# Patient Record
Sex: Female | Born: 1940
Health system: Southern US, Community
[De-identification: ages and names within clinical notes are randomized; demographics above are authoritative.]

## PROBLEM LIST (undated history)

## (undated) DIAGNOSIS — N3090 Cystitis, unspecified without hematuria: Secondary | ICD-10-CM

## (undated) DIAGNOSIS — C50911 Malignant neoplasm of unspecified site of right female breast: Secondary | ICD-10-CM

## (undated) DIAGNOSIS — F419 Anxiety disorder, unspecified: Secondary | ICD-10-CM

## (undated) DIAGNOSIS — R55 Syncope and collapse: Secondary | ICD-10-CM

## (undated) DIAGNOSIS — M1990 Unspecified osteoarthritis, unspecified site: Secondary | ICD-10-CM

## (undated) DIAGNOSIS — I341 Nonrheumatic mitral (valve) prolapse: Secondary | ICD-10-CM

## (undated) DIAGNOSIS — E079 Disorder of thyroid, unspecified: Secondary | ICD-10-CM

## (undated) DIAGNOSIS — R112 Nausea with vomiting, unspecified: Secondary | ICD-10-CM

## (undated) DIAGNOSIS — R42 Dizziness and giddiness: Secondary | ICD-10-CM

## (undated) DIAGNOSIS — M818 Other osteoporosis without current pathological fracture: Secondary | ICD-10-CM

## (undated) DIAGNOSIS — T50905A Adverse effect of unspecified drugs, medicaments and biological substances, initial encounter: Principal | ICD-10-CM

## (undated) DIAGNOSIS — Z803 Family history of malignant neoplasm of breast: Secondary | ICD-10-CM

## (undated) DIAGNOSIS — T7840XA Allergy, unspecified, initial encounter: Secondary | ICD-10-CM

## (undated) DIAGNOSIS — I10 Essential (primary) hypertension: Secondary | ICD-10-CM

## (undated) DIAGNOSIS — Z9889 Other specified postprocedural states: Secondary | ICD-10-CM

## (undated) DIAGNOSIS — C50919 Malignant neoplasm of unspecified site of unspecified female breast: Secondary | ICD-10-CM

## (undated) DIAGNOSIS — C801 Malignant (primary) neoplasm, unspecified: Secondary | ICD-10-CM

## (undated) DIAGNOSIS — E785 Hyperlipidemia, unspecified: Secondary | ICD-10-CM

## (undated) DIAGNOSIS — H269 Unspecified cataract: Secondary | ICD-10-CM

## (undated) DIAGNOSIS — K219 Gastro-esophageal reflux disease without esophagitis: Secondary | ICD-10-CM

## (undated) DIAGNOSIS — Z460 Encounter for fitting and adjustment of spectacles and contact lenses: Secondary | ICD-10-CM

## (undated) DIAGNOSIS — I890 Lymphedema, not elsewhere classified: Secondary | ICD-10-CM

## (undated) HISTORY — DX: Gastro-esophageal reflux disease without esophagitis: K21.9

## (undated) HISTORY — PX: MASTECTOMY: SHX3

## (undated) HISTORY — DX: Malignant neoplasm of unspecified site of right female breast: C50.911

## (undated) HISTORY — PX: UPPER GASTROINTESTINAL ENDOSCOPY: SHX188

## (undated) HISTORY — PX: KNEE ARTHROSCOPY: SHX127

## (undated) HISTORY — PX: BREAST LUMPECTOMY: SHX2

## (undated) HISTORY — PX: FOOT NEUROMA SURGERY: SHX646

## (undated) HISTORY — DX: Nonrheumatic mitral (valve) prolapse: I34.1

## (undated) HISTORY — PX: FOOT AMPUTATION: SHX951

## (undated) HISTORY — DX: Unspecified osteoarthritis, unspecified site: M19.90

## (undated) HISTORY — DX: Hyperlipidemia, unspecified: E78.5

## (undated) HISTORY — DX: Essential (primary) hypertension: I10

## (undated) HISTORY — DX: Cystitis, unspecified without hematuria: N30.90

## (undated) HISTORY — DX: Other osteoporosis without current pathological fracture: M81.8

## (undated) HISTORY — DX: Allergy, unspecified, initial encounter: T78.40XA

## (undated) HISTORY — DX: Disorder of thyroid, unspecified: E07.9

## (undated) HISTORY — DX: Malignant neoplasm of unspecified site of unspecified female breast: C50.919

## (undated) HISTORY — PX: MASTECTOMY, RADICAL: SHX710

## (undated) HISTORY — DX: Adverse effect of unspecified drugs, medicaments and biological substances, initial encounter: T50.905A

## (undated) HISTORY — DX: Malignant (primary) neoplasm, unspecified: C80.1

## (undated) HISTORY — DX: Lymphedema, not elsewhere classified: I89.0

## (undated) HISTORY — DX: Family history of malignant neoplasm of breast: Z80.3

## (undated) HISTORY — PX: CATARACT EXTRACTION, BILATERAL: SHX1313

## (undated) HISTORY — PX: THYROIDECTOMY, PARTIAL: SHX18

## (undated) HISTORY — DX: Unspecified cataract: H26.9

---
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
## 1990-07-16 DIAGNOSIS — C50919 Malignant neoplasm of unspecified site of unspecified female breast: Secondary | ICD-10-CM

## 1990-07-16 HISTORY — DX: Malignant neoplasm of unspecified site of unspecified female breast: C50.919

## 1999-04-01 ENCOUNTER — Other Ambulatory Visit: Admission: RE | Admit: 1999-04-01 | Discharge: 1999-04-01 | Payer: Self-pay | Admitting: Obstetrics & Gynecology

## 1999-12-25 ENCOUNTER — Encounter: Admission: RE | Admit: 1999-12-25 | Discharge: 1999-12-25 | Payer: Self-pay | Admitting: Oncology

## 1999-12-25 ENCOUNTER — Encounter: Payer: Self-pay | Admitting: Oncology

## 2000-03-25 ENCOUNTER — Other Ambulatory Visit: Admission: RE | Admit: 2000-03-25 | Discharge: 2000-03-25 | Payer: Self-pay | Admitting: Obstetrics and Gynecology

## 2000-04-08 ENCOUNTER — Ambulatory Visit (HOSPITAL_COMMUNITY): Admission: RE | Admit: 2000-04-08 | Discharge: 2000-04-08 | Payer: Self-pay | Admitting: Orthopedic Surgery

## 2000-09-10 ENCOUNTER — Ambulatory Visit (HOSPITAL_COMMUNITY): Admission: RE | Admit: 2000-09-10 | Discharge: 2000-09-10 | Payer: Self-pay | Admitting: *Deleted

## 2000-12-28 ENCOUNTER — Encounter: Payer: Self-pay | Admitting: Oncology

## 2000-12-28 ENCOUNTER — Encounter: Admission: RE | Admit: 2000-12-28 | Discharge: 2000-12-28 | Payer: Self-pay | Admitting: Oncology

## 2001-03-30 ENCOUNTER — Other Ambulatory Visit: Admission: RE | Admit: 2001-03-30 | Discharge: 2001-03-30 | Payer: Self-pay | Admitting: Obstetrics and Gynecology

## 2001-06-11 ENCOUNTER — Encounter (INDEPENDENT_AMBULATORY_CARE_PROVIDER_SITE_OTHER): Payer: Self-pay | Admitting: Specialist

## 2001-06-11 ENCOUNTER — Other Ambulatory Visit: Admission: RE | Admit: 2001-06-11 | Discharge: 2001-06-11 | Payer: Self-pay | Admitting: Obstetrics and Gynecology

## 2001-12-27 ENCOUNTER — Encounter: Admission: RE | Admit: 2001-12-27 | Discharge: 2001-12-27 | Payer: Self-pay | Admitting: Oncology

## 2001-12-27 ENCOUNTER — Encounter: Payer: Self-pay | Admitting: Oncology

## 2002-04-04 ENCOUNTER — Other Ambulatory Visit: Admission: RE | Admit: 2002-04-04 | Discharge: 2002-04-04 | Payer: Self-pay | Admitting: Obstetrics and Gynecology

## 2003-04-10 ENCOUNTER — Other Ambulatory Visit: Admission: RE | Admit: 2003-04-10 | Discharge: 2003-04-10 | Payer: Self-pay | Admitting: Obstetrics and Gynecology

## 2004-04-09 ENCOUNTER — Other Ambulatory Visit: Admission: RE | Admit: 2004-04-09 | Discharge: 2004-04-09 | Payer: Self-pay | Admitting: Obstetrics and Gynecology

## 2004-11-17 HISTORY — PX: COLONOSCOPY: SHX174

## 2004-12-23 ENCOUNTER — Ambulatory Visit: Payer: Self-pay | Admitting: Oncology

## 2005-04-15 ENCOUNTER — Other Ambulatory Visit: Admission: RE | Admit: 2005-04-15 | Discharge: 2005-04-15 | Payer: Self-pay | Admitting: Obstetrics and Gynecology

## 2005-07-25 ENCOUNTER — Inpatient Hospital Stay (HOSPITAL_COMMUNITY): Admission: EM | Admit: 2005-07-25 | Discharge: 2005-07-28 | Payer: Self-pay | Admitting: Emergency Medicine

## 2005-10-17 ENCOUNTER — Ambulatory Visit (HOSPITAL_COMMUNITY): Admission: RE | Admit: 2005-10-17 | Discharge: 2005-10-17 | Payer: Self-pay | Admitting: *Deleted

## 2005-10-17 ENCOUNTER — Encounter (INDEPENDENT_AMBULATORY_CARE_PROVIDER_SITE_OTHER): Payer: Self-pay | Admitting: Specialist

## 2005-12-22 ENCOUNTER — Ambulatory Visit: Payer: Self-pay | Admitting: Oncology

## 2006-04-16 ENCOUNTER — Emergency Department (HOSPITAL_COMMUNITY): Admission: EM | Admit: 2006-04-16 | Discharge: 2006-04-16 | Payer: Self-pay | Admitting: Emergency Medicine

## 2006-12-17 ENCOUNTER — Ambulatory Visit: Payer: Self-pay | Admitting: Oncology

## 2006-12-22 LAB — CBC WITH DIFFERENTIAL/PLATELET
Eosinophils Absolute: 0.1 10*3/uL (ref 0.0–0.5)
HCT: 33.9 % — ABNORMAL LOW (ref 34.8–46.6)
LYMPH%: 30.4 % (ref 14.0–48.0)
MONO#: 0.3 10*3/uL (ref 0.1–0.9)
NEUT#: 4.1 10*3/uL (ref 1.5–6.5)
NEUT%: 63.3 % (ref 39.6–76.8)
Platelets: 171 10*3/uL (ref 145–400)
WBC: 6.4 10*3/uL (ref 3.9–10.0)

## 2006-12-22 LAB — COMPREHENSIVE METABOLIC PANEL
CO2: 26 mEq/L (ref 19–32)
Calcium: 9.3 mg/dL (ref 8.4–10.5)
Chloride: 106 mEq/L (ref 96–112)
Creatinine, Ser: 1.2 mg/dL (ref 0.40–1.20)
Glucose, Bld: 130 mg/dL — ABNORMAL HIGH (ref 70–99)
Sodium: 142 mEq/L (ref 135–145)
Total Bilirubin: 0.4 mg/dL (ref 0.3–1.2)
Total Protein: 6.7 g/dL (ref 6.0–8.3)

## 2006-12-22 LAB — LACTATE DEHYDROGENASE: LDH: 143 U/L (ref 94–250)

## 2007-05-27 ENCOUNTER — Emergency Department (HOSPITAL_COMMUNITY): Admission: EM | Admit: 2007-05-27 | Discharge: 2007-05-27 | Payer: Self-pay | Admitting: Emergency Medicine

## 2007-12-31 ENCOUNTER — Ambulatory Visit: Payer: Self-pay | Admitting: Oncology

## 2008-01-04 LAB — CBC WITH DIFFERENTIAL/PLATELET
Basophils Absolute: 0.1 10*3/uL (ref 0.0–0.1)
Eosinophils Absolute: 0.1 10*3/uL (ref 0.0–0.5)
HGB: 12.2 g/dL (ref 11.6–15.9)
LYMPH%: 24.6 % (ref 14.0–48.0)
MCV: 89.3 fL (ref 81.0–101.0)
MONO%: 5.3 % (ref 0.0–13.0)
NEUT#: 4.5 10*3/uL (ref 1.5–6.5)
Platelets: 188 10*3/uL (ref 145–400)
RBC: 4.01 10*6/uL (ref 3.70–5.32)

## 2008-01-04 LAB — COMPREHENSIVE METABOLIC PANEL
Alkaline Phosphatase: 46 U/L (ref 39–117)
BUN: 30 mg/dL — ABNORMAL HIGH (ref 6–23)
Creatinine, Ser: 1.36 mg/dL — ABNORMAL HIGH (ref 0.40–1.20)
Glucose, Bld: 117 mg/dL — ABNORMAL HIGH (ref 70–99)
Total Bilirubin: 0.6 mg/dL (ref 0.3–1.2)

## 2008-01-27 ENCOUNTER — Encounter: Admission: RE | Admit: 2008-01-27 | Discharge: 2008-01-27 | Payer: Self-pay | Admitting: Radiology

## 2008-02-16 DIAGNOSIS — C50911 Malignant neoplasm of unspecified site of right female breast: Secondary | ICD-10-CM

## 2008-02-16 HISTORY — DX: Malignant neoplasm of unspecified site of right female breast: C50.911

## 2008-03-02 ENCOUNTER — Encounter: Admission: RE | Admit: 2008-03-02 | Discharge: 2008-03-02 | Payer: Self-pay | Admitting: General Surgery

## 2008-03-06 ENCOUNTER — Encounter (INDEPENDENT_AMBULATORY_CARE_PROVIDER_SITE_OTHER): Payer: Self-pay | Admitting: General Surgery

## 2008-03-06 ENCOUNTER — Ambulatory Visit (HOSPITAL_BASED_OUTPATIENT_CLINIC_OR_DEPARTMENT_OTHER): Admission: RE | Admit: 2008-03-06 | Discharge: 2008-03-06 | Payer: Self-pay | Admitting: General Surgery

## 2008-03-23 ENCOUNTER — Ambulatory Visit (HOSPITAL_COMMUNITY): Admission: RE | Admit: 2008-03-23 | Discharge: 2008-03-24 | Payer: Self-pay | Admitting: General Surgery

## 2008-03-23 ENCOUNTER — Encounter (INDEPENDENT_AMBULATORY_CARE_PROVIDER_SITE_OTHER): Payer: Self-pay | Admitting: General Surgery

## 2008-04-05 ENCOUNTER — Ambulatory Visit: Payer: Self-pay | Admitting: Oncology

## 2008-05-22 ENCOUNTER — Ambulatory Visit: Payer: Self-pay | Admitting: Oncology

## 2008-06-20 LAB — COMPREHENSIVE METABOLIC PANEL
AST: 19 U/L (ref 0–37)
Albumin: 4.1 g/dL (ref 3.5–5.2)
Alkaline Phosphatase: 51 U/L (ref 39–117)
BUN: 26 mg/dL — ABNORMAL HIGH (ref 6–23)
Potassium: 4.3 mEq/L (ref 3.5–5.3)
Sodium: 140 mEq/L (ref 135–145)

## 2008-06-20 LAB — CBC WITH DIFFERENTIAL/PLATELET
BASO%: 0.5 % (ref 0.0–2.0)
Basophils Absolute: 0 10*3/uL (ref 0.0–0.1)
EOS%: 1.9 % (ref 0.0–7.0)
MCH: 30.4 pg (ref 26.0–34.0)
MCHC: 33.9 g/dL (ref 32.0–36.0)
MCV: 89.7 fL (ref 81.0–101.0)
MONO%: 4.4 % (ref 0.0–13.0)
RBC: 3.91 10*6/uL (ref 3.70–5.32)
RDW: 13.1 % (ref 11.3–14.5)

## 2008-07-02 ENCOUNTER — Emergency Department (HOSPITAL_COMMUNITY): Admission: EM | Admit: 2008-07-02 | Discharge: 2008-07-02 | Payer: Self-pay | Admitting: Emergency Medicine

## 2008-08-17 ENCOUNTER — Ambulatory Visit: Payer: Self-pay | Admitting: Oncology

## 2008-09-06 LAB — BASIC METABOLIC PANEL
BUN: 24 mg/dL — ABNORMAL HIGH (ref 6–23)
CO2: 29 mEq/L (ref 19–32)
Chloride: 106 mEq/L (ref 96–112)
Creatinine, Ser: 1.15 mg/dL (ref 0.40–1.20)
Glucose, Bld: 117 mg/dL — ABNORMAL HIGH (ref 70–99)

## 2009-01-08 ENCOUNTER — Ambulatory Visit: Payer: Self-pay | Admitting: Oncology

## 2009-01-10 LAB — CBC WITH DIFFERENTIAL/PLATELET
BASO%: 0.6 % (ref 0.0–2.0)
EOS%: 1.7 % (ref 0.0–7.0)
HCT: 36.1 % (ref 34.8–46.6)
LYMPH%: 27 % (ref 14.0–49.7)
MCH: 30.6 pg (ref 25.1–34.0)
MCHC: 34.1 g/dL (ref 31.5–36.0)
NEUT%: 63.5 % (ref 38.4–76.8)
Platelets: 179 10*3/uL (ref 145–400)
RBC: 4.03 10*6/uL (ref 3.70–5.45)
lymph#: 1.8 10*3/uL (ref 0.9–3.3)

## 2009-01-10 LAB — COMPREHENSIVE METABOLIC PANEL
ALT: 16 U/L (ref 0–35)
AST: 19 U/L (ref 0–37)
Creatinine, Ser: 1.26 mg/dL — ABNORMAL HIGH (ref 0.40–1.20)
Sodium: 140 mEq/L (ref 135–145)
Total Bilirubin: 0.5 mg/dL (ref 0.3–1.2)

## 2009-03-05 ENCOUNTER — Ambulatory Visit: Payer: Self-pay | Admitting: Oncology

## 2009-04-13 ENCOUNTER — Ambulatory Visit: Payer: Self-pay | Admitting: Oncology

## 2009-04-18 LAB — CBC WITH DIFFERENTIAL/PLATELET
BASO%: 0.6 % (ref 0.0–2.0)
HCT: 33.3 % — ABNORMAL LOW (ref 34.8–46.6)
LYMPH%: 26.3 % (ref 14.0–49.7)
MCHC: 34.4 g/dL (ref 31.5–36.0)
MCV: 90 fL (ref 79.5–101.0)
MONO#: 0.5 10*3/uL (ref 0.1–0.9)
MONO%: 7.1 % (ref 0.0–14.0)
NEUT%: 64.8 % (ref 38.4–76.8)
Platelets: 176 10*3/uL (ref 145–400)
RBC: 3.7 10*6/uL (ref 3.70–5.45)
WBC: 7.2 10*3/uL (ref 3.9–10.3)

## 2009-04-18 LAB — COMPREHENSIVE METABOLIC PANEL
ALT: 14 U/L (ref 0–35)
AST: 19 U/L (ref 0–37)
Albumin: 4.1 g/dL (ref 3.5–5.2)
Alkaline Phosphatase: 41 U/L (ref 39–117)
Potassium: 4.6 mEq/L (ref 3.5–5.3)
Sodium: 140 mEq/L (ref 135–145)
Total Bilirubin: 0.6 mg/dL (ref 0.3–1.2)
Total Protein: 6.7 g/dL (ref 6.0–8.3)

## 2009-08-06 ENCOUNTER — Encounter: Admission: RE | Admit: 2009-08-06 | Discharge: 2009-08-21 | Payer: Self-pay | Admitting: General Surgery

## 2009-10-19 ENCOUNTER — Ambulatory Visit: Payer: Self-pay | Admitting: Oncology

## 2009-10-23 LAB — CBC WITH DIFFERENTIAL/PLATELET
Basophils Absolute: 0 10*3/uL (ref 0.0–0.1)
Eosinophils Absolute: 0.1 10*3/uL (ref 0.0–0.5)
HGB: 12.3 g/dL (ref 11.6–15.9)
MCV: 93.6 fL (ref 79.5–101.0)
MONO#: 0.4 10*3/uL (ref 0.1–0.9)
MONO%: 6.2 % (ref 0.0–14.0)
NEUT#: 3.6 10*3/uL (ref 1.5–6.5)
Platelets: 149 10*3/uL (ref 145–400)
RBC: 3.85 10*6/uL (ref 3.70–5.45)
RDW: 13.6 % (ref 11.2–14.5)
WBC: 5.8 10*3/uL (ref 3.9–10.3)

## 2009-10-23 LAB — COMPREHENSIVE METABOLIC PANEL
Albumin: 4.3 g/dL (ref 3.5–5.2)
Alkaline Phosphatase: 35 U/L — ABNORMAL LOW (ref 39–117)
BUN: 24 mg/dL — ABNORMAL HIGH (ref 6–23)
CO2: 27 mEq/L (ref 19–32)
Calcium: 10 mg/dL (ref 8.4–10.5)
Glucose, Bld: 108 mg/dL — ABNORMAL HIGH (ref 70–99)
Potassium: 4.3 mEq/L (ref 3.5–5.3)
Sodium: 141 mEq/L (ref 135–145)
Total Protein: 6.9 g/dL (ref 6.0–8.3)

## 2009-10-23 LAB — LACTATE DEHYDROGENASE: LDH: 176 U/L (ref 94–250)

## 2010-01-28 ENCOUNTER — Encounter: Admission: RE | Admit: 2010-01-28 | Discharge: 2010-01-28 | Payer: Self-pay | Admitting: Oncology

## 2010-05-01 ENCOUNTER — Ambulatory Visit: Payer: Self-pay | Admitting: Oncology

## 2010-05-03 LAB — BASIC METABOLIC PANEL
BUN: 32 mg/dL — ABNORMAL HIGH (ref 6–23)
CO2: 24 mEq/L (ref 19–32)
Calcium: 9.7 mg/dL (ref 8.4–10.5)
Chloride: 106 mEq/L (ref 96–112)
Creatinine, Ser: 1.3 mg/dL — ABNORMAL HIGH (ref 0.40–1.20)
Glucose, Bld: 98 mg/dL (ref 70–99)
Potassium: 4.5 mEq/L (ref 3.5–5.3)
Sodium: 142 mEq/L (ref 135–145)

## 2010-10-17 ENCOUNTER — Ambulatory Visit: Payer: Self-pay | Admitting: Oncology

## 2010-10-21 LAB — CBC WITH DIFFERENTIAL/PLATELET
BASO%: 0.6 % (ref 0.0–2.0)
Basophils Absolute: 0 10*3/uL (ref 0.0–0.1)
EOS%: 1.9 % (ref 0.0–7.0)
Eosinophils Absolute: 0.1 10*3/uL (ref 0.0–0.5)
HCT: 35 % (ref 34.8–46.6)
HGB: 12 g/dL (ref 11.6–15.9)
LYMPH%: 28.9 % (ref 14.0–49.7)
MCH: 31.8 pg (ref 25.1–34.0)
MCHC: 34.3 g/dL (ref 31.5–36.0)
MCV: 92.7 fL (ref 79.5–101.0)
MONO#: 0.5 10*3/uL (ref 0.1–0.9)
MONO%: 7.9 % (ref 0.0–14.0)
NEUT#: 3.8 10*3/uL (ref 1.5–6.5)
NEUT%: 60.7 % (ref 38.4–76.8)
Platelets: 165 10*3/uL (ref 145–400)
RBC: 3.78 10*6/uL (ref 3.70–5.45)
RDW: 13.3 % (ref 11.2–14.5)
WBC: 6.3 10*3/uL (ref 3.9–10.3)
lymph#: 1.8 10*3/uL (ref 0.9–3.3)

## 2010-11-15 LAB — BASIC METABOLIC PANEL
BUN: 25 mg/dL — ABNORMAL HIGH (ref 6–23)
CO2: 26 mEq/L (ref 19–32)
Glucose, Bld: 94 mg/dL (ref 70–99)
Potassium: 4.3 mEq/L (ref 3.5–5.3)

## 2011-01-16 ENCOUNTER — Encounter (INDEPENDENT_AMBULATORY_CARE_PROVIDER_SITE_OTHER): Payer: Self-pay | Admitting: *Deleted

## 2011-01-23 NOTE — Letter (Signed)
Summary: Pre Visit Letter Revised  [facility]  [address] [place], [place] [address]   Phone: [phone]  Fax: [phone]        01/16/2011 MRN: [id number] Jennifer Yang [address] [place], [place]  [address]             Procedure Date:  02-27-11           Direct Colon--Dr. Russella Dar   Welcome to the Gastroenterology Division at [facility].    You are scheduled to see a nurse for your pre-procedure visit on 02-13-11 at 10:00a.m. on the 3rd floor at [facility], [address]  We ask that you try to arrive at our office 15 minutes prior to your appointment time to allow for check-in.  Please take a minute to review the attached form.  If you answer "Yes" to one or more of the questions on the first page, we ask that you call the person listed at your earliest opportunity.  If you answer "No" to all of the questions, please complete the rest of the form and bring it to your appointment.    Your nurse visit will consist of discussing your medical and surgical history, your immediate family medical history, and your medications.   If you are unable to list all of your medications on the form, please bring the medication bottles to your appointment and we will list them.  We will need to be aware of both prescribed and over the counter drugs.  We will need to know exact dosage information as well.    Please be prepared to read and sign documents such as consent forms, a financial agreement, and acknowledgement forms.  If necessary, and with your consent, a friend or relative is welcome to sit-in on the nurse visit with you.  Please bring your insurance card so that we may make a copy of it.  If your insurance requires a referral to see a specialist, please bring your referral form from your primary care physician.  No co-pay is required for this nurse visit.     If you cannot keep your appointment, please call [phone] to cancel or reschedule prior to your  appointment date.  This allows [place] the opportunity to schedule an appointment for another patient in need of care.    Thank you for choosing [facility] for your medical needs.  We appreciate the opportunity to care for you.  Please visit [place] at our website  to learn more about our practice.  Sincerely, The Gastroenterology Division

## 2011-02-13 ENCOUNTER — Telehealth: Payer: Self-pay | Admitting: *Deleted

## 2011-02-13 ENCOUNTER — Ambulatory Visit (AMBULATORY_SURGERY_CENTER): Payer: Medicare Other | Admitting: *Deleted

## 2011-02-13 VITALS — Ht 69.0 in | Wt 219.0 lb

## 2011-02-13 DIAGNOSIS — Z1211 Encounter for screening for malignant neoplasm of colon: Secondary | ICD-10-CM

## 2011-02-13 MED ORDER — PEG-KCL-NACL-NASULF-NA ASC-C 100 G PO SOLR: 1.0000 | Freq: Once | ORAL | Status: AC

## 2011-02-13 NOTE — Telephone Encounter (Signed)
Pt. Notified that her colon is not due until 2016/ Dr. Ardell Isaacs advice.  Pt. States she does hemoccult stools annually with her Gyn doctor.

## 2011-02-13 NOTE — Telephone Encounter (Signed)
The current guidelines for colorectal cancer screening for average risk patients with no symptoms is every 10 years, which would be 10/2015. We, or her PCP, could order stool hemmocults and if they are positive a colonoscopy would be indicated.

## 2011-02-13 NOTE — Telephone Encounter (Signed)
Dr. Russella Dar, Ms. Jennifer Yang had a colon in 2006 with Dr. Virginia Rochester.  Since then she's had a radical mastectomy with lymph node removal.  She had a lumpectomy with chemo and radiation on the same breast in 2001.  My question is should she have one now?  Her last 2 colon were normal and no family history.  Please advise.  Dr. Wende Neighbors procedures are on your desk.

## 2011-02-13 NOTE — Progress Notes (Signed)
Right arm lymphedema.  No blood pressure or IV's in right arm

## 2011-02-27 ENCOUNTER — Other Ambulatory Visit: Payer: Self-pay | Admitting: Gastroenterology

## 2011-03-28 ENCOUNTER — Encounter (INDEPENDENT_AMBULATORY_CARE_PROVIDER_SITE_OTHER): Payer: Self-pay | Admitting: General Surgery

## 2011-04-01 NOTE — Op Note (Signed)
Jennifer Yang, Jennifer Yang                 ACCOUNT NO.:  [id number]   MEDICAL RECORD NO.:  [id number]          PATIENT TYPE:  AMB   LOCATION:  DSC                          FACILITY:  [facility]   PHYSICIAN:  Angelia Mould. Derrell Lolling, M.D.DATE OF BIRTH:  1941-11-02   DATE OF PROCEDURE:  03/06/2008  DATE OF DISCHARGE:                               OPERATIVE REPORT   PREOPERATIVE DIAGNOSIS:  History right breast cancer, possible local  recurrence.   POSTOPERATIVE DIAGNOSIS:  History right breast cancer, possible local  recurrence.   OPERATION PERFORMED:  Excision of right breast tissue with needle  localization and specimen mammograms.   SURGEON:  Angelia Mould. Derrell Lolling, M.D.   OPERATIVE INDICATIONS:  This is a 70 year old white female who underwent  a right partial mastectomy and right axillary lymph node dissection in  1991 for a stage T1c N0, receptor positive tumor.  She has had no known  recurrence today.  Recent mammograms showed some stable architectural  distortion at the lumpectomy site.  She had subsequent mammograms,  ultrasound, MRI, and BSGI.  The only abnormality was some abnormal  uptake in the lumpectomy bed on the BSGI and some linear enhancement  within the lumpectomy bed on MRI.  A core biopsy was performed and there  was atypical ductal proliferation with findings that were worrisome for  a small focus of recurrent breast cancer.  The core biopsy was not  diagnostic.  She was evaluated as an outpatient and advised to have this  area excised for diagnostic purposes.  She is aware that if she has  recurrent cancer she will most likely need a mastectomy.  She underwent  needle localization at [facility] and is brought to operating  room today.   OPERATIVE TECHNIQUE:  Needle localization was performed this morning by  Dr. [facility].  The wire went through the area of the titanium  wire clips and went about 3 cm beyond.  The images showed the wire to be  through the area of  architectural distortion and wire clips, which is  directly behind the lumpectomy site at about the 6:30 p.m. position.  The patient was then brought to operating room.  General anesthesia was  induced.  The right breast was prepped and draped in a sterile fashion.  A 0.5% Marcaine with epinephrine was used as a local infiltration  anesthetic.   I observed the curved circumareolar incision in the lower breast.  There  was some deformity here.  I conservatively excised the incision and then  took the dissection down and around the wire tip.  I took the specimen  out and marked with silk sutures to mark the superior and lateral  margins.  Using the Faxitron, I took an image and found that I had both  titanium clips within the center of the specimen.  The specimen was then  sent to Dr. Cherlyn Labella office and she also did a specimen mammogram and  she called back and told me that I had good excision of the area.  The  specimen sent for routine histology.  The wound was  irrigated with  saline.  Hemostasis was excellent.  The deeper  tissues were closed with interrupted sutures of 3-0 Vicryl and skin  closed with running subcuticular suture of 4-0 Monocryl and Dermabond.  Clean bandage was placed and the patient taken recovery room in stable  condition.  Estimated blood loss was about 10 mL.  Complications, none.  Sponge, needle, and instrument counts were correct.      Angelia Mould. Derrell Lolling, M.D.  Electronically Signed     HMI/MEDQ  D:  03/06/2008  T:  03/07/2008  Job:  [id number]   cc:   Genene Churn. Cyndie Chime, M.D.

## 2011-04-01 NOTE — Op Note (Signed)
NAMESHEKETA, ENDE                 ACCOUNT NO.:  [id number]   MEDICAL RECORD NO.:  [id number]          PATIENT TYPE:  OIB   LOCATION:  6743                         FACILITY:  [facility]   PHYSICIAN:  Angelia Mould. Derrell Lolling, M.D.DATE OF BIRTH:  December 21, 1940   DATE OF PROCEDURE:  03/23/2008  DATE OF DISCHARGE:                               OPERATIVE REPORT   PREOPERATIVE DIAGNOSIS:  Recurrent cancer, right breast.   POSTOPERATIVE DIAGNOSIS:  Recurrent cancer, right breast.   OPERATION PERFORMED:  Right total mastectomy.   SURGEON:  Angelia Mould. Derrell Lolling, MD   FIRST ASSISTANT:  Anselm Pancoast. Zachery Dakins, MD   OPERATIVE INDICATIONS:  This is a 70 year old white female who underwent  a right partial mastectomy and right axillary lymph node dissection in  1991, for what turned out to be a stage T1C, N0, receptor positive  tumor.  She had adjuvant radiation therapy.  She has had no known  recurrence to date.  Recent mammogram showed stable architectural  distortion of the lumpectomy site and a 7-mm mass in the low axillary  region just anterior to the pectoralis.  This was thought to be a lymph  node, but nevertheless she went onto MRI and BSGI.  On the BSGI, there  was abnormal uptake in the lumpectomy bed extending in a line to the  nipple posteriorly for about 3 cm.  The MRI looked similar.  She had a  core biopsy and a clip placed and this showed atypical ductal  proliferation, worrisome for malignancy and excisional biopsy was  strongly recommended.  At that point, the patient was sent to me.  She  was taken to the operating room on March 06, 2008, for a needle-  localized excisional biopsy.  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  She was counseled  and advised to undergo of right mastectomy.  This has been discussed in  detail as an outpatient and she is brought to the hospital  electively  for that.   OPERATIVE TECHNIQUE:  Following induction of general endotracheal  anesthesia, the patient's right breast, shoulder, chest wall, and axilla  were prepped and draped in a sterile fashion.  Intravenous antibiotics  were given.  The patient was identified as correct patient and correct  procedure and correct site.  A transverse elliptical incision was made  extending well above the areola superiorly and well below the curved  transverse incision at the 7 o'clock position.  This took the incision  about 1 cm below the previous lumpectomy scar and biopsy site and took  it all the way down to the inframammary crease and a little bit below.  The transverse elliptical incision was made.  Skin flaps were raised  superiorly to below the clavicle, medially to the parasternal area,  inferiorly we are very careful to stay well away from biopsy cavity all  the way down to the anterior rectus sheath, and laterally to the  latissimus dorsi muscle.  The biopsy cavity was thin and extended  all  the way down to the pectoralis muscle at the inferior border.  The  breast was dissected off of the underlying pectoralis major and minor  using electrocautery.  We got to near the biopsy cavity site.  We went  deeper into the muscle to take more tissue.  There was no sign of any  cancer anywhere.  We continued the dissection, took the breast off  including the tail of Spence.  We marked the tail of Spence with a  suture and marked the inferior border margin with a suture.  Although,  the biopsy cavity was disrupted and that will be somewhat distorted in  the specimen.  I felt that clinically, we got widely around this area  without any concern for positive margin grossly.  The wound was  irrigated with saline.  Hemostasis was excellent and achieved with  electrocautery.  Two 19-French Blake drains were placed, one up into the  axillary area and one across the skin flaps.  These were brought  out  through separate stab incisions in the right lateral chest wall, sutured  the skin with nylon  sutures and connected to suction bulbs.  The skin was closed with skin  staples.  A clean occlusive bandage and Coban dressing was placed.  The  patient tolerated the procedure well and was taken to the recovery room  in stable condition.  Estimated blood loss was about 100 mL.  Complications none.  Sponge, needle, and counts were correct.      Angelia Mould. Derrell Lolling, M.D.  Electronically Signed     HMI/MEDQ  D:  03/23/2008  T:  03/23/2008  Job:  [id number]   cc:   Genene Churn. Cyndie Chime, M.D.  Jaclyn Prime. Lucas Mallow, M.D.

## 2011-04-04 NOTE — Discharge Summary (Signed)
NAMEVIHANA, Jennifer Yang                 ACCOUNT NO.:  [id number]   MEDICAL RECORD NO.:  [id number]          PATIENT TYPE:  INP   LOCATION:  5021                         FACILITY:  [facility]   PHYSICIAN:  Lonia Blood, M.D.DATE OF BIRTH:  06-Mar-1941   DATE OF ADMISSION:  07/25/2005  DATE OF DISCHARGE:  07/28/2005                                 DISCHARGE SUMMARY   DISCHARGE DIAGNOSIS:  1.  Right upper extremity cellulitis.      1.  Status post lymph node dissection that side.      2.  Responded to initial course of intravenous Azithromycin.      3.  Extended course p.o. Azithromycin to be completed in the outpatient          setting.  2.  Status post lumpectomy of the right breast with lymph node dissection.  3.  Prior history of cellulitis right upper extremity.  4.  Status post thyroidectomy for goiter.  5.  Status post surgery for Morton's neuroma and hammer toe.  6.  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 Lucas Mallow.   DISCHARGE MEDICATIONS:  1.  Tenormin 50 mg daily.  2.  Hyzaar 50 mg daily.  3.  E-Vista.  4.  Aspirin 81 mg daily.  5.  Multi-vitamin p.o. daily.  6.  Azithromycin 250 mg p.o. daily x 7 days then stop.   CONSULTATIONS:  None.   PROCEDURES:  None.   FOLLOW UP:  The patient is advised to keep her regularly scheduled follow up  appointments with Dr. Cyndie Chime and Dr. Lucas Mallow.  She is advised to return  to the hospital immediately if she should develop severe swelling of the  right arm, return of severe erythema or systemic fevers and chills.   HISTORY OF PRESENT ILLNESS:  For full details of the history of present  illness, please see dictated history and physical, number [id number], from Dr.  Hannah Yang.   HOSPITAL COURSE:  Jennifer Yang is a very pleasant 70 year old female with  a significant history of right breast lumpectomy and lymph node dissection  in the right axilla.  She presented to the hospital on July 25, 2005,  with  complaints of swelling in the right arm.  On exam, the right arm  displayed marked erythema and calor.  The patient was clinically diagnosed  with cellulitis.  Because of the complicating factor of the patient's  previous lymph node dissection, it was felt that inpatient treatment was  most appropriate.  The patient was placed on an acute unit.  Because of  reported allergies to Rocephin, sulfa, and codeine, the decision was made to  administer Azithromycin.  Azithromycin at 500 mg IV daily was provided on  July 25, 2005, through July 28, 2005.  The patient tolerated this  without any difficulty.  The patient's arm showed progressive improvement.  On the day of discharge, erythema had almost completely resolved but had  significantly receded from the prior marks at presentation as outlined in  blue ink.  The patient no longer had systemic symptoms.  She was  afebrile.  White blood  cell count had decreased to normal at 5.3 from a level of  11.3 at  presentation.  The patient was converted to p.o. Azithromycin and cleared  for discharge home.  She was educated on symptoms of returning cellulitis  and warned to report immediately for evaluation should these occur.      Lonia Blood, M.D.  Electronically Signed     JTM/MEDQ  D:  07/28/2005  T:  07/28/2005  Job:  [id number]   cc:   Jaclyn Prime. Lucas Mallow, M.D.  [address]  [place]  [place] [address]  Fax: [phone]   Genene Churn. Cyndie Chime, M.D.  [address] [facility]  [place]  [place] [address]  Fax: [phone]

## 2011-04-04 NOTE — H&P (Signed)
NAMENEELIE, WELSHANS NO.:  [id number]   MEDICAL RECORD NO.:  [id number]          PATIENT TYPE:  INP   LOCATION:  5021                         FACILITY:  [facility]   PHYSICIAN:  Hettie Holstein, D.O.    DATE OF BIRTH:  [birth year]   DATE OF ADMISSION:  07/25/2005  DATE OF DISCHARGE:                                HISTORY & PHYSICAL   PRIMARY CARE PHYSICIAN:  Jaclyn Prime. Lucas Mallow, M.D.   ONCOLOGIST:  Genene Churn. [place], M.D.   CHIEF COMPLAINT:  Swelling of her right arm and redness.   HISTORY OF PRESENT ILLNESS:  Mrs. Deboy is a pleasant 70 year old Caucasian  female who lives at home with husband. She has a previous history of right  lumpectomy in 1991 for breast cancer, in addition to lymph node resection  and has remained in her usual state of health until today. She had developed  some pain, redness, and erythema of her right upper extremity shortly after  working in her flower beds at home. She cannot recall any trauma or injury.  She presented to [facility] at which time she was given some  Benadryl. Subsequently, she presented to the emergency department at [facility]. She was evaluated by Dr. Doug Sou. He made a clinical  diagnosis of cellulitis. She is being admitted for further management.   PAST MEDICAL HISTORY:  As noted above, status post lumpectomy for breast  cancer. She has had cellulitis of this same arm several years ago for which  she has undergone treatment with Zithromax without problems. History of  partial thyroidectomy for goiter. History of Morton's neuroma and surgery at  that time, in addition to hammertoe. She has some valvular disease and is  followed by Dr. Lucas Mallow with periodic echocardiograms.   MEDICATIONS:  1.  Tenormin 50 mg daily.  2.  Hyzaar 50 mg daily.  3.  Evista.  4.  Multivitamins.  5.  Baby aspirin daily.  6.  Caltrate.   She is uncertain that these are the doses of her medications. She does  use  the [facility] on [facility] which can be contacted in the morning  to confirm. I believe that number is [phone].   ALLERGIES:  In any event, allergies include ROCEPHIN which she developed a  drop in her blood count previously according to her. She has an allergy to  SULFA and CODEINE.   SOCIAL HISTORY:  She does not smoke or drink. She lives with her husband.  They do have an indoor pet, which is a blood hound. She was formerly a  [employer] operational officer.   FAMILY HISTORY:  Her mother died at age 65 with breast cancer and also she  had diabetes and heart disease. Father died at age 37 with a myocardial  infarction. She does have a brother who had a MI at age 44.   REVIEW OF SYSTEMS:  She has had headaches; however, denies any nausea,  vomiting, diarrhea, chest pain, cough, shortness of breath. Some subjective  fevers. No abdominal pain. No swelling of her lower extremities.  PHYSICAL EXAMINATION:  VITAL SIGNS:  Stable in the emergency department with  temperature of 98.9, blood pressure 132/56, heart rate of 89, respirations  18. O2 saturation 96%.  GENERAL:  The patient is alert, oriented, in no acute distress.  NECK:  Supple and nontender. She does have neck line scar post-  thyroidectomy. Her neck is supple, however.  CARDIOVASCULAR:  Normal S1 and S2 without murmur or gallop.  LUNGS:  Clear. She exhibits normal effort and there is no dullness to  percussion.  ABDOMEN:  Soft and nontender. No palpable hepatosplenomegaly.  EXTREMITIES:  Her right upper extremity was warm and erythematous as well as  swollen. The area of erythema was demarcated with a skin marker. Peripheral  pulses were palpable. She had no focal motor or neurologic deficits on exam.   LABORATORY DATA:  From the [facility], a WBC of 11.3, hemoglobin  12.4, platelet count of 135,000.  MCV of 89.   ASSESSMENT:  1.  Cellulitis right upper extremity.  2.  History of breast cancer.  3.  Hypertension.   4.  History of valvular heart disease, not otherwise specified.  5.  Hypothyroidism.  6.  Multiple drug allergies.  7.  Low platelets on outpatient blood work.   PLAN:  We will admit Mrs. Wyeth to medical/surgical floor. Initiate IV  Zithromax. Check a CBC with a differential in the a.m. as well as a basic  metabolic panel and follow her clinical course. May need to change  antibiotics if we do not see regression with Zithromax.      Hettie Holstein, D.O.  Electronically Signed     ESS/MEDQ  D:  07/25/2005  T:  07/26/2005  Job:  [id number]   cc:   Jaclyn Prime. Lucas Mallow, M.D.  [address]  [place]  [place] [address]  Fax: [phone]   Genene Churn. Cyndie Chime, M.D.  [address] [facility]  [place]  [place] [address]  Fax: [phone]

## 2011-04-04 NOTE — Procedures (Signed)
[facility]  Patient:    Jennifer Yang, Jennifer Yang                        MRN: [id number] Proc. Date: 09/10/00 Adm. Date:  [id number] Attending:  Sabino Gasser                           Procedure Report  PROCEDURE:  Colonoscopy.  INDICATIONS:  Change in bowel habits, diarrhea, colon cancer screening.  ANESTHESIA:  Demerol 50 mg, Versed 7 mg.  DESCRIPTION OF PROCEDURE:  With the patient mildly sedated in the left lateral decubitus position, the Olympus videoscopic colonoscope was inserted in the rectum and passed under direct vision to the cecum.  Cecum identified by ileocecal valve and appendiceal orifice, both of which were photographed. From this point, the colonoscope was slowly withdrawn, taking circumferential views of the entire colonic mucosa, stopping only then in the rectum, which appeared normal on direct view, showed small internal hemorrhoids on retroflexed view.  The endoscope was straightened and withdrawn.  The patients vital signs and pulse oximetry remained stable.  The patient tolerated the procedure well without apparent complications.  FINDINGS:  Small internal hemorrhoids, otherwise unremarkable colonoscopic exam to the cecum.  PLAN:  Consider repeat exam possibly in five years. DD:  09/10/00 TD:  09/10/00 Job: [id number]

## 2011-05-14 ENCOUNTER — Other Ambulatory Visit: Payer: Self-pay | Admitting: Oncology

## 2011-05-14 ENCOUNTER — Encounter (HOSPITAL_BASED_OUTPATIENT_CLINIC_OR_DEPARTMENT_OTHER): Payer: Medicare Other | Admitting: Oncology

## 2011-05-14 DIAGNOSIS — Z170 Estrogen receptor positive status [ER+]: Secondary | ICD-10-CM

## 2011-05-14 DIAGNOSIS — C50519 Malignant neoplasm of lower-outer quadrant of unspecified female breast: Secondary | ICD-10-CM

## 2011-05-14 DIAGNOSIS — M949 Disorder of cartilage, unspecified: Secondary | ICD-10-CM

## 2011-05-14 DIAGNOSIS — M899 Disorder of bone, unspecified: Secondary | ICD-10-CM

## 2011-05-14 LAB — BASIC METABOLIC PANEL
BUN: 35 mg/dL — ABNORMAL HIGH (ref 6–23)
Chloride: 107 mEq/L (ref 96–112)
Creatinine, Ser: 1.58 mg/dL — ABNORMAL HIGH (ref 0.50–1.10)
Glucose, Bld: 134 mg/dL — ABNORMAL HIGH (ref 70–99)

## 2011-06-11 ENCOUNTER — Other Ambulatory Visit: Payer: Self-pay | Admitting: Internal Medicine

## 2011-06-11 ENCOUNTER — Ambulatory Visit
Admission: RE | Admit: 2011-06-11 | Discharge: 2011-06-11 | Disposition: A | Discharge status: Home / Self Care | Payer: Medicare Other | Source: Ambulatory Visit | Attending: Internal Medicine | Admitting: Internal Medicine

## 2011-06-11 DIAGNOSIS — R609 Edema, unspecified: Secondary | ICD-10-CM

## 2011-06-23 ENCOUNTER — Ambulatory Visit (INDEPENDENT_AMBULATORY_CARE_PROVIDER_SITE_OTHER): Payer: Medicare Other | Admitting: General Surgery

## 2011-06-30 ENCOUNTER — Encounter (INDEPENDENT_AMBULATORY_CARE_PROVIDER_SITE_OTHER): Payer: Self-pay | Admitting: General Surgery

## 2011-06-30 ENCOUNTER — Ambulatory Visit (INDEPENDENT_AMBULATORY_CARE_PROVIDER_SITE_OTHER): Payer: Medicare Other | Admitting: General Surgery

## 2011-06-30 VITALS — BP 142/78 | HR 62 | Temp 96.8°F | Ht 69.0 in | Wt 208.4 lb

## 2011-06-30 DIAGNOSIS — D1739 Benign lipomatous neoplasm of skin and subcutaneous tissue of other sites: Secondary | ICD-10-CM

## 2011-06-30 DIAGNOSIS — D170 Benign lipomatous neoplasm of skin and subcutaneous tissue of head, face and neck: Secondary | ICD-10-CM

## 2011-06-30 DIAGNOSIS — Z853 Personal history of malignant neoplasm of breast: Secondary | ICD-10-CM

## 2011-06-30 NOTE — Progress Notes (Signed)
Jennifer Yang is a 70 y.o. female.    Chief Complaint  Patient presents with  . Breast Cancer Long Term Follow Up    right mastectomy 2009    HPI HPI This is a 70 year old Caucasian female who returns for a breast cancer followup. She is followed by Dr. Cephas Darby and Dr. Thea Silversmith.  Her initial cancer was stage I, 1.5 cm recepter positive of the right breast, status post lumpectomy July 16, 1990 followed by 6 cycles of CMF chemotherapy and iyears of tamoxifen.  Second primary same breast April 2009, 1.8 cm, ER positive, PR week-positive, HER-2 negative, low-grade but positive lymphovascular invasion. She required right mastectomy for this. She elected not to take any additional chemotherapy. She was started on femara hormonal therapy and has noted no recurrence to date.  I last saw her July 09, 2010 at which time she was doing well without any sign of recurrence.  Most recent mammograms were January 10, 2011, left mammogram, which looks fine no focal abdomen of days.  She did not receive any problems with her breast or mastectomy site.  Past Medical History  Diagnosis Date  . MVP (mitral valve prolapse)   . Allergy     shell fish  . Hyperlipidemia   . Hypertension   . Arthritis     knees  . Cancer     right Chemo/radiation '01/radical '09  . Thyroid disease   . Family history of breast cancer     mother    Past Surgical History  Procedure Date  . Thyroidectomy, partial   . Breast lumpectomy     2001 right  . Mastectomy, radical     2009 right  . Knee arthroscopy     left  . Foot neuroma surgery     left  . Colonoscopy   . Upper gastrointestinal endoscopy   . Foot amputation     right hammer toe    Family History  Problem Relation Age of Onset  . Cancer Mother   . Heart disease Mother   . Heart disease Father   . Heart disease Brother     Social History History  Substance Use Topics  . Smoking status: Never Smoker   . Smokeless tobacco:  Never Used  . Alcohol Use: No    Allergies  Allergen Reactions  . Codeine Sulfate Swelling  . Fish-Derived Products Other (See Comments)    Cellulitis   . Rocephin (Ceftriaxone Sodium) Other (See Comments)    Low blood count  . Sulfa Antibiotics Other (See Comments)    Mouth and lip sores    Current Outpatient Prescriptions  Medication Sig Dispense Refill  . aspirin 81 MG EC tablet Take 81 mg by mouth daily.        . carvedilol (COREG) 12.5 MG tablet Take 12.5 mg by mouth 2 (two) times daily with a meal.        . letrozole (FEMARA) 2.5 MG tablet Take 2.5 mg by mouth daily.        Marland Kitchen lisinopril-hydrochlorothiazide (PRINZIDE,ZESTORETIC) 20-25 MG per tablet Take 1 tablet by mouth daily.        . simvastatin (ZOCOR) 5 MG tablet Take 5 mg by mouth at bedtime.        . Calcium-Vitamin D (CALTRATE 600 PLUS-VIT D PO) Take 1 tablet by mouth daily.        . Cholecalciferol (VITAMIN D) 1000 UNITS capsule Take 1,000 Units by mouth daily.        Marland Kitchen  Cyanocobalamin (B-12) 1000 MCG CAPS Take 1 capsule by mouth daily.        . Multiple Vitamins-Minerals (CENTRUM SILVER PO) Take 1 tablet by mouth daily.          Review of Systems ROS 10 system review of systems is performed and is negative except as described above. Physical Exam Physical Exam   Blood pressure 142/78, pulse 62, temperature 96.8 F (36 C), temperature source Temporal, height 5\' 9"  (1.753 m), weight 208 lb 6.4 oz (94.53 kg).  Patient looks good and is in no distress. Neck anteriorly there is no adenopathy mass or thyromegaly. Posteriorly she has a huge 15 cm lipoma that has been there for many many years.  Lungs clear to auscultation no chest wall tenderness  Heart regular rate and rhythm no murmur no ectopy.  Breasts right mastectomy wound is well-healed no nodules no ulcerations right axilla feels normal. Left breast is soft skin is healthy nipple areolar complexes looked normal no palpable mass no axillary  adenopathy.  Jennifer Yang is a right upper she does have some lymphedema although not excessive. This is been present since her lumpectomy. Assessment/Plan Recurrent invasive carcinoma right breast, status post right total mastectomy, no evidence of recurrence 3 years postop.  Chronic right upper extremity lymphedema, stable.  A large, chronic lipoma posterior neck.  She will see me in one year.  She'll continue followup Dr. Cyndie Chime.  She will get mammograms of February 2013.  She wanted to talk about the lipoma for a few minutes and we did not that hold the involved excising that if she chose to have that removed electively. She's going to think about that.  Jennifer Yang M 06/30/2011, 2:37 PM

## 2011-06-30 NOTE — Patient Instructions (Signed)
Your physical exam shows no evidence of any recurrent cancer on the right, and no abnormalities of the left breast. The large lipoma on your neck has been discussed. Your mammograms of the left breast looked normal. I recommended to continue your followup with Dr. Cephas Darby and that you continue taking the letrozole. I will see you back in my office in one year for a breast check. If you decide that you want the lipoma removed from her neck I would want to do some imaging studies preop. Just let me know.

## 2011-07-17 ENCOUNTER — Encounter (INDEPENDENT_AMBULATORY_CARE_PROVIDER_SITE_OTHER): Payer: Self-pay | Admitting: Oncology

## 2011-08-12 LAB — CBC
HCT: 34.5 — ABNORMAL LOW
Hemoglobin: 12.1
MCHC: 34.9
MCV: 89.8
Platelets: 176
RDW: 12.8

## 2011-08-12 LAB — URINALYSIS, ROUTINE W REFLEX MICROSCOPIC
Ketones, ur: NEGATIVE
Nitrite: NEGATIVE
Urobilinogen, UA: 0.2
pH: 7.5

## 2011-08-12 LAB — DIFFERENTIAL
Basophils Relative: 1
Lymphocytes Relative: 25
Monocytes Absolute: 0.5
Monocytes Relative: 8
Neutro Abs: 3.9
Neutrophils Relative %: 64

## 2011-08-12 LAB — COMPREHENSIVE METABOLIC PANEL
Albumin: 4
BUN: 18
Calcium: 9.9
Creatinine, Ser: 1.2
Glucose, Bld: 118 — ABNORMAL HIGH
Total Protein: 7.3

## 2011-08-12 LAB — URINE MICROSCOPIC-ADD ON

## 2011-10-04 ENCOUNTER — Telehealth: Payer: Self-pay | Admitting: Oncology

## 2011-10-04 NOTE — Telephone Encounter (Signed)
Talked to pt's husband gave him appt date for January. Appt was r/s from November due to [employer]

## 2011-10-24 ENCOUNTER — Other Ambulatory Visit (HOSPITAL_BASED_OUTPATIENT_CLINIC_OR_DEPARTMENT_OTHER): Payer: Medicare Other | Admitting: Lab

## 2011-10-24 ENCOUNTER — Other Ambulatory Visit: Payer: Self-pay | Admitting: Oncology

## 2011-10-24 DIAGNOSIS — M899 Disorder of bone, unspecified: Secondary | ICD-10-CM

## 2011-10-24 DIAGNOSIS — Z170 Estrogen receptor positive status [ER+]: Secondary | ICD-10-CM

## 2011-10-24 DIAGNOSIS — M949 Disorder of cartilage, unspecified: Secondary | ICD-10-CM

## 2011-10-24 DIAGNOSIS — C50519 Malignant neoplasm of lower-outer quadrant of unspecified female breast: Secondary | ICD-10-CM

## 2011-10-24 LAB — CBC WITH DIFFERENTIAL/PLATELET
Eosinophils Absolute: 0.1 10*3/uL (ref 0.0–0.5)
HCT: 34.7 % — ABNORMAL LOW (ref 34.8–46.6)
LYMPH%: 31.9 % (ref 14.0–49.7)
MCV: 90.1 fL (ref 79.5–101.0)
MONO#: 0.6 10*3/uL (ref 0.1–0.9)
MONO%: 9.3 % (ref 0.0–14.0)
NEUT#: 3.7 10*3/uL (ref 1.5–6.5)
NEUT%: 56.2 % (ref 38.4–76.8)
Platelets: 148 10*3/uL (ref 145–400)
WBC: 6.6 10*3/uL (ref 3.9–10.3)

## 2011-10-24 LAB — COMPREHENSIVE METABOLIC PANEL
BUN: 32 mg/dL — ABNORMAL HIGH (ref 6–23)
CO2: 29 mEq/L (ref 19–32)
Calcium: 9.7 mg/dL (ref 8.4–10.5)
Chloride: 106 mEq/L (ref 96–112)
Creatinine, Ser: 1.39 mg/dL — ABNORMAL HIGH (ref 0.50–1.10)
Glucose, Bld: 107 mg/dL — ABNORMAL HIGH (ref 70–99)
Total Bilirubin: 0.5 mg/dL (ref 0.3–1.2)

## 2011-10-24 LAB — LACTATE DEHYDROGENASE: LDH: 153 U/L (ref 94–250)

## 2011-11-13 ENCOUNTER — Other Ambulatory Visit: Payer: Self-pay | Admitting: *Deleted

## 2011-11-13 DIAGNOSIS — C50519 Malignant neoplasm of lower-outer quadrant of unspecified female breast: Secondary | ICD-10-CM

## 2011-11-13 MED ORDER — LETROZOLE 2.5 MG PO TABS: 2.5000 mg | ORAL_TABLET | Freq: Every day | ORAL | Status: DC

## 2011-11-24 ENCOUNTER — Telehealth: Payer: Self-pay | Admitting: Oncology

## 2011-11-24 ENCOUNTER — Encounter: Payer: Self-pay | Admitting: Oncology

## 2011-11-24 ENCOUNTER — Ambulatory Visit (HOSPITAL_BASED_OUTPATIENT_CLINIC_OR_DEPARTMENT_OTHER): Payer: Medicare Other | Admitting: Oncology

## 2011-11-24 VITALS — BP 145/66 | HR 54 | Temp 97.2°F | Ht 69.0 in | Wt 215.5 lb

## 2011-11-24 DIAGNOSIS — I10 Essential (primary) hypertension: Secondary | ICD-10-CM

## 2011-11-24 DIAGNOSIS — I890 Lymphedema, not elsewhere classified: Secondary | ICD-10-CM

## 2011-11-24 DIAGNOSIS — L03113 Cellulitis of right upper limb: Secondary | ICD-10-CM | POA: Insufficient documentation

## 2011-11-24 DIAGNOSIS — C50919 Malignant neoplasm of unspecified site of unspecified female breast: Secondary | ICD-10-CM

## 2011-11-24 DIAGNOSIS — E040 Nontoxic diffuse goiter: Secondary | ICD-10-CM | POA: Insufficient documentation

## 2011-11-24 DIAGNOSIS — IMO0002 Reserved for concepts with insufficient information to code with codable children: Secondary | ICD-10-CM

## 2011-11-24 DIAGNOSIS — C50911 Malignant neoplasm of unspecified site of right female breast: Secondary | ICD-10-CM

## 2011-11-24 HISTORY — DX: Lymphedema, not elsewhere classified: I89.0

## 2011-11-24 HISTORY — DX: Essential (primary) hypertension: I10

## 2011-11-24 NOTE — Telephone Encounter (Signed)
zometa made for 1/14 and 7/11.  Pt to have her mammo in 12/2011 and req to call back with 2014 appt so she can see dr g after mammo next year.  Req him to have report  aom  Pt to call me back  aom

## 2011-11-24 NOTE — Progress Notes (Signed)
Hematology and Oncology Follow Up Visit  Jennifer Yang [id number] 22-Aug-1941 71 y.o. 11/24/2011 7:03 PM   Principle Diagnosis: Encounter Diagnoses  Name Primary?  . Breast cancer, stage 1   . Breast cancer, right breast Yes  . Lymphedema of arm   . Goiter, simple   . Benign essential HTN   . Cellulitis of arm, right      Interim History:   Followup visit for this pleasant 71 year old woman with history of metachronous primary cancers of the right breast. Initial T1 N0 ER positive lesion right lower quadrant treated with lumpectomy 07/16/1990 1.5 cm primary. She received 6 cycles of adjuvant CMF chemotherapy than 2 years of tamoxifen. She developed a second primary in the same breast April 2009 1.8 cm ER positive PR weakly positive HER-2 negative low-grade but positive vascular and lymphatic invasion. She had a right mastectomy at that time. Genetic testing was done and an Oncotype DX gene analysis showed intermediate risk for recurrence but she declined any further chemotherapy. She was started on Femara hormonal therapy and is now been on the drug for 3-1/2 years. She has had problems over the years with intermittent cellulitis of her right arm due to right upper extremity lymphedema which she developed subsequent to her surgeries. No recent flareups She's had no interim medical problems.  Medications: reviewed  Allergies:  Allergies  Allergen Reactions  . Codeine Sulfate Swelling  . Fish-Derived Products Other (See Comments)    Cellulitis   . Rocephin (Ceftriaxone Sodium) Other (See Comments)    Low blood count  . Sulfa Antibiotics Other (See Comments)    Mouth and lip sores    Review of Systems: Constitutional:   Appetite is good no weight loss Respiratory: No dyspnea no cough Cardiovascular:  No chest pain chest pressure or palpitations Gastrointestinal: No abdominal pain no change in bowel habit Genito-Urinary: No vaginal bleeding  Musculoskeletal: No bone pain except  for arthritis of the knees and right hip Neurologic: No headache no change in vision  Skin: No rash Remaining ROS negative.  Physical Exam: Blood pressure 145/66, pulse 54, temperature 97.2 F (36.2 C), temperature source Oral, height 5\' 9"  (1.753 m), weight 215 lb 8 oz (97.75 kg). Wt Readings from Last 3 Encounters:  11/24/11 215 lb 8 oz (97.75 kg)  06/30/11 208 lb 6.4 oz (94.53 kg)  02/13/11 219 lb (99.338 kg)     General appearance: Overweight Caucasian woman Head: Normal Neck: Full range of motion; midline scar status post previous thyroidectomy Lymph nodes: No cervical supraclavicular or axillary adenopathy Breasts: Right mastectomy no chest wall lesions no left breast masses Lungs: Clear to auscultation resonant to percussion Heart: Regular cardiac rhythm no murmur or gallop Abdomen: Soft nontender no mass no organomegaly Extremities: Chronic right upper extremity lymphedema Vascular: No cyanosis Neurologic: Mental status intact cranial nerves grossly normal motor strength 5 over 5 reflexes absent symmetric at the knees 1+ symmetric at the biceps Skin: No rash  Lab Results: Lab Results  Component Value Date   WBC 6.6 10/24/2011   HGB 11.4* 10/24/2011   HCT 34.7* 10/24/2011   MCV 90.1 10/24/2011   PLT 148 10/24/2011     Chemistry      Component Value Date/Time   NA 143 10/24/2011 1240   K 4.2 10/24/2011 1240   CL 106 10/24/2011 1240   CO2 29 10/24/2011 1240   BUN 32* 10/24/2011 1240   CREATININE 1.39* 10/24/2011 1240      Component Value Date/Time  CALCIUM 9.7 10/24/2011 1240   ALKPHOS 40 10/24/2011 1240   AST 17 10/24/2011 1240   ALT 11 10/24/2011 1240   BILITOT 0.5 10/24/2011 1240       Radiological Studies: Annual mammogram due this month; last study showed no new disease in the left breast   Impression and Plan: #1. 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 She remains free of any new disease now out 22 years  from initial cancer and 3-1/2 years from second primary. Plan: Continue Femara for another year and a half to complete 5 years.  #2. Chronic lymphedema right upper extremity second of breast cancer surgery.  #3. History of recurrent cellulitis right upper arms secondary to #2.   #4. Essential hypertension controlled on current medications.  #5. 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 Chronic renal insufficiency with creatinines running 1.4-1.6 over the last 2 years. We discussed this today. Some of the changes in her BUN and creatinine may be due to her thiazide diuretic. I will leave any adjustments to her primary care physician. I will see her again in one year.   Copy: Dr Shary Decamp; Claud Kelp; Todd Meisenger   Levert Feinstein, MD 1/7/20137:03 PM

## 2011-11-25 ENCOUNTER — Encounter: Payer: Self-pay | Admitting: Oncology

## 2011-11-25 ENCOUNTER — Other Ambulatory Visit: Payer: Self-pay | Admitting: Oncology

## 2011-11-25 DIAGNOSIS — M818 Other osteoporosis without current pathological fracture: Secondary | ICD-10-CM | POA: Insufficient documentation

## 2011-11-25 DIAGNOSIS — C50911 Malignant neoplasm of unspecified site of right female breast: Secondary | ICD-10-CM

## 2011-11-25 DIAGNOSIS — C50919 Malignant neoplasm of unspecified site of unspecified female breast: Secondary | ICD-10-CM

## 2011-11-25 HISTORY — DX: Adverse effect of unspecified drugs, medicaments and biological substances, initial encounter: M81.8

## 2011-12-01 ENCOUNTER — Ambulatory Visit (HOSPITAL_BASED_OUTPATIENT_CLINIC_OR_DEPARTMENT_OTHER): Payer: Medicare Other

## 2011-12-01 DIAGNOSIS — C50919 Malignant neoplasm of unspecified site of unspecified female breast: Secondary | ICD-10-CM

## 2011-12-01 DIAGNOSIS — C50911 Malignant neoplasm of unspecified site of right female breast: Secondary | ICD-10-CM

## 2011-12-01 DIAGNOSIS — T50905A Adverse effect of unspecified drugs, medicaments and biological substances, initial encounter: Secondary | ICD-10-CM

## 2011-12-01 DIAGNOSIS — M818 Other osteoporosis without current pathological fracture: Secondary | ICD-10-CM

## 2011-12-01 MED ORDER — ZOLEDRONIC ACID 4 MG/100ML IV SOLN
4.0000 mg | Freq: Once | INTRAVENOUS | Status: AC
  Administered 2011-12-01: 4 mg via INTRAVENOUS
  Filled 2011-12-01: qty 100

## 2011-12-01 MED ORDER — SODIUM CHLORIDE 0.9 % IV SOLN
Freq: Once | INTRAVENOUS | Status: AC
  Administered 2011-12-01: 09:00:00 via INTRAVENOUS

## 2012-01-20 ENCOUNTER — Telehealth: Payer: Self-pay | Admitting: Oncology

## 2012-01-20 NOTE — Telephone Encounter (Signed)
pt called to r/s her 2014 appts,pt sch her mammo for 01/13/13 and needed appt for afterwards which is now 01/18/13  aom

## 2012-01-22 ENCOUNTER — Encounter: Payer: Self-pay | Admitting: Oncology

## 2012-02-13 ENCOUNTER — Other Ambulatory Visit: Payer: Self-pay | Admitting: Specialist

## 2012-02-13 DIAGNOSIS — M545 Low back pain, unspecified: Secondary | ICD-10-CM

## 2012-02-14 ENCOUNTER — Ambulatory Visit
Admission: RE | Admit: 2012-02-14 | Discharge: 2012-02-14 | Disposition: A | Discharge status: Home / Self Care | Payer: Medicare Other | Source: Ambulatory Visit | Attending: Specialist | Admitting: Specialist

## 2012-02-14 DIAGNOSIS — M545 Low back pain, unspecified: Secondary | ICD-10-CM

## 2012-02-21 IMAGING — US US EXTREM LOW VENOUS*R*
1 series · 14 of 24 positions shown · non-contrast
Comparison: None.

CLINICAL DATA: Right leg pain and swelling, evaluate for DVT.

RIGHT LOWER EXTREMITY VENOUS DUPLEX ULTRASOUND
TECHNIQUE: Gray-scale sonography with graded compression, as well
as color Doppler and duplex ultrasound were performed to evaluate
the deep venous system of the lower extremity from the level of the
common femoral vein through the popliteal and proximal calf veins.
Spectral Doppler was utilized to evaluate flow at rest and with
distal augmentation maneuvers.

[Series 1: us extrem low venous*right* · 14 of 31 slices shown]
[im 1/31]
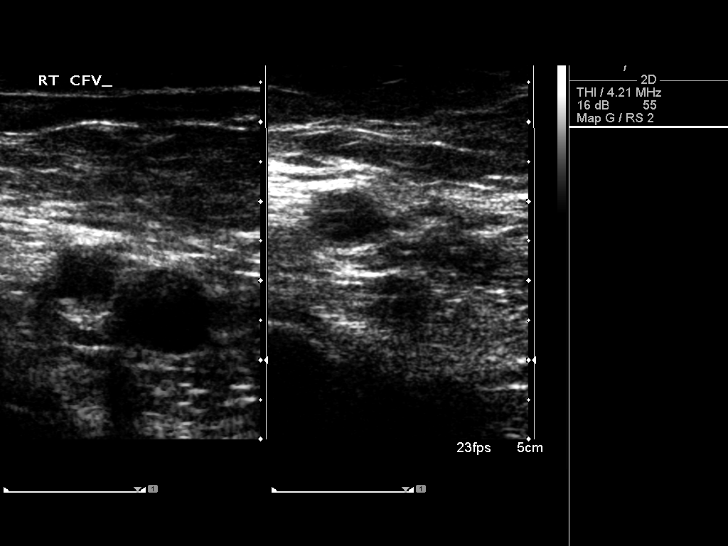
[im 3/31]
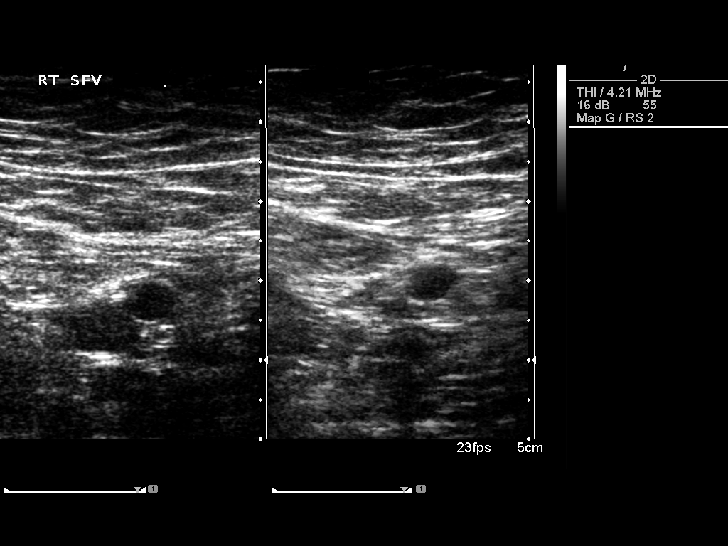
[im 6/31]
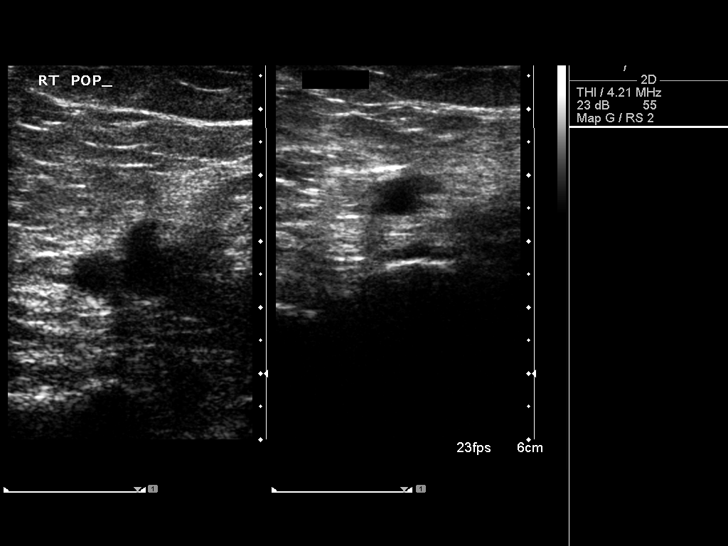
[im 8/31]
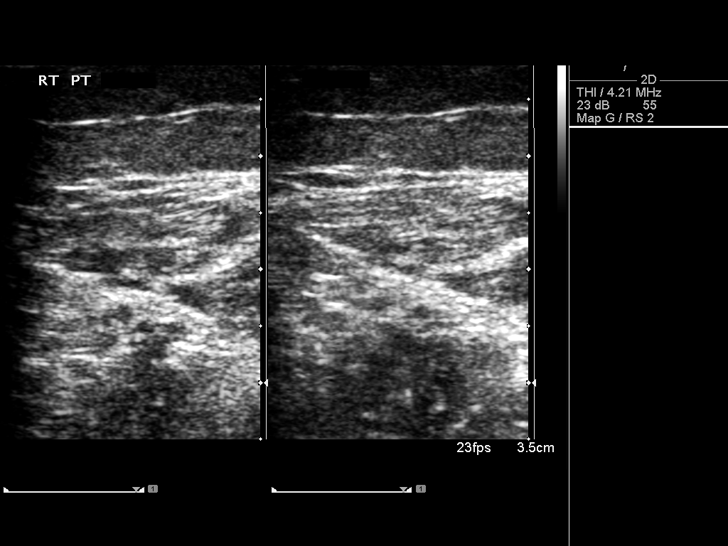
[im 10/31]
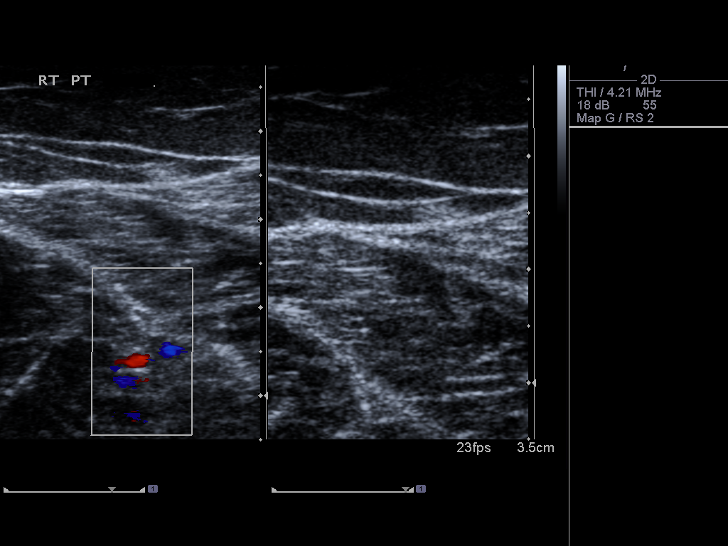
[im 12/31]
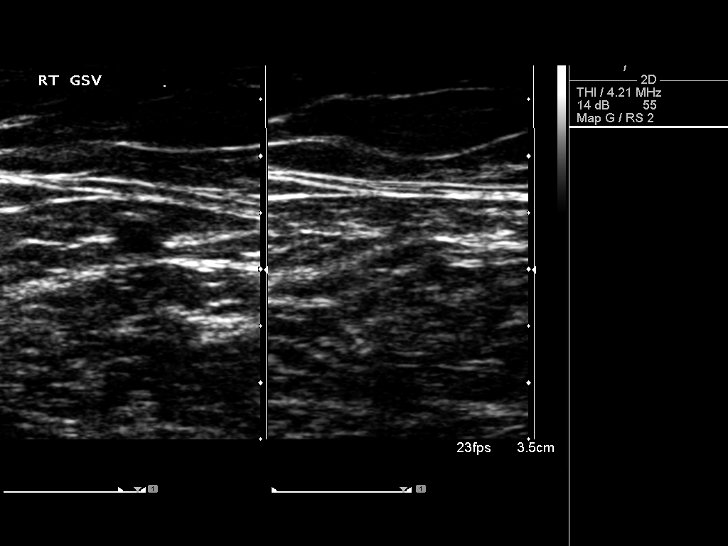
[im 15/31]
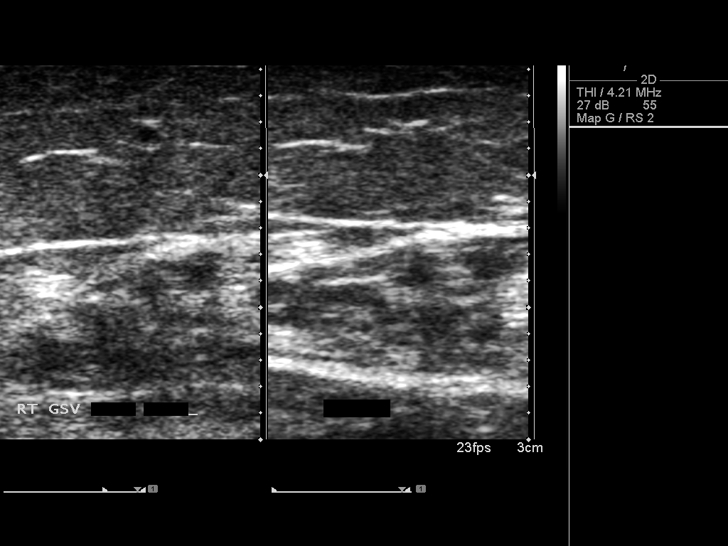
[im 16/31]
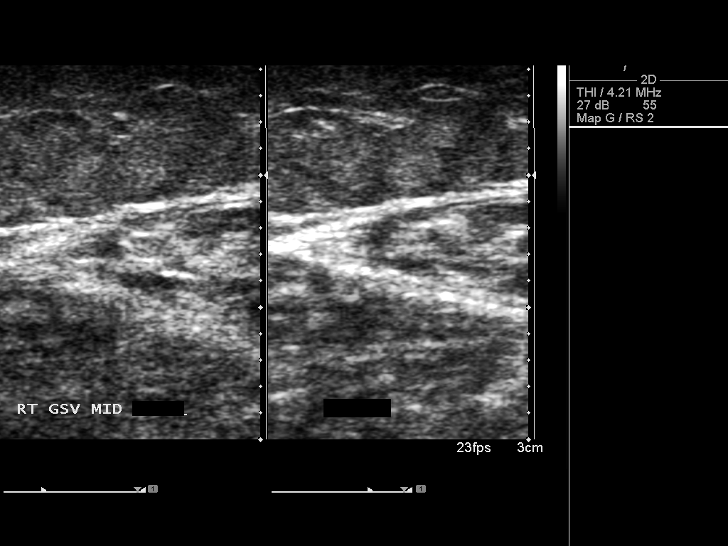
[im 19/31]
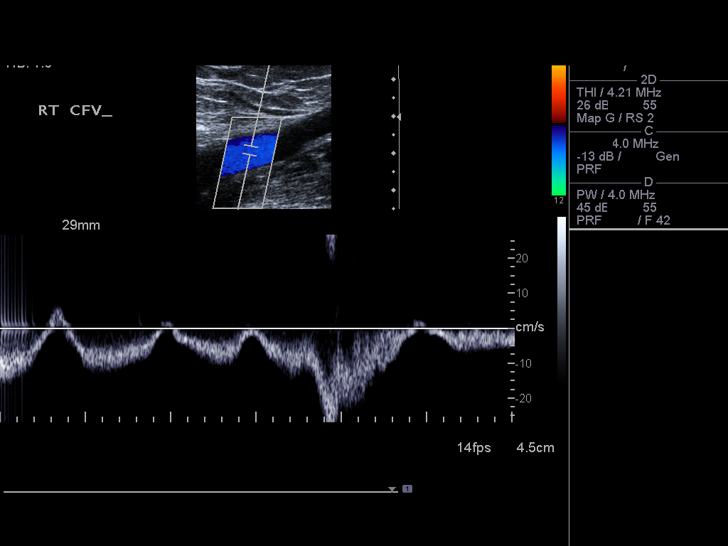
[im 21/31]
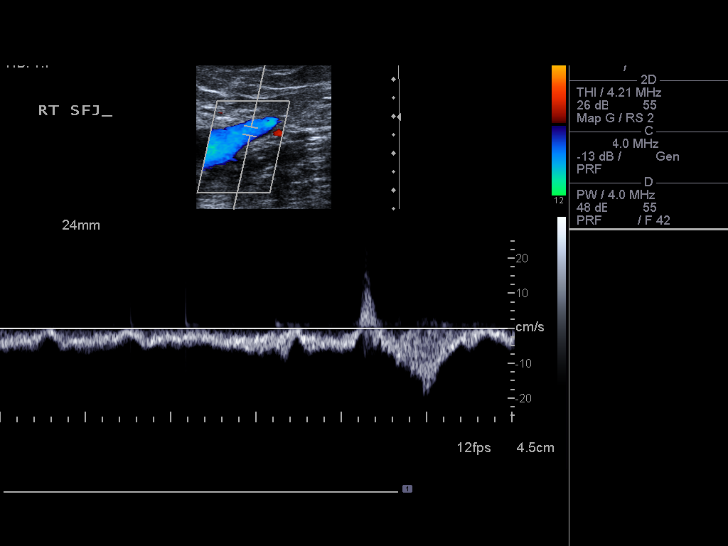
[im 24/31]
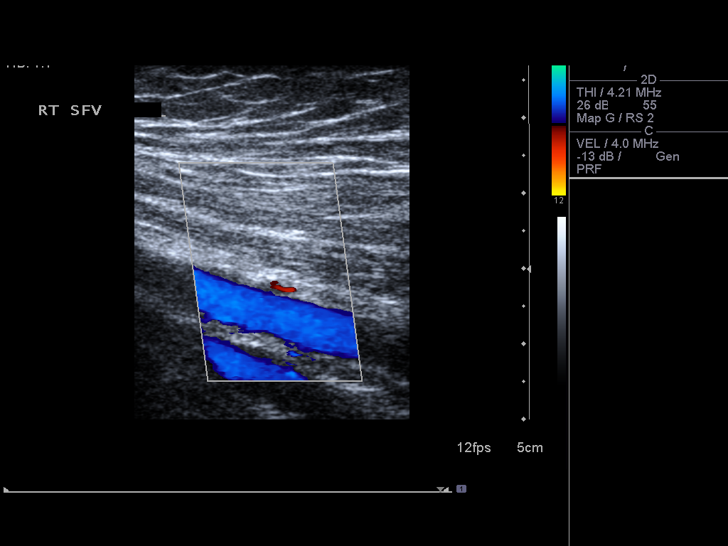
[im 25/31]
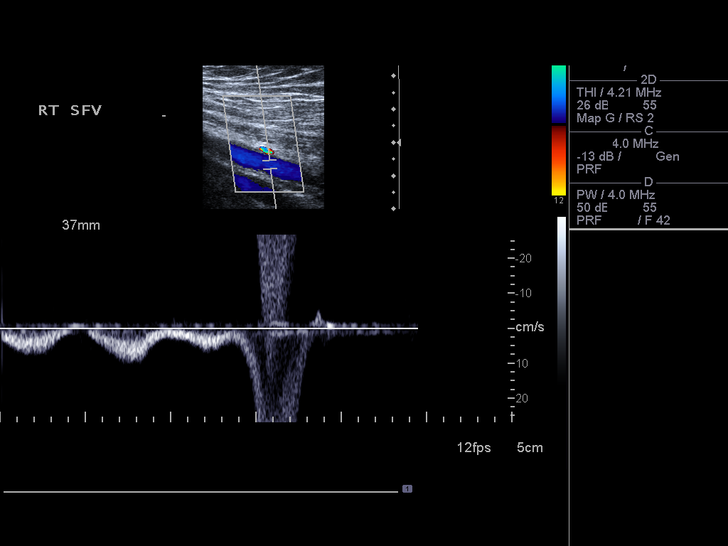
[im 28/31]
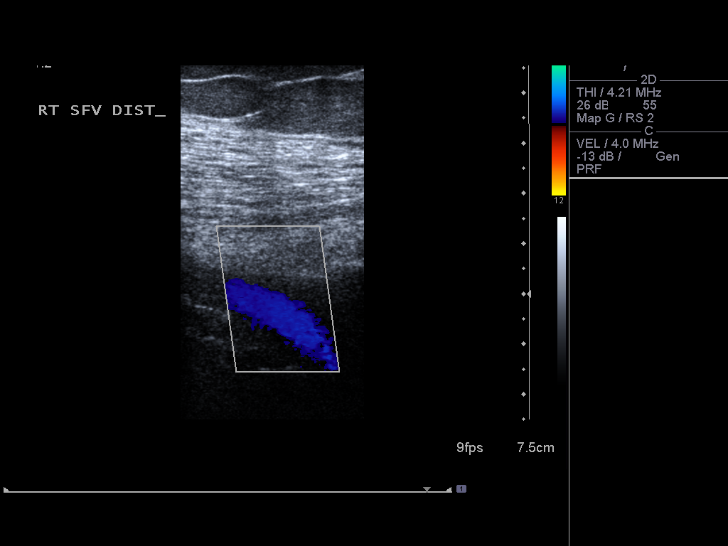
[im 31/31]
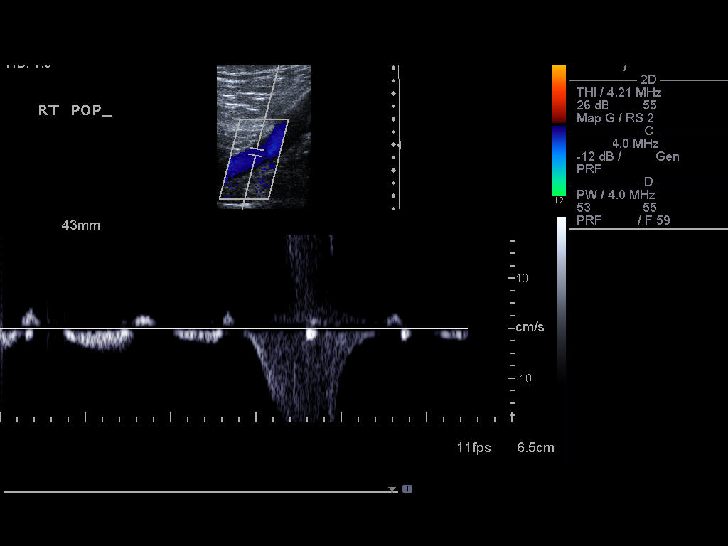

[14 of 24 positions shown; findings below may reference images not displayed]

FINDINGS: Normal compressibility of the common femoral,
superficial femoral, and popliteal veins is demonstrated, as well
as the visualized proximal calf veins.  No filling defects to
suggest DVT on grayscale or color Doppler imaging.  Doppler
waveforms show normal direction of venous flow, normal respiratory
phasicity and response to augmentation.
IMPRESSION: No evidence of right lower extremity deep vein thrombosis.

## 2012-03-02 ENCOUNTER — Ambulatory Visit: Payer: Medicare Other

## 2012-05-27 ENCOUNTER — Ambulatory Visit (HOSPITAL_BASED_OUTPATIENT_CLINIC_OR_DEPARTMENT_OTHER): Payer: Medicare Other

## 2012-05-27 ENCOUNTER — Ambulatory Visit (HOSPITAL_BASED_OUTPATIENT_CLINIC_OR_DEPARTMENT_OTHER): Payer: Medicare Other | Admitting: Lab

## 2012-05-27 VITALS — BP 138/70 | HR 46 | Temp 98.1°F

## 2012-05-27 DIAGNOSIS — C50919 Malignant neoplasm of unspecified site of unspecified female breast: Secondary | ICD-10-CM

## 2012-05-27 DIAGNOSIS — M818 Other osteoporosis without current pathological fracture: Secondary | ICD-10-CM

## 2012-05-27 DIAGNOSIS — C50911 Malignant neoplasm of unspecified site of right female breast: Secondary | ICD-10-CM

## 2012-05-27 DIAGNOSIS — T50905A Adverse effect of unspecified drugs, medicaments and biological substances, initial encounter: Secondary | ICD-10-CM

## 2012-05-27 LAB — COMPREHENSIVE METABOLIC PANEL
AST: 15 U/L (ref 0–37)
Alkaline Phosphatase: 38 U/L — ABNORMAL LOW (ref 39–117)
BUN: 24 mg/dL — ABNORMAL HIGH (ref 6–23)
Creatinine, Ser: 1.34 mg/dL — ABNORMAL HIGH (ref 0.50–1.10)
Glucose, Bld: 93 mg/dL (ref 70–99)
Potassium: 4.3 mEq/L (ref 3.5–5.3)
Total Bilirubin: 0.6 mg/dL (ref 0.3–1.2)

## 2012-05-27 MED ORDER — ZOLEDRONIC ACID 4 MG/100ML IV SOLN
4.0000 mg | Freq: Once | INTRAVENOUS | Status: AC
  Administered 2012-05-27: 4 mg via INTRAVENOUS
  Filled 2012-05-27: qty 100

## 2012-05-27 MED ORDER — SODIUM CHLORIDE 0.9 % IV SOLN: Freq: Once | INTRAVENOUS | Status: DC

## 2012-06-30 ENCOUNTER — Encounter (INDEPENDENT_AMBULATORY_CARE_PROVIDER_SITE_OTHER): Payer: Self-pay | Admitting: General Surgery

## 2012-06-30 ENCOUNTER — Ambulatory Visit (INDEPENDENT_AMBULATORY_CARE_PROVIDER_SITE_OTHER): Payer: Medicare Other | Admitting: General Surgery

## 2012-06-30 VITALS — BP 109/58 | HR 64 | Temp 97.3°F | Resp 16 | Ht 69.0 in | Wt 220.2 lb

## 2012-06-30 DIAGNOSIS — R220 Localized swelling, mass and lump, head: Secondary | ICD-10-CM

## 2012-06-30 DIAGNOSIS — R221 Localized swelling, mass and lump, neck: Secondary | ICD-10-CM

## 2012-06-30 DIAGNOSIS — C50919 Malignant neoplasm of unspecified site of unspecified female breast: Secondary | ICD-10-CM

## 2012-06-30 DIAGNOSIS — C50911 Malignant neoplasm of unspecified site of right female breast: Secondary | ICD-10-CM

## 2012-06-30 NOTE — Progress Notes (Addendum)
Patient ID: Jennifer Yang, female   DOB: 28-Jul-1941, 71 y.o.   MRN: [id number]  Chief Complaint  Patient presents with  . Breast Cancer Long Term Follow Up    yrly br check    HPI Jennifer Yang is a 71 y.o. female.  She returns for long-term followup of her right breast cancer. She is also interested in excision of the large soft tissue mass of her posterior neck.  This is a 71 year old Caucasian female who returns for a breast cancer followup. She is followed by Dr. Cephas Darby and Dr. Thea Silversmith.  Her initial cancer was stage I, 1.5 cm recepter positive of the right breast, status post lumpectomy July 16, 1990 followed by 6 cycles of CMF chemotherapy and 5 years of tamoxifen.  Second primary same breast April 2009, 1.8 cm, ER positive, PR week-positive, HER-2 negative, low-grade but positive lymphovascular invasion. She required right mastectomy for this. She elected not to take any additional chemotherapy. She was started on femara hormonal therapy and has noted no recurrence to date.   I last saw her June 30, 2011 at which time she was doing well without any sign of recurrence.   Most recent mammograms were January 13, 2012, left mammogram, which looks fine no focal abnormality.  She did not perceive any problems with her left breast or right mastectomy .  She does have a chronic right arm swelling.  In addition, she has had a slowly enlarging soft tissue mass of the right posterior neck for 35 years. 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 Because it is so large and projects so far up off the skin she now wants to have something done about this  HPI  Past Medical History  Diagnosis Date  . MVP (mitral valve prolapse)   . Allergy     shell fish  . Hyperlipidemia   . Hypertension   . Arthritis     knees  . Cancer     right Chemo/radiation '01/radical '09  . Thyroid disease   . Family history of breast cancer     mother  . Breast cancer, stage 1 07/16/1990  .  Breast cancer, right breast 02/16/2008  . Lymphedema of arm 11/24/2011  . Benign essential HTN 11/24/2011  . 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 Thyroidectomy, partial   . Breast lumpectomy     2001 right  . Mastectomy, radical     2009 right  . Knee arthroscopy     left  . Foot neuroma surgery     left  . Colonoscopy   . Upper gastrointestinal endoscopy   . Foot amputation     right hammer toe    Family History  Problem Relation Age of Onset  . Cancer Mother   . Heart disease Mother   . Heart disease Father   . Heart disease Brother     Social History History  Substance Use Topics  . Smoking status: Never Smoker   . Smokeless tobacco: Never Used  . Alcohol Use: No    Allergies  Allergen Reactions  . Codeine Sulfate Swelling  . Fish-Derived Products Other (See Comments)    Cellulitis   . Rocephin (Ceftriaxone Sodium) Other (See Comments)    Low blood count  . Sulfa Antibiotics Other (See Comments)    Mouth and lip sores    Current Outpatient Prescriptions  Medication Sig Dispense Refill  . aspirin 81 MG EC  tablet Take 81 mg by mouth daily.        . carvedilol (COREG) 12.5 MG tablet Take 12.5 mg by mouth 2 (two) times daily with a meal.        . Cholecalciferol (VITAMIN D) 1000 UNITS capsule Take 1,000 Units by mouth daily.        Marland Kitchen letrozole (FEMARA) 2.5 MG tablet Take 1 tablet (2.5 mg total) by mouth daily.  90 tablet  3  . lisinopril-hydrochlorothiazide (PRINZIDE,ZESTORETIC) 20-25 MG per tablet Take 1 tablet by mouth daily.        . simvastatin (ZOCOR) 5 MG tablet Take 5 mg by mouth at bedtime.        . Calcium-Vitamin D (CALTRATE 600 PLUS-VIT D PO) Take 1 tablet by mouth daily.          Review of Systems Review of Systems  Constitutional: Negative for fever, chills and unexpected weight change.  HENT: Negative for hearing loss, congestion, sore throat, trouble swallowing and voice change.   Eyes: Negative for  visual disturbance.  Respiratory: Negative for cough and wheezing.   Cardiovascular: Negative for chest pain, palpitations and leg swelling.  Gastrointestinal: Negative for nausea, vomiting, abdominal pain, diarrhea, constipation, blood in stool, abdominal distention and anal bleeding.  Genitourinary: Negative for hematuria, vaginal bleeding and difficulty urinating.  Musculoskeletal: Negative for arthralgias.  Skin: Negative for rash and wound.  Neurological: Negative for seizures, syncope and headaches.  Hematological: Negative for adenopathy. Does not bruise/bleed easily.  Psychiatric/Behavioral: Negative for confusion.    Blood pressure 109/58, pulse 64, temperature 97.3 F (36.3 C), temperature source Temporal, resp. rate 16, height 5\' 9"  (1.753 m), weight 220 lb 3.2 oz (99.882 kg).  Physical Exam Physical Exam  Constitutional: She is oriented to person, place, and time. She appears well-developed and well-nourished. No distress.       Weight 220 pounds. BMI 32.5.  HENT:  Head: Normocephalic and atraumatic.  Nose: Nose normal.  Mouth/Throat: No oropharyngeal exudate.  Eyes: Conjunctivae and EOM are normal. Pupils are equal, round, and reactive to light. Left eye exhibits no discharge. No scleral icterus.  Neck: Neck supple. No JVD present. No tracheal deviation present. No thyromegaly present.       Well healed thyroidectomy scar. In the right posterior neck, low down there is a 15 cm soft tissue mass. Consistent with lipoma. Overlying skin normal.  Cardiovascular: Normal rate, regular rhythm, normal heart sounds and intact distal pulses.   No murmur heard. Pulmonary/Chest: Effort normal and breath sounds normal. No respiratory distress. She has no wheezes. She has no rales. She exhibits no tenderness.       Right mastectomy scar well healed. No nodules, ulceration, or adenopathy. Left breast soft, no mass, no skin changes, no adenopathy. Well-healed Port-A-Cath scar parasternal  area.  Musculoskeletal: She exhibits edema. She exhibits no tenderness.       Chronic right upper extremity swelling. Nontender. Excellent range of motion.  Lymphadenopathy:    She has no cervical adenopathy.  Neurological: She is alert and oriented to person, place, and time. She exhibits normal muscle tone. Coordination normal.  Skin: Skin is warm. No rash noted. She is not diaphoretic. No erythema. No pallor.  Psychiatric: She has a normal mood and affect. Her behavior is normal. Judgment and thought content normal.    Data Reviewed Old chart. Mammograms. Notes from cancer Center.  Assessment    Invasive cancer right breast. Status post partial mastectomy and axillary lymph node dissection 1991  for receptor positive, T1 C. Cancer treated with CMF in 5 years of tamoxifen.  Invasive cancer right breast, recurrence 2009, treated with salvage right mastectomy, 1.8 cm tumor, receptor positive, HER-2-negative. Remains on adjuvant Femara. No recurrence 4 years postop  Normal mammogram left breast, 01/13/2012  15 cm lipoma right posterior neck, desires removal  Hypertension  Mitral valve prolapse    Plan    Will schedule for excision of large soft tissue mass of right posterior neck under general anesthesia in the near future. We will most likely need to leave a drain for a few days. Hopefully this can be done as an outpatient.  I discussed the indications, details, techniques, and numerous risks of the surgery with the patient and her husband. Her questions are answered. She understands these issues. She agrees with this plan.  Plan left mammogram February 2013.  See me one year.  Continued followup with Dr. Cyndie Chime who will decide when to take her off Femara.       Angelia Mould. Derrell Lolling, M.D., [facility] General and Minimally invasive Surgery Breast and Colorectal Surgery Office:   [phone] Pager:   [phone] si8/14/2013, 2:51 PM

## 2012-06-30 NOTE — Patient Instructions (Signed)
Examination of your right mastectomy wound and left breast showed no evidence of cancer or abnormal lymph nodes.  Continue your annual followup with Dr. Ardelle Balls.  Be sure to get your mammogram of the left breast in February of 2013.  You will be scheduled for excision of the large soft tissue mass of the posterior neck in the near future.

## 2012-07-13 ENCOUNTER — Encounter (HOSPITAL_BASED_OUTPATIENT_CLINIC_OR_DEPARTMENT_OTHER): Payer: Self-pay | Admitting: *Deleted

## 2012-07-13 NOTE — Progress Notes (Signed)
To come in for bmet Had ekg pcp-will get records-also hx murmur-pcp keeps up with her echos lymphedema rt arm.

## 2012-07-16 ENCOUNTER — Encounter (HOSPITAL_BASED_OUTPATIENT_CLINIC_OR_DEPARTMENT_OTHER)
Admission: RE | Admit: 2012-07-16 | Discharge: 2012-07-16 | Disposition: A | Discharge status: Home / Self Care | Payer: Medicare Other | Source: Ambulatory Visit | Attending: General Surgery | Admitting: General Surgery

## 2012-07-16 LAB — BASIC METABOLIC PANEL
Calcium: 10 mg/dL (ref 8.4–10.5)
GFR calc non Af Amer: 39 mL/min — ABNORMAL LOW (ref >90)
Glucose, Bld: 144 mg/dL — ABNORMAL HIGH (ref 70–99)
Potassium: 4.5 mEq/L (ref 3.5–5.1)
Sodium: 142 mEq/L (ref 135–145)

## 2012-07-20 NOTE — H&P (Signed)
Jennifer Yang     MRN: [id number]   Description: 71 year old female  Provider: Ernestene Mention, MD  Department: [facility]       Diagnoses     Breast cancer, right breast   - Primary    174.9    Breast cancer, stage 1     174.9    Neck mass     784.2         Vitals     BP Pulse Temp Resp Ht Wt    109/58 64 97.3 F (36.3 C) (Temporal) 16 5\' 9"  (1.753 m) 220 lb 3.2 oz (99.882 kg)    BMI - 32.52 kg/m2                History and Physical   Ernestene Mention, MD Patient ID: Jennifer Yang, female   DOB: [birth year], 71 y.o.   MRN: [id number]                 HPI [name] is a 71 y.o. female.  She returns for long-term followup of her right breast cancer. She is also interested in excision of the large soft tissue mass of her posterior neck.   This is a 71 year old Caucasian female who returns for a breast cancer followup. She is followed by Dr. Cephas Darby and Dr. Thea Silversmith.   Her initial cancer was stage I, 1.5 cm recepter positive of the right breast, status post lumpectomy July 16, 1990 followed by 6 cycles of CMF chemotherapy and 5 years of tamoxifen.   Second primary same breast April 2009, 1.8 cm, ER positive, PR week-positive, HER-2 negative, low-grade but positive lymphovascular invasion. She required right mastectomy for this. She elected not to take any additional chemotherapy. She was started on femara hormonal therapy and has noted no recurrence to date.    I last saw her June 30, 2011 at which time she was doing well without any sign of recurrence.    Most recent mammograms were January 13, 2012, left mammogram, which looks fine no focal abnormality.   She did not perceive any problems with her left breast or right mastectomy .  She does have a chronic right arm swelling.   In addition, she has had a slowly enlarging soft tissue mass of the right posterior neck for 35 years. 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 Because it is  so large and projects so far up off the skin she now wants to have something done about this       Past Medical History   Diagnosis  Date   .  MVP (mitral valve prolapse)     .  Allergy         shell fish   .  Hyperlipidemia     .  Hypertension     .  Arthritis         knees   .  Cancer         right Chemo/radiation '01/radical '09   .  Thyroid disease     .  Family history of breast cancer         mother   .  Breast cancer, stage 1  07/16/1990   .  Breast cancer, right breast  02/16/2008   .  Lymphedema of arm  11/24/2011   .  Benign essential HTN  11/24/2011   .  Drug-induced osteoporosis  11/25/2011       Past Surgical  History   Procedure  Date   .  Thyroidectomy, partial     .  Breast lumpectomy         2001 right   .  Mastectomy, radical         2009 right   .  Knee arthroscopy         left   .  Foot neuroma surgery         left   .  Colonoscopy     .  Upper gastrointestinal endoscopy     .  Foot amputation         right hammer toe       Family History   Problem  Relation  Age of Onset   .  Cancer  Mother     .  Heart disease  Mother     .  Heart disease  Father     .  Heart disease  Brother        Social History History   Substance Use Topics   .  Smoking status:  Never Smoker    .  Smokeless tobacco:  Never Used   .  Alcohol Use:  No       Allergies   Allergen  Reactions   .  Codeine Sulfate  Swelling   .  Fish-Derived Products  Other (See Comments)       Cellulitis    .  Rocephin (Ceftriaxone Sodium)  Other (See Comments)       Low blood count   .  Sulfa Antibiotics  Other (See Comments)       Mouth and lip sores       Current Outpatient Prescriptions   Medication  Sig  Dispense  Refill   .  aspirin 81 MG EC tablet  Take 81 mg by mouth daily.           .  carvedilol (COREG) 12.5 MG tablet  Take 12.5 mg by mouth 2 (two) times daily with a meal.           .  Cholecalciferol (VITAMIN D) 1000 UNITS capsule  Take 1,000 Units by mouth daily.            Marland Kitchen  letrozole (FEMARA) 2.5 MG tablet  Take 1 tablet (2.5 mg total) by mouth daily.   90 tablet   3   .  lisinopril-hydrochlorothiazide (PRINZIDE,ZESTORETIC) 20-25 MG per tablet  Take 1 tablet by mouth daily.           .  simvastatin (ZOCOR) 5 MG tablet  Take 5 mg by mouth at bedtime.           .  Calcium-Vitamin D (CALTRATE 600 PLUS-VIT D PO)  Take 1 tablet by mouth daily.              ROS  Constitutional: Negative for fever, chills and unexpected weight change.  HENT: Negative for hearing loss, congestion, sore throat, trouble swallowing and voice change.   Eyes: Negative for visual disturbance.  Respiratory: Negative for cough and wheezing.   Cardiovascular: Negative for chest pain, palpitations and leg swelling.  Gastrointestinal: Negative for nausea, vomiting, abdominal pain, diarrhea, constipation, blood in stool, abdominal distention and anal bleeding.  Genitourinary: Negative for hematuria, vaginal bleeding and difficulty urinating.  Musculoskeletal: Negative for arthralgias.  Skin: Negative for rash and wound.  Neurological: Negative for seizures, syncope and headaches.  Hematological: Negative for adenopathy. Does not bruise/bleed easily.  Psychiatric/Behavioral: Negative for  confusion.    Blood pressure 109/58, pulse 64, temperature 97.3 F (36.3 C), temperature source Temporal, resp. rate 16, height 5\' 9"  (1.753 m), weight 220 lb 3.2 oz (99.882 kg).   Physical Exam  Constitutional: She is oriented to person, place, and time. She appears well-developed and well-nourished. No distress.       Weight 220 pounds. BMI 32.5.  HENT:   Head: Normocephalic and atraumatic.   Nose: Nose normal.   Mouth/Throat: No oropharyngeal exudate.  Eyes: Conjunctivae and EOM are normal. Pupils are equal, round, and reactive to light. Left eye exhibits no discharge. No scleral icterus.  Neck: Neck supple. No JVD present. No tracheal deviation present. No thyromegaly present.       Well healed  thyroidectomy scar. In the right posterior neck, low down there is a 15 cm soft tissue mass. Consistent with lipoma. Overlying skin normal.  Cardiovascular: Normal rate, regular rhythm, normal heart sounds and intact distal pulses.    No murmur heard. Pulmonary/Chest: Effort normal and breath sounds normal. No respiratory distress. She has no wheezes. She has no rales. She exhibits no tenderness.       Right mastectomy scar well healed. No nodules, ulceration, or adenopathy. Left breast soft, no mass, no skin changes, no adenopathy. Well-healed Port-A-Cath scar parasternal area.  Musculoskeletal: She exhibits edema. She exhibits no tenderness.       Chronic right upper extremity swelling. Nontender. Excellent range of motion.  Lymphadenopathy:    She has no cervical adenopathy.  Neurological: She is alert and oriented to person, place, and time. She exhibits normal muscle tone. Coordination normal.  Skin: Skin is warm. No rash noted. She is not diaphoretic. No erythema. No pallor.  Psychiatric: She has a normal mood and affect. Her behavior is normal. Judgment and thought content normal.    Data Reviewed Old chart. Mammograms. Notes from cancer Center.   Assessment Invasive cancer right breast. Status post partial mastectomy and axillary lymph node dissection 1991 for receptor positive, T1 C. Cancer treated with CMF in 5 years of tamoxifen.   Invasive cancer right breast, recurrence 2009, treated with salvage right mastectomy, 1.8 cm tumor, receptor positive, HER-2-negative. Remains on adjuvant tomorrow. 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 We will most likely need to leave a drain for a few days. Hopefully this can be done as  an outpatient.   I discussed the indications, details, techniques, and numerous risks of the surgery with the patient and her husband. Her questions are answered. She understands these issues. She agrees with this plan.   Plan left mammogram February 2013.   See me one year.   Continued followup with Dr. Cyndie Chime who will decide when to take her off Femara.       Angelia Mould. Derrell Lolling, M.D., [facility] General and Minimally invasive Surgery Breast and Colorectal Surgery Office:   [phone] Pager:   [phone] si8/14/2013, 2:51 PM

## 2012-07-21 ENCOUNTER — Ambulatory Visit (HOSPITAL_BASED_OUTPATIENT_CLINIC_OR_DEPARTMENT_OTHER)
Admission: RE | Admit: 2012-07-21 | Discharge: 2012-07-21 | Disposition: A | Discharge status: Home / Self Care | Payer: Medicare Other | Source: Ambulatory Visit | Attending: General Surgery | Admitting: General Surgery

## 2012-07-21 ENCOUNTER — Ambulatory Visit (HOSPITAL_BASED_OUTPATIENT_CLINIC_OR_DEPARTMENT_OTHER): Payer: Medicare Other | Admitting: Anesthesiology

## 2012-07-21 ENCOUNTER — Encounter (HOSPITAL_BASED_OUTPATIENT_CLINIC_OR_DEPARTMENT_OTHER): Payer: Self-pay | Admitting: Anesthesiology

## 2012-07-21 ENCOUNTER — Encounter (HOSPITAL_BASED_OUTPATIENT_CLINIC_OR_DEPARTMENT_OTHER): Payer: Self-pay | Admitting: *Deleted

## 2012-07-21 ENCOUNTER — Encounter (HOSPITAL_BASED_OUTPATIENT_CLINIC_OR_DEPARTMENT_OTHER)
Admission: RE | Disposition: A | Discharge status: Home / Self Care | Payer: Self-pay | Source: Ambulatory Visit | Attending: General Surgery

## 2012-07-21 ENCOUNTER — Telehealth (INDEPENDENT_AMBULATORY_CARE_PROVIDER_SITE_OTHER): Payer: Self-pay | Admitting: General Surgery

## 2012-07-21 DIAGNOSIS — D171 Benign lipomatous neoplasm of skin and subcutaneous tissue of trunk: Secondary | ICD-10-CM

## 2012-07-21 DIAGNOSIS — D1739 Benign lipomatous neoplasm of skin and subcutaneous tissue of other sites: Secondary | ICD-10-CM

## 2012-07-21 DIAGNOSIS — Z853 Personal history of malignant neoplasm of breast: Secondary | ICD-10-CM | POA: Insufficient documentation

## 2012-07-21 DIAGNOSIS — I10 Essential (primary) hypertension: Secondary | ICD-10-CM | POA: Insufficient documentation

## 2012-07-21 DIAGNOSIS — F411 Generalized anxiety disorder: Secondary | ICD-10-CM | POA: Insufficient documentation

## 2012-07-21 DIAGNOSIS — Z9010 Acquired absence of unspecified breast and nipple: Secondary | ICD-10-CM | POA: Insufficient documentation

## 2012-07-21 HISTORY — DX: Other specified postprocedural states: R11.2

## 2012-07-21 HISTORY — DX: Anxiety disorder, unspecified: F41.9

## 2012-07-21 HISTORY — DX: Encounter for fitting and adjustment of spectacles and contact lenses: Z46.0

## 2012-07-21 HISTORY — DX: Other specified postprocedural states: Z98.890

## 2012-07-21 HISTORY — PX: MASS EXCISION: SHX2000

## 2012-07-21 HISTORY — DX: Dizziness and giddiness: R42

## 2012-07-21 LAB — POCT HEMOGLOBIN-HEMACUE: Hemoglobin: 12.1 g/dL (ref 12.0–15.0)

## 2012-07-21 SURGERY — EXCISION MASS
Anesthesia: General | Site: Neck | Wound class: Clean

## 2012-07-21 MED ORDER — SODIUM CHLORIDE 0.9 % IJ SOLN: 3.0000 mL | INTRAMUSCULAR | Status: DC | PRN

## 2012-07-21 MED ORDER — LACTATED RINGERS IV SOLN
INTRAVENOUS | Status: DC
  Administered 2012-07-21: 08:00:00 via INTRAVENOUS

## 2012-07-21 MED ORDER — MEPERIDINE HCL 25 MG/ML IJ SOLN: 6.2500 mg | INTRAMUSCULAR | Status: DC | PRN

## 2012-07-21 MED ORDER — DEXAMETHASONE SODIUM PHOSPHATE 4 MG/ML IJ SOLN
INTRAMUSCULAR | Status: DC | PRN
  Administered 2012-07-21: 10 mg via INTRAVENOUS

## 2012-07-21 MED ORDER — ONDANSETRON HCL 4 MG/2ML IJ SOLN: 4.0000 mg | Freq: Once | INTRAMUSCULAR | Status: DC

## 2012-07-21 MED ORDER — ONDANSETRON HCL 4 MG/2ML IJ SOLN
4.0000 mg | Freq: Once | INTRAMUSCULAR | Status: AC
  Administered 2012-07-21: 4 mg via INTRAVENOUS

## 2012-07-21 MED ORDER — SCOPOLAMINE 1 MG/3DAYS TD PT72
1.0000 | MEDICATED_PATCH | Freq: Once | TRANSDERMAL | Status: DC
  Administered 2012-07-21: 1.5 mg via TRANSDERMAL

## 2012-07-21 MED ORDER — OXYCODONE HCL 5 MG PO TABS: 5.0000 mg | ORAL_TABLET | ORAL | Status: DC | PRN

## 2012-07-21 MED ORDER — SODIUM CHLORIDE 0.9 % IV SOLN: INTRAVENOUS | Status: DC

## 2012-07-21 MED ORDER — KETOROLAC TROMETHAMINE 30 MG/ML IJ SOLN
INTRAMUSCULAR | Status: DC | PRN
  Administered 2012-07-21: 30 mg via INTRAVENOUS

## 2012-07-21 MED ORDER — SODIUM CHLORIDE 0.9 % IV SOLN: 250.0000 mL | INTRAVENOUS | Status: DC | PRN

## 2012-07-21 MED ORDER — FENTANYL CITRATE 0.05 MG/ML IJ SOLN
INTRAMUSCULAR | Status: DC | PRN
  Administered 2012-07-21: 50 ug via INTRAVENOUS
  Administered 2012-07-21: 25 ug via INTRAVENOUS

## 2012-07-21 MED ORDER — METHYLENE BLUE 1 % INJ SOLN
INTRAMUSCULAR | Status: AC
  Filled 2012-07-21: qty 10

## 2012-07-21 MED ORDER — MIDAZOLAM HCL 2 MG/2ML IJ SOLN
0.5000 mg | INTRAMUSCULAR | Status: DC | PRN
  Administered 2012-07-21: 1 mg via INTRAVENOUS

## 2012-07-21 MED ORDER — SUCCINYLCHOLINE CHLORIDE 20 MG/ML IJ SOLN
INTRAMUSCULAR | Status: DC | PRN
  Administered 2012-07-21: 100 mg via INTRAVENOUS

## 2012-07-21 MED ORDER — EPHEDRINE SULFATE 50 MG/ML IJ SOLN
INTRAMUSCULAR | Status: DC | PRN
  Administered 2012-07-21 (×3): 10 mg via INTRAVENOUS

## 2012-07-21 MED ORDER — HYDROCODONE-ACETAMINOPHEN 5-325 MG PO TABS: 1.0000 | ORAL_TABLET | ORAL | Status: AC | PRN

## 2012-07-21 MED ORDER — GLYCOPYRROLATE 0.2 MG/ML IJ SOLN
INTRAMUSCULAR | Status: DC | PRN
  Administered 2012-07-21: 0.2 mg via INTRAVENOUS

## 2012-07-21 MED ORDER — ONDANSETRON HCL 4 MG/2ML IJ SOLN: 4.0000 mg | Freq: Four times a day (QID) | INTRAMUSCULAR | Status: DC | PRN

## 2012-07-21 MED ORDER — SODIUM CHLORIDE 0.9 % IJ SOLN: 3.0000 mL | Freq: Two times a day (BID) | INTRAMUSCULAR | Status: DC

## 2012-07-21 MED ORDER — MORPHINE SULFATE 2 MG/ML IJ SOLN: 2.0000 mg | INTRAMUSCULAR | Status: DC | PRN

## 2012-07-21 MED ORDER — PROPOFOL 10 MG/ML IV BOLUS
INTRAVENOUS | Status: DC | PRN
  Administered 2012-07-21: 160 mg via INTRAVENOUS

## 2012-07-21 MED ORDER — LIDOCAINE HCL (CARDIAC) 20 MG/ML IV SOLN
INTRAVENOUS | Status: DC | PRN
  Administered 2012-07-21: 80 mg via INTRAVENOUS

## 2012-07-21 MED ORDER — ACETAMINOPHEN 325 MG PO TABS: 650.0000 mg | ORAL_TABLET | ORAL | Status: DC | PRN

## 2012-07-21 MED ORDER — ACETAMINOPHEN 650 MG RE SUPP: 650.0000 mg | RECTAL | Status: DC | PRN

## 2012-07-21 MED ORDER — HYDROMORPHONE HCL PF 1 MG/ML IJ SOLN: 0.2500 mg | INTRAMUSCULAR | Status: DC | PRN

## 2012-07-21 MED ORDER — BUPIVACAINE-EPINEPHRINE 0.5% -1:200000 IJ SOLN
INTRAMUSCULAR | Status: DC | PRN
  Administered 2012-07-21: 18 mL

## 2012-07-21 MED ORDER — CEFAZOLIN SODIUM-DEXTROSE 2-3 GM-% IV SOLR
INTRAVENOUS | Status: DC | PRN
  Administered 2012-07-21: 2 g via INTRAVENOUS

## 2012-07-21 SURGICAL SUPPLY — 59 items
ADH SKN CLS APL DERMABOND .7 (GAUZE/BANDAGES/DRESSINGS) ×1
APL SKNCLS STERI-STRIP NONHPOA (GAUZE/BANDAGES/DRESSINGS)
BANDAGE ELASTIC 6 VELCRO ST LF (GAUZE/BANDAGES/DRESSINGS) IMPLANT
BENZOIN TINCTURE PRP APPL 2/3 (GAUZE/BANDAGES/DRESSINGS) IMPLANT
BLADE HEX COATED 2.75 (ELECTRODE) ×2 IMPLANT
BLADE SURG 15 STRL LF DISP TIS (BLADE) ×2 IMPLANT
BLADE SURG 15 STRL SS (BLADE) ×4
BLADE SURG ROTATE 9660 (MISCELLANEOUS) ×1 IMPLANT
CANISTER SUCTION 1200CC (MISCELLANEOUS) ×2 IMPLANT
CHLORAPREP W/TINT 26ML (MISCELLANEOUS) ×2 IMPLANT
CLOTH BEACON ORANGE TIMEOUT ST (SAFETY) ×2 IMPLANT
COVER MAYO STAND STRL (DRAPES) ×2 IMPLANT
COVER TABLE BACK 60X90 (DRAPES) ×2 IMPLANT
DECANTER SPIKE VIAL GLASS SM (MISCELLANEOUS) IMPLANT
DERMABOND ADVANCED (GAUZE/BANDAGES/DRESSINGS) ×1
DERMABOND ADVANCED .7 DNX12 (GAUZE/BANDAGES/DRESSINGS) IMPLANT
DEVICE DUBIN W/COMP PLATE 8390 (MISCELLANEOUS) IMPLANT
DRAIN CHANNEL 19F RND (DRAIN) ×1 IMPLANT
DRAPE LAPAROTOMY TRNSV 102X78 (DRAPE) IMPLANT
DRAPE PED LAPAROTOMY (DRAPES) ×2 IMPLANT
DRAPE UTILITY XL STRL (DRAPES) ×2 IMPLANT
ELECT REM PT RETURN 9FT ADLT (ELECTROSURGICAL) ×2
ELECTRODE REM PT RTRN 9FT ADLT (ELECTROSURGICAL) ×1 IMPLANT
EVACUATOR SILICONE 100CC (DRAIN) ×1 IMPLANT
GAUZE SPONGE 4X4 12PLY STRL LF (GAUZE/BANDAGES/DRESSINGS) ×1 IMPLANT
GAUZE SPONGE 4X4 16PLY XRAY LF (GAUZE/BANDAGES/DRESSINGS) IMPLANT
GLOVE ECLIPSE 6.5 STRL STRAW (GLOVE) ×1 IMPLANT
GLOVE EUDERMIC 7 POWDERFREE (GLOVE) ×2 IMPLANT
GOWN PREVENTION PLUS XLARGE (GOWN DISPOSABLE) ×2 IMPLANT
GOWN PREVENTION PLUS XXLARGE (GOWN DISPOSABLE) ×2 IMPLANT
KIT MARKER MARGIN INK (KITS) IMPLANT
NDL HYPO 25X1 1.5 SAFETY (NEEDLE) ×1 IMPLANT
NEEDLE HYPO 22GX1.5 SAFETY (NEEDLE) IMPLANT
NEEDLE HYPO 25X1 1.5 SAFETY (NEEDLE) IMPLANT
NS IRRIG 1000ML POUR BTL (IV SOLUTION) ×2 IMPLANT
PACK BASIN DAY SURGERY FS (CUSTOM PROCEDURE TRAY) ×2 IMPLANT
PENCIL BUTTON HOLSTER BLD 10FT (ELECTRODE) ×2 IMPLANT
SLEEVE SCD COMPRESS KNEE MED (MISCELLANEOUS) IMPLANT
SPONGE LAP 4X18 X RAY DECT (DISPOSABLE) ×2 IMPLANT
STAPLER VISISTAT 35W (STAPLE) IMPLANT
STRIP CLOSURE SKIN 1/2X4 (GAUZE/BANDAGES/DRESSINGS) IMPLANT
SUT ETHILON 3 0 PS 1 (SUTURE) ×1 IMPLANT
SUT ETHILON 4 0 PS 2 18 (SUTURE) IMPLANT
SUT MON AB 4-0 PC3 18 (SUTURE) IMPLANT
SUT SILK 2 0 SH (SUTURE) ×2 IMPLANT
SUT VIC AB 2-0 SH 27 (SUTURE)
SUT VIC AB 2-0 SH 27XBRD (SUTURE) IMPLANT
SUT VIC AB 3-0 FS2 27 (SUTURE) ×1 IMPLANT
SUT VIC AB 4-0 P-3 18XBRD (SUTURE) IMPLANT
SUT VIC AB 4-0 P3 18 (SUTURE)
SUT VICRYL 3-0 CR8 SH (SUTURE) ×2 IMPLANT
SUT VICRYL 4-0 PS2 18IN ABS (SUTURE) IMPLANT
SYR BULB 3OZ (MISCELLANEOUS) IMPLANT
SYR CONTROL 10ML LL (SYRINGE) ×2 IMPLANT
TAPE HYPAFIX 4 X10 (GAUZE/BANDAGES/DRESSINGS) IMPLANT
TOWEL OR NON WOVEN STRL DISP B (DISPOSABLE) ×2 IMPLANT
TUBE CONNECTING 20X1/4 (TUBING) ×2 IMPLANT
WATER STERILE IRR 1000ML POUR (IV SOLUTION) ×2 IMPLANT
YANKAUER SUCT BULB TIP NO VENT (SUCTIONS) ×2 IMPLANT

## 2012-07-21 NOTE — Telephone Encounter (Signed)
Called back patient to advise of appointment set for drain check next week on 07/28/12 at 4:30 per Dr. Derrell Lolling. Reconfirmed regularly scheduled post op on 08/05/12 at 12:00. Advised the patient that if she develops any signs of infection or starts to run a fever to call us. Patient agreed.

## 2012-07-21 NOTE — Progress Notes (Signed)
Versed 1 mg given iv as ordered by Dr. Jacklynn Bue for anxiety and zofran as ordered for nausea with relief

## 2012-07-21 NOTE — Op Note (Signed)
Patient Name:           Jennifer Yang   Date of Surgery:        07/21/2012  Pre op Diagnosis:      12 cm soft tissue mass upper back and lower neck, suspect lipoma  Post op Diagnosis:    same  Procedure:                 Excision deep soft tissue mass upper back, 12 cm  Surgeon:                     Angelia Mould. Derrell Lolling, M.D., FACS  Assistant:                      none  Operative Indications:   This is a 71 year old Caucasian female who has a history of right breast cancer, and also has a slowly enlarging soft tissue mass of the right posterior neck.  Her initial breast cancer was stage I, right breast, status post lumpectomy July 16, 1990 followed by chemotherapy and 5 years of tamoxifen.  Second primary same breast April 2009, 1.8 cm, She required right mastectomy for this. She elected not to take any additional chemotherapy. She has noted no recurrence to date.   Most recent mammograms were January 13, 2012, left mammogram, which looks fine no focal abnormality.  She did not perceive any problems with her left breast or right mastectomy . She does have a chronic right arm swelling.  She has had a slowly enlarging soft tissue mass of the right posterior neck for 35 years. 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 Because it is so large and projects so far up off the skin she now wants to have something done about this.on exam there is a huge, at least 12 cm to 15 cm mass of the upper back and lower neck. This is slightly to the right of the midline and has physical characteristics of a lipoma.   Operative Findings:       I removed a 12-15 cm fatty tumor from the deep subcutaneous tissue of the upper back. This went all the way down to the deep muscle fascia but did not invade the muscle. It was sent for pathologic exam  Procedure in Detail:          Following the induction of general endotracheal anesthesia intravenous antibiotics were given, a surgical time out was performed, and the patient  was rolled in a prone position with appropriate padding. The neck and upper back were prepped and draped in a sterile fashion. 0.5% Marcaine with epinephrine was used as a local infiltration anesthetic. A transverse incision was made, at least 12 cm in length. Dissection was carried down through subcutaneous tissue and I encountered a  multilobulated, encapsulated fatty tumor. This did not shell out very easily but had a nice dissection plane which was taken down with electrocautery raising a skin flap superiorly and inferiorly. It was then dissected off of the deep muscle fascia with electrocautery and removed intact and sent to the lab. Hemostasis was excellent and achieved electrocautery. The wound was irrigated with saline. Because of the large space left to drain. A 19 [place] Blake drain was placed in the wound and brought out through a separate stab incision inferiorly, sutured to the skin and connected to suction bulb. The subcutaneous tissue was closed with interrupted sutures of 3-0 Vicryl and the skin closed with  a running subcuticular suture of 4-0 Monocryl and Dermabond. Bandages were placed. The patient was taken recovery room in stable condition. There were no complications. Counts correct. EBL 15 cc.     Angelia Mould. Derrell Lolling, M.D., FACS General and Minimally Invasive Surgery Breast and Colorectal Surgery  07/21/2012 9:23 AM

## 2012-07-21 NOTE — Anesthesia Preprocedure Evaluation (Signed)
Anesthesia Evaluation  Patient identified by MRN, date of birth, ID band Patient awake    Reviewed: Allergy & Precautions, H&P , NPO status , Patient's Chart, lab work & pertinent test results  History of Anesthesia Complications (+) PONV  Airway Mallampati: III  Neck ROM: Limited  Mouth opening: Limited Mouth Opening  Dental  (+) Teeth Intact   Pulmonary neg pulmonary ROS,  breath sounds clear to auscultation        Cardiovascular hypertension, Rhythm:Regular Rate:Normal     Neuro/Psych Anxiety New onset vertigo    GI/Hepatic negative GI ROS, Neg liver ROS,   Endo/Other  negative endocrine ROS  Renal/GU negative Renal ROS     Musculoskeletal   Abdominal (+) + obese,   Peds  Hematology  (+) Blood dyscrasia, anemia ,   Anesthesia Other Findings   Reproductive/Obstetrics                           Anesthesia Physical Anesthesia Plan  ASA: II  Anesthesia Plan: General   Post-op Pain Management:    Induction:   Airway Management Planned: Oral ETT  Additional Equipment:   Intra-op Plan:   Post-operative Plan: Extubation in OR  Informed Consent: I have reviewed the patients History and Physical, chart, labs and discussed the procedure including the risks, benefits and alternatives for the proposed anesthesia with the patient or authorized representative who has indicated his/her understanding and acceptance.   Dental advisory given  Plan Discussed with: CRNA and Surgeon  Anesthesia Plan Comments:         Anesthesia Quick Evaluation

## 2012-07-21 NOTE — Anesthesia Postprocedure Evaluation (Signed)
  Anesthesia Post-op Note  Patient: Jennifer Yang  Procedure(s) Performed: Procedure(s) (LRB) with comments: EXCISION MASS (N/A) - excision 15cm posterior neck mass posterior  Patient Location: PACU  Anesthesia Type: General  Level of Consciousness: awake  Airway and Oxygen Therapy: Patient Spontanous Breathing  Post-op Pain: mild  Post-op Assessment: Post-op Vital signs reviewed  Post-op Vital Signs: stable  Complications: No apparent anesthesia complications

## 2012-07-21 NOTE — Anesthesia Procedure Notes (Signed)
Procedure Name: Intubation Date/Time: 07/21/2012 8:39 AM Performed by: Burna Cash Pre-anesthesia Checklist: Patient identified, Emergency Drugs available, Suction available and Patient being monitored Patient Re-evaluated:Patient Re-evaluated prior to inductionOxygen Delivery Method: Circle System Utilized Preoxygenation: Pre-oxygenation with 100% oxygen Intubation Type: IV induction Ventilation: Mask ventilation with difficulty Grade View: Grade I Tube type: Oral Tube size: 7.0 mm Number of attempts: 1 Airway Equipment and Method: stylet,  oral airway and Video-laryngoscopy Placement Confirmation: ETT inserted through vocal cords under direct vision,  positive ETCO2 and breath sounds checked- equal and bilateral Secured at: 21 cm Tube secured with: Tape Dental Injury: Teeth and Oropharynx as per pre-operative assessment

## 2012-07-21 NOTE — Interval H&P Note (Signed)
History and Physical Interval Note:  07/21/2012 8:06 AM  Jennifer Yang  has presented today for surgery, with the diagnosis of neck mass  The goals and the various methods of treatment have been discussed with the patient and family. After consideration of risks, benefits and other options for treatment, the patient has consented to  Procedure(s) (LRB) with comments: EXCISION MASS (N/A) - excision 15cm posterior neck mass as a surgical intervention .  The patient's history has been reviewed and the patient examined today, no change in status, stable for surgery.  I have reviewed the patient's chart and labs.  Questions were answered to the patient's satisfaction.     Ernestene Mention

## 2012-07-21 NOTE — Transfer of Care (Signed)
Immediate Anesthesia Transfer of Care Note  Patient: Jennifer Yang  Procedure(s) Performed: Procedure(s) (LRB) with comments: EXCISION MASS (N/A) - excision 15cm posterior neck mass posterior  Patient Location: PACU  Anesthesia Type: General  Level of Consciousness: sedated  Airway & Oxygen Therapy: Patient Spontanous Breathing and Patient connected to face mask oxygen  Post-op Assessment: Report given to PACU RN and Post -op Vital signs reviewed and stable  Post vital signs: Reviewed and stable  Complications: No apparent anesthesia complications

## 2012-07-22 ENCOUNTER — Encounter (HOSPITAL_BASED_OUTPATIENT_CLINIC_OR_DEPARTMENT_OTHER): Payer: Self-pay | Admitting: General Surgery

## 2012-07-23 ENCOUNTER — Telehealth (INDEPENDENT_AMBULATORY_CARE_PROVIDER_SITE_OTHER): Payer: Self-pay | Admitting: General Surgery

## 2012-07-23 NOTE — Telephone Encounter (Signed)
Called patient to advise of pathology report (benign lipoma). Confirmed patient will be seen next week.

## 2012-07-23 NOTE — Progress Notes (Signed)
Quick Note:  Inform patient of Pathology report,. Benign lipoma. ______ 

## 2012-07-28 ENCOUNTER — Encounter (INDEPENDENT_AMBULATORY_CARE_PROVIDER_SITE_OTHER): Payer: Self-pay | Admitting: General Surgery

## 2012-07-28 ENCOUNTER — Ambulatory Visit (INDEPENDENT_AMBULATORY_CARE_PROVIDER_SITE_OTHER): Payer: Medicare Other | Admitting: General Surgery

## 2012-07-28 VITALS — BP 160/90 | HR 72 | Temp 97.6°F | Resp 16 | Ht 69.0 in | Wt 223.2 lb

## 2012-07-28 DIAGNOSIS — D170 Benign lipomatous neoplasm of skin and subcutaneous tissue of head, face and neck: Secondary | ICD-10-CM

## 2012-07-28 DIAGNOSIS — D1739 Benign lipomatous neoplasm of skin and subcutaneous tissue of other sites: Secondary | ICD-10-CM

## 2012-07-28 NOTE — Patient Instructions (Signed)
Your neck incision looks fine. It is healing without any obvious complication. The drain was removed today.  You may shower, starting tomorrow.  The sure to get your left mammogram in February 2013.  Keep your regular appointments with Dr. Cyndie Chime  Return to see Dr. Derrell Lolling in one year.  Call Dr. Ronne Binning and make an appointment soon to have your blood pressure checked, since it was elevated today.

## 2012-07-28 NOTE — Progress Notes (Signed)
Patient ID: Jennifer Yang, female   DOB: [birth year], 71 y.o.   MRN: [id number]  History: This patient underwent excision of a 12 cm soft tissue mass of the lower neck and upper back on 07/21/2012. Final pathology report shows benign lipoma. The patient was given a copy of the report. The drainage is down to 7 cc per day. She says she is tired and asked [place] to check her blood pressure  Exam: Patient is in no distress, just a little fatigued. Blood pressure 160/90. Heart rate 72. Temp 97.6. Respiratory rate 16. Posterior neck incision is healing uneventfully. Minimal ecchymoses. Good tissue approximation. The drain was removed.  Assessment: 12 cm lipoma posterior neck, recovering uneventfully in the early postop period following excision History right breast cancer with lumpectomy and lymph node dissection 1999. History recurrent right breast cancer with history right total mastectomy 2009 followed by Dr. Cyndie Yang,  on Femara. She requested I continue to see her annually Hypertension, currently under management by Dr. Thea Yang  Plan: I told her to call Dr. Thea Yang and have her blood pressure checked by him in the next day or 2. .  left breast mammogram February 2013. Keep appointment with Dr. Candyce Yang to decide when you may come off of Femara Return to see Dr. Derrell Yang in one year    Jennifer Yang M. Jennifer Yang, M.D., [facility] General and Minimally invasive Surgery Breast and Colorectal Surgery Office:   [phone] Pager:   [phone]

## 2012-08-05 ENCOUNTER — Encounter (INDEPENDENT_AMBULATORY_CARE_PROVIDER_SITE_OTHER): Payer: Medicare Other | Admitting: General Surgery

## 2012-08-28 ENCOUNTER — Emergency Department (HOSPITAL_BASED_OUTPATIENT_CLINIC_OR_DEPARTMENT_OTHER)
Admission: EM | Admit: 2012-08-28 | Discharge: 2012-08-28 | Disposition: A | Discharge status: Home / Self Care | Payer: Medicare Other | Attending: Emergency Medicine | Admitting: Emergency Medicine

## 2012-08-28 ENCOUNTER — Encounter (HOSPITAL_BASED_OUTPATIENT_CLINIC_OR_DEPARTMENT_OTHER): Payer: Self-pay | Admitting: *Deleted

## 2012-08-28 ENCOUNTER — Emergency Department (HOSPITAL_BASED_OUTPATIENT_CLINIC_OR_DEPARTMENT_OTHER): Payer: Medicare Other

## 2012-08-28 DIAGNOSIS — S82843A Displaced bimalleolar fracture of unspecified lower leg, initial encounter for closed fracture: Secondary | ICD-10-CM

## 2012-08-28 DIAGNOSIS — S0291XA Unspecified fracture of skull, initial encounter for closed fracture: Secondary | ICD-10-CM

## 2012-08-28 DIAGNOSIS — R42 Dizziness and giddiness: Secondary | ICD-10-CM | POA: Insufficient documentation

## 2012-08-28 DIAGNOSIS — W108XXA Fall (on) (from) other stairs and steps, initial encounter: Secondary | ICD-10-CM | POA: Insufficient documentation

## 2012-08-28 DIAGNOSIS — I10 Essential (primary) hypertension: Secondary | ICD-10-CM | POA: Insufficient documentation

## 2012-08-28 DIAGNOSIS — S0280XA Fracture of other specified skull and facial bones, unspecified side, initial encounter for closed fracture: Secondary | ICD-10-CM | POA: Insufficient documentation

## 2012-08-28 DIAGNOSIS — S060X9A Concussion with loss of consciousness of unspecified duration, initial encounter: Secondary | ICD-10-CM

## 2012-08-28 MED ORDER — ONDANSETRON 4 MG PO TBDP
4.0000 mg | ORAL_TABLET | Freq: Once | ORAL | Status: AC
  Administered 2012-08-28: 4 mg via ORAL

## 2012-08-28 MED ORDER — ONDANSETRON 4 MG PO TBDP
ORAL_TABLET | ORAL | Status: AC
  Administered 2012-08-28: 4 mg via ORAL
  Filled 2012-08-28: qty 1

## 2012-08-28 MED ORDER — ONDANSETRON 4 MG PO TBDP: 4.0000 mg | ORAL_TABLET | Freq: Three times a day (TID) | ORAL | Status: DC | PRN

## 2012-08-28 NOTE — ED Provider Notes (Signed)
History     CSN: [id number]  Arrival date & time 08/28/12  1123   First MD Initiated Contact with Patient 08/28/12 1150      Chief Complaint  Patient presents with  . Fall     HPI Patient tripped walking up stairs and fell backwards, hitting back of head, left ankle swollen, hurts to try to bear weight,..  Patient had no LOC.  Has had some dizziness and slight nausea but no vomiting.  Denies neck pain.  Past Medical History  Diagnosis Date  . MVP (mitral valve prolapse)   . Allergy     shell fish  . Hyperlipidemia   . Hypertension   . Arthritis     knees  . Cancer     right Chemo/radiation '01/radical '09  . Thyroid disease   . Family history of breast cancer     mother  . Breast cancer, stage 1 07/16/1990  . Breast cancer, right breast 02/16/2008  . Lymphedema of arm 11/24/2011    right arm  . Benign essential HTN 11/24/2011  . 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 PONV (postoperative nausea and vomiting)     has vertigo also-would like scop patch  . Anxiety     takes xanax on a rare occasion  . Vertigo   . Contact lens/glasses fitting     Past Surgical History  Procedure Date  . Thyroidectomy, partial   . Breast lumpectomy     2001 right  . Mastectomy, radical     2009 right  . Knee arthroscopy     left  . Foot neuroma surgery     left  . Colonoscopy   . Upper gastrointestinal endoscopy   . Foot amputation     right hammer toe  . Mass excision 07/21/2012    Procedure: EXCISION MASS;  Surgeon: Ernestene Mention, MD;  Location: [facility];  Service: General;  Laterality: N/A;  excision 15cm posterior neck mass posterior    Family History  Problem Relation Age of Onset  . Cancer Mother   . Heart disease Mother   . Heart disease Father   . Heart disease Brother     History  Substance Use Topics  . Smoking status: Never Smoker   . Smokeless tobacco: Never Used  . Alcohol Use: No    OB History    Grav Para Term Preterm Abortions TAB  SAB Ect Mult Living                  Review of Systems  All other systems reviewed and are negative.    Allergies  Codeine sulfate; Fish-derived products; Rocephin; and Sulfa antibiotics  Home Medications   Current Outpatient Rx  Name Route Sig Dispense Refill  . ALPRAZOLAM 0.5 MG PO TABS Oral Take 0.5 mg by mouth as needed.    . ASPIRIN 81 MG PO TBEC Oral Take 81 mg by mouth daily.      Marland Kitchen CALTRATE 600 PLUS-VIT D PO Oral Take 1 tablet by mouth daily.      Marland Kitchen CARVEDILOL 12.5 MG PO TABS Oral Take 12.5 mg by mouth 2 (two) times daily with a meal.      . VITAMIN D 1000 UNITS PO CAPS Oral Take 1,000 Units by mouth daily.      Marland Kitchen LETROZOLE 2.5 MG PO TABS Oral Take 1 tablet (2.5 mg total) by mouth daily. 90 tablet 3  . LISINOPRIL-HYDROCHLOROTHIAZIDE 20-25 MG PO TABS Oral Take 1 tablet by  mouth daily.      Marland Kitchen MECLIZINE HCL 25 MG PO TABS Oral Take 25 mg by mouth 3 (three) times daily as needed.    Marland Kitchen ONDANSETRON 4 MG PO TBDP Oral Take 1 tablet (4 mg total) by mouth every 8 (eight) hours as needed for nausea. 20 tablet 0  . SIMVASTATIN 5 MG PO TABS Oral Take 5 mg by mouth at bedtime.        BP 152/59  Temp 98.5 F (36.9 C)  Resp 20  SpO2 100%  Physical Exam  Nursing note and vitals reviewed. Constitutional: She is oriented to person, place, and time. She appears well-developed. No distress.  HENT:  Head: Normocephalic.         Abrasion as noted  Eyes: Pupils are equal, round, and reactive to light.  Neck: Normal range of motion and full passive range of motion without pain. No erythema present.  Cardiovascular: Normal rate and intact distal pulses.   Pulmonary/Chest: No respiratory distress.  Abdominal: Normal appearance. She exhibits no distension.  Musculoskeletal: Normal range of motion.  Neurological: She is alert and oriented to person, place, and time. No cranial nerve deficit.  Skin: Skin is warm and dry. No rash noted.  Psychiatric: She has a normal mood and affect. Her  behavior is normal.    ED Course  Procedures (including critical care time)  Labs Reviewed - No data to display Dg Ankle Complete Left  08/28/2012  *RADIOLOGY REPORT*  Clinical Data: Traumatic injury with pain and swelling  LEFT ANKLE COMPLETE - 3+ VIEW  Comparison: None.  Findings: There is a mildly displaced fracture of the distal aspect of the lateral malleolus.  Considerable soft tissue swelling is noted laterally.  There is a faint lucency identified within the medial malleolus consistent with an undisplaced fracture.  Some small bony densities are noted adjacent to the medial malleolus likely related to prior trauma.  No other focal abnormality is seen.  IMPRESSION: Medial and lateral malleolar fractures as described.   Original Report Authenticated By: Phillips Odor, M.D.    Ct Head Wo Contrast  08/28/2012  *RADIOLOGY REPORT*  Clinical Data: Fall, pain, hit back of head  CT HEAD WITHOUT CONTRAST  Technique:  Contiguous axial images were obtained from the base of the skull through the vertex without contrast.  Comparison: None.  Findings: No intracranial hemorrhage or extra-axial fluid.  No evidence of vascular territory infarct or mass.  There is mild age related atrophy.  There is a scalp hematoma in the left posterior parietal region.  There is a linear defect involving the left occipital bone and inferior left parietal bone.  IMPRESSION: Nondisplaced left parieto-occipital skull fracture.   Original Report Authenticated By: Otilio Carpen, M.D.      1. Bimalleolar ankle fracture   2. Skull fracture   3. Concussion       MDM  Cam walker will be applied.  Head injury precautions discussed.  Fracture care precautions discussed.        Nelia Shi, MD 08/28/12 1239

## 2012-08-28 NOTE — ED Notes (Signed)
Patient tripped walking up stairs and fell backwards, hitting back of head, left ankle swollen, hurts to try to bear weight, no loc, dizzy and nauseous at this time

## 2012-11-23 ENCOUNTER — Other Ambulatory Visit: Payer: Self-pay | Admitting: *Deleted

## 2012-11-23 DIAGNOSIS — C50519 Malignant neoplasm of lower-outer quadrant of unspecified female breast: Secondary | ICD-10-CM

## 2012-11-23 MED ORDER — LETROZOLE 2.5 MG PO TABS: 2.5000 mg | ORAL_TABLET | Freq: Every day | ORAL | Status: DC

## 2012-11-26 ENCOUNTER — Other Ambulatory Visit: Payer: Medicare Other | Admitting: Lab

## 2012-11-26 ENCOUNTER — Ambulatory Visit: Payer: Medicare Other | Admitting: Oncology

## 2013-01-18 ENCOUNTER — Ambulatory Visit (HOSPITAL_BASED_OUTPATIENT_CLINIC_OR_DEPARTMENT_OTHER): Payer: Medicare Other | Admitting: Oncology

## 2013-01-18 ENCOUNTER — Other Ambulatory Visit: Payer: Medicare Other | Admitting: Lab

## 2013-01-18 ENCOUNTER — Telehealth: Payer: Self-pay | Admitting: Oncology

## 2013-01-18 ENCOUNTER — Telehealth: Payer: Self-pay | Admitting: *Deleted

## 2013-01-18 VITALS — BP 127/63 | HR 52 | Temp 97.1°F | Resp 20 | Ht 69.0 in | Wt 219.6 lb

## 2013-01-18 DIAGNOSIS — C50911 Malignant neoplasm of unspecified site of right female breast: Secondary | ICD-10-CM

## 2013-01-18 DIAGNOSIS — I10 Essential (primary) hypertension: Secondary | ICD-10-CM

## 2013-01-18 DIAGNOSIS — N189 Chronic kidney disease, unspecified: Secondary | ICD-10-CM

## 2013-01-18 DIAGNOSIS — I890 Lymphedema, not elsewhere classified: Secondary | ICD-10-CM

## 2013-01-18 DIAGNOSIS — C50919 Malignant neoplasm of unspecified site of unspecified female breast: Secondary | ICD-10-CM

## 2013-01-18 NOTE — Patient Instructions (Signed)
OK to stop femara when current prescription runs out See you in 1 year

## 2013-01-18 NOTE — Telephone Encounter (Signed)
Per staff phone call and POF I have schedueld appts.  JMW  

## 2013-01-18 NOTE — Telephone Encounter (Signed)
gv and printed appt schedule for July 2014 and March2015....michelle add zometa that was put in under suppoetive care

## 2013-01-19 NOTE — Progress Notes (Signed)
Hematology and Oncology Follow Up Visit  Jennifer Yang [id number] 1941-06-23 72 y.o. 01/19/2013 9:24 AM   Principle Diagnosis: Encounter Diagnoses  Name Primary?  . Breast cancer, right breast Yes  . 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 Initial T1 N0 ER positive lesion right lower quadrant treated with lumpectomy 07/16/1990 1.5 cm primary. She received 6 cycles of adjuvant CMF chemotherapy than 2 years of tamoxifen. She developed a second primary in the same breast April 2009 1.8 cm ER positive PR weakly positive HER-2 negative low-grade but positive vascular and lymphatic invasion. She had a right mastectomy at that time. Genetic testing was done and an Oncotype DX gene analysis showed intermediate risk for recurrence but she declined any further chemotherapy. She was started on Femara hormonal therapy and is now been on the drug for 3-1/2 years.  She has had problems over the years with intermittent cellulitis of her right arm due to right upper extremity lymphedema which she developed subsequent to her surgeries. No recent flareups.  She's had no interim medical problems. She denies any bone pain. No headache or change in vision. No abdominal pain or change in bowel habit. No vaginal bleeding. She had a left mamm[facility] last week at [facility] and was told that this showed no change. I do not have the official report yet.   Medications: reviewed  Allergies:  Allergies  Allergen Reactions  . Codeine Sulfate Swelling  . Fish-Derived Products Other (See Comments)    Cellulitis   . Rocephin (Ceftriaxone Sodium) Other (See Comments)    Low blood count  . Sulfa Antibiotics Other (See Comments)    Mouth and lip sores    Review of Systems: Constitutional:   No constitutional symptoms Respiratory: No cough or dyspnea Cardiovascular:  No chest pain or  palpitations Gastrointestinal: See above Genito-Urinary: See above Musculoskeletal: See above Neurologic: See above Skin: No rash Remaining ROS negative.  Physical Exam: Blood pressure 127/63, pulse 52, temperature 97.1 F (36.2 C), temperature source Oral, resp. rate 20, height 5\' 9"  (1.753 m), weight 219 lb 9.6 oz (99.61 kg). Wt Readings from Last 3 Encounters:  01/18/13 219 lb 9.6 oz (99.61 kg)  07/28/12 223 lb 4 oz (101.266 kg)  07/21/12 222 lb (100.699 kg)     General appearance: Well-nourished Caucasian woman HENNT: Pharynx no erythema or exudate Lymph nodes: No cervical, supraclavicular, or axillary adenopathy Breasts: Right mastectomy, no chest wall lesions, no dominant left breast mass Lungs: Clear to auscultation resonant to percussion Heart: Regular rhythm no murmur Abdomen: Soft, obese, nontender, no mass, no organomegaly Extremities: No edema, no calf tenderness Vascular: No cyanosis Neurologic: She is alert and oriented, motor strength 5 over 5, reflexes absent symmetric at the knees, 1+ symmetric at the biceps Skin: No rash or ecchymosis  Lab Results: Lab Results  Component Value Date   WBC 6.6 10/24/2011   HGB 12.1 07/21/2012   HCT 34.7* 10/24/2011   MCV 90.1 10/24/2011   PLT 148 10/24/2011     Chemistry      Component Value Date/Time   NA 142 07/16/2012 1200   K 4.5 07/16/2012 1200   CL 104 07/16/2012 1200   CO2 28 07/16/2012 1200   BUN 25* 07/16/2012 1200   CREATININE 1.33* 07/16/2012 1200      Component Value Date/Time   CALCIUM 10.0 07/16/2012 1200  ALKPHOS 38* 05/27/2012 0907   AST 15 05/27/2012 0907   ALT 14 05/27/2012 0907   BILITOT 0.6 05/27/2012 4782       Radiological Studies: See discussion above .  Impression and Plan:  #1. 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 She remains free of any new disease now out 23 years from initial cancer and almost 5 years from second primary.  Plan:  Continue Femara through April of this year to complete 5 years then discontinue.   #2. Chronic lymphedema right upper extremity second of breast cancer surgery.   #3. History of recurrent cellulitis right upper arms secondary to #2.   #4. Essential hypertension controlled on current medications.   #5. 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 Chronic renal insufficiency with creatinines running 1.4-1.6 over the last 2 years. I told her that I would no longer be getting routine laboratory studies with respect to breast cancer followup since they have not been shown to predict early recurrence. She will continue to follow with her primary care physician.  .   CC:. Dr. Shary Decamp; Dr. Claud Kelp; Dr. Tawanna Cooler Meisenger   Levert Feinstein, MD 3/5/20149:24 AM

## 2013-01-25 ENCOUNTER — Encounter (INDEPENDENT_AMBULATORY_CARE_PROVIDER_SITE_OTHER): Payer: Self-pay

## 2013-02-01 ENCOUNTER — Encounter: Payer: Self-pay | Admitting: Oncology

## 2013-05-24 ENCOUNTER — Ambulatory Visit (HOSPITAL_BASED_OUTPATIENT_CLINIC_OR_DEPARTMENT_OTHER): Payer: Medicare Other | Admitting: Lab

## 2013-05-24 ENCOUNTER — Other Ambulatory Visit: Payer: Self-pay | Admitting: Oncology

## 2013-05-24 ENCOUNTER — Ambulatory Visit (HOSPITAL_BASED_OUTPATIENT_CLINIC_OR_DEPARTMENT_OTHER): Payer: Medicare Other

## 2013-05-24 VITALS — BP 152/70 | HR 59 | Temp 98.0°F

## 2013-05-24 DIAGNOSIS — C50919 Malignant neoplasm of unspecified site of unspecified female breast: Secondary | ICD-10-CM

## 2013-05-24 DIAGNOSIS — M818 Other osteoporosis without current pathological fracture: Secondary | ICD-10-CM

## 2013-05-24 DIAGNOSIS — T50905A Adverse effect of unspecified drugs, medicaments and biological substances, initial encounter: Secondary | ICD-10-CM

## 2013-05-24 DIAGNOSIS — C50519 Malignant neoplasm of lower-outer quadrant of unspecified female breast: Secondary | ICD-10-CM

## 2013-05-24 DIAGNOSIS — C50911 Malignant neoplasm of unspecified site of right female breast: Secondary | ICD-10-CM

## 2013-05-24 LAB — BASIC METABOLIC PANEL (CC13)
CO2: 27 mEq/L (ref 22–29)
Calcium: 9.8 mg/dL (ref 8.4–10.4)
Chloride: 106 mEq/L (ref 98–109)
Glucose: 102 mg/dl (ref 70–140)
Potassium: 3.9 mEq/L (ref 3.5–5.1)
Sodium: 144 mEq/L (ref 136–145)

## 2013-05-24 MED ORDER — SODIUM CHLORIDE 0.9 % IV SOLN
Freq: Once | INTRAVENOUS | Status: AC
  Administered 2013-05-24: 09:00:00 via INTRAVENOUS

## 2013-05-24 MED ORDER — ZOLEDRONIC ACID 4 MG/100ML IV SOLN
4.0000 mg | Freq: Once | INTRAVENOUS | Status: AC
  Administered 2013-05-24: 4 mg via INTRAVENOUS
  Filled 2013-05-24: qty 100

## 2013-05-24 NOTE — Patient Instructions (Signed)
Zoledronic Acid injection (Hypercalcemia, Oncology) What is this medicine? ZOLEDRONIC ACID (ZOE le dron ik AS id) lowers the amount of calcium loss from bone. It is used to treat too much calcium in your blood from cancer. It is also used to prevent complications of cancer that has spread to the bone. This medicine may be used for other purposes; ask your health care provider or pharmacist if you have questions. What should I tell my health care provider before I take this medicine? They need to know if you have any of these conditions: -aspirin-sensitive asthma -dental disease -kidney disease -an unusual or allergic reaction to zoledronic acid, other medicines, foods, dyes, or preservatives -pregnant or trying to get pregnant -breast-feeding How should I use this medicine? This medicine is for infusion into a vein. It is given by a health care professional in a hospital or clinic setting. Talk to your pediatrician regarding the use of this medicine in children. Special care may be needed. Overdosage: If you think you have taken too much of this medicine contact a poison control center or emergency room at once. NOTE: This medicine is only for you. Do not share this medicine with others. What if I miss a dose? It is important not to miss your dose. Call your doctor or health care professional if you are unable to keep an appointment. What may interact with this medicine? -certain antibiotics given by injection -NSAIDs, medicines for pain and inflammation, like ibuprofen or naproxen -some diuretics like bumetanide, furosemide -teriparatide -thalidomide This list may not describe all possible interactions. Give your health care provider a list of all the medicines, herbs, non-prescription drugs, or dietary supplements you use. Also tell them if you smoke, drink alcohol, or use illegal drugs. Some items may interact with your medicine. What should I watch for while using this medicine? Visit  your doctor or health care professional for regular checkups. It may be some time before you see the benefit from this medicine. Do not stop taking your medicine unless your doctor tells you to. Your doctor may order blood tests or other tests to see how you are doing. Women should inform their doctor if they wish to become pregnant or think they might be pregnant. There is a potential for serious side effects to an unborn child. Talk to your health care professional or pharmacist for more information. You should make sure that you get enough calcium and vitamin D while you are taking this medicine. Discuss the foods you eat and the vitamins you take with your health care professional. Some people who take this medicine have severe bone, joint, and/or muscle pain. This medicine may also increase your risk for a broken thigh bone. Tell your doctor right away if you have pain in your upper leg or groin. Tell your doctor if you have any pain that does not go away or that gets worse. What side effects may I notice from receiving this medicine? Side effects that you should report to your doctor or health care professional as soon as possible: -allergic reactions like skin rash, itching or hives, swelling of the face, lips, or tongue -anxiety, confusion, or depression -breathing problems -changes in vision -feeling faint or lightheaded, falls -jaw burning, cramping, pain -muscle cramps, stiffness, or weakness -trouble passing urine or change in the amount of urine Side effects that usually do not require medical attention (report to your doctor or health care professional if they continue or are bothersome): -bone, joint, or muscle pain -  fever -hair loss -irritation at site where injected -loss of appetite -nausea, vomiting -stomach upset -tired This list may not describe all possible side effects. Call your doctor for medical advice about side effects. You may report side effects to FDA at  1-800-FDA-1088. Where should I keep my medicine? This drug is given in a hospital or clinic and will not be stored at home. NOTE: This sheet is a summary. It may not cover all possible information. If you have questions about this medicine, talk to your doctor, pharmacist, or health care provider.  2013, [employer]. (05/02/2011 9:06:58 AM)  

## 2013-06-22 ENCOUNTER — Other Ambulatory Visit: Payer: Self-pay

## 2013-07-22 ENCOUNTER — Ambulatory Visit (INDEPENDENT_AMBULATORY_CARE_PROVIDER_SITE_OTHER): Payer: Medicare Other | Admitting: General Surgery

## 2013-07-22 ENCOUNTER — Encounter (INDEPENDENT_AMBULATORY_CARE_PROVIDER_SITE_OTHER): Payer: Self-pay | Admitting: General Surgery

## 2013-07-22 VITALS — BP 130/74 | HR 60 | Temp 98.0°F | Resp 14 | Ht 69.0 in | Wt 178.4 lb

## 2013-07-22 DIAGNOSIS — C50911 Malignant neoplasm of unspecified site of right female breast: Secondary | ICD-10-CM

## 2013-07-22 DIAGNOSIS — C50919 Malignant neoplasm of unspecified site of unspecified female breast: Secondary | ICD-10-CM

## 2013-07-22 NOTE — Progress Notes (Signed)
Patient ID: Jennifer Yang, female   DOB: 1941-07-07, 72 y.o.   MRN: [id number]   History: This patient returns for long-term followup for breast cancer. She has a history of right breast cancer with lumpectomy and lymph node dissection 1991. She has history of recurrent right breast cancer with right total mastectomy 2009. She is followed by Dr. Cyndie Chime  and took 5 years of Femara which ihas now been discontinued. She requested I continue to see her annually. She has been able to achieve a 40 pound weight loss with Jennifer Yang guidance has come off of all of her hypertensive medications. She'll takes an aspirin a day now Left mammogram at [facility] on 01/13/2013 is normal, category 1. She has no complaints about her mastectomy wound or left breast.  Exam: Patient looks good. No distress. Posterior neck incision from large lipoma is well healed. Neck reveals no adenopathy or mass Lungs are clear to auscultation bilaterally Heart regular rate and rhythm no murmur no ectopy Breasts: Right mastectomy wound well healed. No nodules or ulcerations. No recurrence. Left breast feels normal.  Skin OK, no palpable mass, no adenopathy  Assessment: Invasive cancer right breast. Status post partial mastectomy and axillary lymph node dissection 1991 for receptor positive, T1c,N0.Marland Kitchen Cancer treated with CMF and 5 years of tamoxifen.  Invasive cancer right breast, recurrence 2009, treated with salvage right mastectomy, 1.8 cm tumor, receptor positive, HER-2-negative. Completed 5 years  Femara. No recurrence 5 years postop  Normal mammogram left breast, 01/13/2013 15 cm lipoma right posterior neck, healed following excision Hypertension well controlled now. Mitral valve prolapse  Plan: Continue medical followup with Dr. Thea Silversmith and medical oncology followup Dr. Cyndie Chime Left breast mammogram in February 2015 Return to see me in one year.   Angelia Mould. Derrell Lolling, M.D., [facility] General and Minimally invasive Surgery Breast and Colorectal Surgery Office:   [phone] Pager:   [phone]

## 2013-07-22 NOTE — Patient Instructions (Signed)
Examination of your lymph nodes, right mastectomy site, and left breast reveal no evidence of cancer. Your mammograms in February were also normal. Your other wounds have healed.  Return to see Dr. Derrell Lolling in one year.  Be sure to get your left breast mammogram in February of 2015.

## 2013-07-27 ENCOUNTER — Other Ambulatory Visit: Payer: Self-pay | Admitting: *Deleted

## 2013-07-27 DIAGNOSIS — I890 Lymphedema, not elsewhere classified: Secondary | ICD-10-CM

## 2013-07-27 MED ORDER — AZITHROMYCIN 250 MG PO TABS: ORAL_TABLET | ORAL | Status: DC

## 2013-07-27 NOTE — Telephone Encounter (Signed)
Patient called requesting script for Z-pack.  She is going out of town and would like to have on hand in case she has an infection related to her lymphedema. She is requesting [employer].   OK per Dr. Cyndie Chime.Called patient and let her know that script was e-scribed to [employer].

## 2013-09-22 ENCOUNTER — Other Ambulatory Visit: Payer: Self-pay

## 2013-09-23 ENCOUNTER — Other Ambulatory Visit (INDEPENDENT_AMBULATORY_CARE_PROVIDER_SITE_OTHER): Payer: Self-pay | Admitting: *Deleted

## 2013-09-23 MED ORDER — UNABLE TO FIND: Status: DC

## 2014-01-14 ENCOUNTER — Encounter: Payer: Self-pay | Admitting: Oncology

## 2014-01-24 ENCOUNTER — Ambulatory Visit (HOSPITAL_BASED_OUTPATIENT_CLINIC_OR_DEPARTMENT_OTHER): Payer: Medicare Other | Admitting: Oncology

## 2014-01-24 ENCOUNTER — Telehealth: Payer: Self-pay | Admitting: Hematology & Oncology

## 2014-01-24 VITALS — BP 171/77 | HR 51 | Temp 98.0°F | Resp 18 | Ht 69.0 in | Wt 167.6 lb

## 2014-01-24 DIAGNOSIS — I129 Hypertensive chronic kidney disease with stage 1 through stage 4 chronic kidney disease, or unspecified chronic kidney disease: Secondary | ICD-10-CM

## 2014-01-24 DIAGNOSIS — D649 Anemia, unspecified: Secondary | ICD-10-CM

## 2014-01-24 DIAGNOSIS — C50911 Malignant neoplasm of unspecified site of right female breast: Secondary | ICD-10-CM

## 2014-01-24 DIAGNOSIS — Z853 Personal history of malignant neoplasm of breast: Secondary | ICD-10-CM

## 2014-01-24 DIAGNOSIS — C50919 Malignant neoplasm of unspecified site of unspecified female breast: Secondary | ICD-10-CM

## 2014-01-24 DIAGNOSIS — I890 Lymphedema, not elsewhere classified: Secondary | ICD-10-CM

## 2014-01-24 NOTE — Progress Notes (Signed)
Hematology and Oncology Follow Up Visit  Jennifer Yang [id number] Feb 19, 1941 73 y.o. 01/24/2014 1:07 PM   Principle Diagnosis: Encounter Diagnoses  Name Primary?  . Breast cancer, stage 1   . Breast cancer, right breast Yes  . 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 Initial T1 N0, ER positive, lesion right lower quadrant treated with lumpectomy 07/16/1990 1.5 cm primary. She received 6 cycles of adjuvant CMF chemotherapy then 2 years of tamoxifen. She developed a second primary in the same breast April 2009, 1.8 cm, ER positive PR weakly positive, HER-2 negative, low-grade but positive vascular and lymphatic invasion. She had a right mastectomy at that time. Genetic testing was done and an Oncotype DX gene analysis showed intermediate risk for recurrence but she declined any further chemotherapy. She was started on Femara hormonal therapy which she took for 5 years through April 2014.  She has had problems over the years with intermittent cellulitis of her right arm due to right upper extremity lymphedema which she developed subsequent to her surgeries. No recent flareups.  She's had no interim medical problems. She went on a medically directed diet through her internist and states she has lost about 60 pounds. She denies any severe headache, bone pain, change in bowel habit, vaginal bleeding.   Medications: reviewed  Allergies:  Allergies  Allergen Reactions  . Codeine Sulfate Swelling  . Fish-Derived Products Other (See Comments)    Cellulitis   . Rocephin [Ceftriaxone Sodium] Other (See Comments)    Low blood count  . Sulfa Antibiotics Other (See Comments)    Mouth and lip sores    Review of Systems: Hematology:  No bleeding or bruising ENT ROS: No sore throat Breast ROS: No left breast lumps Respiratory ROS: No cough or dyspnea Cardiovascular ROS:  No  chest pain or palpitations Gastrointestinal ROS:   No abdominal pain or change in bowel habit. Normal colonoscopy 7 years ago. 10 year interval schedule next study not due until 2017 Genito-Urinary ROS: See above Musculoskeletal ROS: No muscle bone or joint pain Neurological ROS: No headache or change in vision Dermatological ROS: No rash or ecchymosis Remaining ROS negative:   Physical Exam: Blood pressure 171/77, pulse 51, temperature 98 F (36.7 C), temperature source Oral, resp. rate 18, height '5\' 9"'  (1.753 m), weight 167 lb 9.6 oz (76.023 kg), SpO2 97.00%. Wt Readings from Last 3 Encounters:  01/24/14 167 lb 9.6 oz (76.023 kg)  07/22/13 178 lb 6.4 oz (80.922 kg)  01/18/13 219 lb 9.6 oz (99.61 kg)     General appearance: Well-nourished Caucasian woman who has lost a significant amount of weight on a medically directed diet HENNT: Pharynx no erythema, exudate, mass, or ulcer. No thyromegaly or thyroid nodules, scar in the mid neck from previous thyroidectomy Lymph nodes: No cervical, supraclavicular, or axillary lymphadenopathy Breasts: Right mastectomy. No chest wall lesions. No left breast masses. Lungs: Clear to auscultation, resonant to percussion throughout Heart: Regular rhythm, no murmur, no gallop, no rub, no click, no edema Abdomen: Soft, nontender, normal bowel sounds, no mass, no organomegaly Extremities: No edema, no calf tenderness Musculoskeletal: no joint deformities GU:  Vascular: Carotid pulses 2+, no bruits, distal pulses: Dorsalis pedis 1+ symmetric Neurologic: Alert, oriented, PERRLA,  , cranial nerves grossly normal, motor strength 5 over 5, reflexes 1+ symmetric, upper body coordination normal, gait normal, Skin: No rash or ecchymosis  Most recent left mammogram done on 01/16/2014 at [place] no new disease  Lab Results: CBC W/Diff    Component Value Date/Time   WBC 6.6 10/24/2011 1240   WBC 6.1 03/02/2008 0925   RBC 3.85 10/24/2011 1240   RBC 3.85*  03/02/2008 0925   HGB 12.1 07/21/2012 0719   HGB 11.4* 10/24/2011 1240   HCT 34.7* 10/24/2011 1240   HCT 34.5* 03/02/2008 0925   PLT 148 10/24/2011 1240   PLT 176 03/02/2008 0925   MCV 90.1 10/24/2011 1240   MCV 89.8 03/02/2008 0925   MCH 29.6 10/24/2011 1240   MCHC 32.9 10/24/2011 1240   MCHC 34.9 03/02/2008 0925   RDW 12.6 10/24/2011 1240   RDW 12.8 03/02/2008 0925   LYMPHSABS 2.1 10/24/2011 1240   LYMPHSABS 1.5 03/02/2008 0925   MONOABS 0.6 10/24/2011 1240   MONOABS 0.5 03/02/2008 0925   EOSABS 0.1 10/24/2011 1240   EOSABS 0.1 03/02/2008 0925   BASOSABS 0.0 10/24/2011 1240   BASOSABS 0.0 03/02/2008 0925     Chemistry      Component Value Date/Time   NA 144 05/24/2013 0959   NA 142 07/16/2012 1200   K 3.9 05/24/2013 0959   K 4.5 07/16/2012 1200   CL 104 07/16/2012 1200   CO2 27 05/24/2013 0959   CO2 28 07/16/2012 1200   BUN 19.7 05/24/2013 0959   BUN 25* 07/16/2012 1200   CREATININE 1.1 05/24/2013 0959   CREATININE 1.33* 07/16/2012 1200      Component Value Date/Time   CALCIUM 9.8 05/24/2013 0959   CALCIUM 10.0 07/16/2012 1200   ALKPHOS 38* 05/27/2012 0907   AST 15 05/27/2012 0907   ALT 14 05/27/2012 0907   BILITOT 0.6 05/27/2012 6728       Radiological Studies: See discussion above .  Impression:   #1. Metachronous primary stage I, ER positive, cancers of the right breast treated as outlined above. She remains free of any new disease now out 24 years from initial cancer and almost 6 years from second primary.    #2. Chronic lymphedema right upper extremity second of breast cancer surgery.  #3. History of recurrent cellulitis right upper arms secondary to #2.  #4. Essential hypertension controlled on current medications.  #5. 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 Chronic renal insufficiency with creatinines running 1.4-1.6 over the last 3 years.   She would like to continue annual visits with medical oncology. I will transition her care to Dr. Marin Olp.   CC: Patient Care  Team: Thressa Sheller, MD as PCP - General (Internal Medicine)   Annia Belt, MD 3/10/20151:07 PM

## 2014-01-24 NOTE — Telephone Encounter (Signed)
Pt will now be seeing Dr. Marin Olp @ [facility], called Lil[facility], left message

## 2014-01-25 ENCOUNTER — Telehealth: Payer: Self-pay | Admitting: Hematology & Oncology

## 2014-01-25 NOTE — Telephone Encounter (Signed)
Received message from [facility] to schedule in a year. Per Dr. Marin Olp he wanted to see her in 6 months. When I called to give schedule pt said she needed zometa in July. Per Dr. Marin Olp I scheduled MD and infusion in July. Pt aware

## 2014-05-25 ENCOUNTER — Other Ambulatory Visit: Payer: Self-pay | Admitting: *Deleted

## 2014-05-25 DIAGNOSIS — C50911 Malignant neoplasm of unspecified site of right female breast: Secondary | ICD-10-CM

## 2014-05-26 ENCOUNTER — Ambulatory Visit (HOSPITAL_BASED_OUTPATIENT_CLINIC_OR_DEPARTMENT_OTHER): Payer: Medicare Other

## 2014-05-26 ENCOUNTER — Encounter: Payer: Self-pay | Admitting: Hematology & Oncology

## 2014-05-26 ENCOUNTER — Other Ambulatory Visit: Payer: Self-pay

## 2014-05-26 ENCOUNTER — Other Ambulatory Visit (HOSPITAL_BASED_OUTPATIENT_CLINIC_OR_DEPARTMENT_OTHER): Payer: Medicare Other | Admitting: Lab

## 2014-05-26 ENCOUNTER — Ambulatory Visit (HOSPITAL_BASED_OUTPATIENT_CLINIC_OR_DEPARTMENT_OTHER): Payer: Medicare Other | Admitting: Hematology & Oncology

## 2014-05-26 VITALS — BP 177/67 | HR 54 | Temp 97.7°F | Resp 14 | Ht 69.0 in | Wt 165.0 lb

## 2014-05-26 DIAGNOSIS — C50911 Malignant neoplasm of unspecified site of right female breast: Secondary | ICD-10-CM

## 2014-05-26 DIAGNOSIS — C50919 Malignant neoplasm of unspecified site of unspecified female breast: Secondary | ICD-10-CM

## 2014-05-26 DIAGNOSIS — Z853 Personal history of malignant neoplasm of breast: Secondary | ICD-10-CM

## 2014-05-26 DIAGNOSIS — M818 Other osteoporosis without current pathological fracture: Secondary | ICD-10-CM

## 2014-05-26 DIAGNOSIS — T50905A Adverse effect of unspecified drugs, medicaments and biological substances, initial encounter: Principal | ICD-10-CM

## 2014-05-26 DIAGNOSIS — D225 Melanocytic nevi of trunk: Secondary | ICD-10-CM

## 2014-05-26 DIAGNOSIS — I890 Lymphedema, not elsewhere classified: Secondary | ICD-10-CM

## 2014-05-26 DIAGNOSIS — D487 Neoplasm of uncertain behavior of other specified sites: Secondary | ICD-10-CM

## 2014-05-26 LAB — CMP (CANCER CENTER ONLY)
ALBUMIN: 3.9 g/dL (ref 3.3–5.5)
ALT(SGPT): 16 U/L (ref 10–47)
AST: 24 U/L (ref 11–38)
Alkaline Phosphatase: 48 U/L (ref 26–84)
BUN: 16 mg/dL (ref 7–22)
CALCIUM: 9.2 mg/dL (ref 8.0–10.3)
CHLORIDE: 103 meq/L (ref 98–108)
CO2: 29 mEq/L (ref 18–33)
Creat: 0.9 mg/dl (ref 0.6–1.2)
Glucose, Bld: 89 mg/dL (ref 73–118)
POTASSIUM: 3.8 meq/L (ref 3.3–4.7)
Sodium: 142 mEq/L (ref 128–145)
Total Bilirubin: 1 mg/dl (ref 0.20–1.60)
Total Protein: 6.8 g/dL (ref 6.4–8.1)

## 2014-05-26 LAB — CBC WITH DIFFERENTIAL (CANCER CENTER ONLY)
BASO#: 0 10*3/uL (ref 0.0–0.2)
BASO%: 0.4 % (ref 0.0–2.0)
EOS%: 1.4 % (ref 0.0–7.0)
Eosinophils Absolute: 0.1 10*3/uL (ref 0.0–0.5)
HEMATOCRIT: 40.4 % (ref 34.8–46.6)
HGB: 13.6 g/dL (ref 11.6–15.9)
LYMPH#: 1.5 10*3/uL (ref 0.9–3.3)
LYMPH%: 29.9 % (ref 14.0–48.0)
MCH: 30.8 pg (ref 26.0–34.0)
MCHC: 33.7 g/dL (ref 32.0–36.0)
MCV: 91 fL (ref 81–101)
MONO#: 0.4 10*3/uL (ref 0.1–0.9)
MONO%: 8.3 % (ref 0.0–13.0)
NEUT#: 3 10*3/uL (ref 1.5–6.5)
NEUT%: 60 % (ref 39.6–80.0)
Platelets: 137 10*3/uL — ABNORMAL LOW (ref 145–400)
RBC: 4.42 10*6/uL (ref 3.70–5.32)
RDW: 12.6 % (ref 11.1–15.7)
WBC: 5.1 10*3/uL (ref 3.9–10.0)

## 2014-05-26 MED ORDER — ZOLEDRONIC ACID 4 MG/100ML IV SOLN
4.0000 mg | Freq: Once | INTRAVENOUS | Status: AC
  Administered 2014-05-26: 4 mg via INTRAVENOUS
  Filled 2014-05-26: qty 100

## 2014-05-26 MED ORDER — SODIUM CHLORIDE 0.9 % IV SOLN: Freq: Once | INTRAVENOUS | Status: DC

## 2014-05-26 MED ORDER — AZITHROMYCIN 250 MG PO TABS: ORAL_TABLET | ORAL | Status: DC

## 2014-05-26 NOTE — Patient Instructions (Signed)

## 2014-05-31 ENCOUNTER — Telehealth: Payer: Self-pay | Admitting: *Deleted

## 2014-05-31 NOTE — Telephone Encounter (Addendum)
Message copied by Lenn Sink on Wed May 31, 2014 12:44 PM ------      Message from: Burney Gauze R      Created: Wed May 31, 2014 11:07 AM       Call - labs are ok!!  Jennifer Yang ------Informed pt that labs are okay.

## 2014-06-01 NOTE — Progress Notes (Signed)
Hematology and Oncology Follow Up Visit  OCIA SIMEK [id number] 11/07/1941 73 y.o. 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   ER(+)  Current Therapy:    observation     Interim History:  Ms.  Jennifer Yang is in for her first followup visit. She was to follow previously by Dr. Beryle Beams. Since he has left, she now is seeing [place].  She has been doing okay. She has had no complaints since she was last seen.she has had no problem cough. She's had no cellulitis. She has had no nausea vomiting. His been no change in bowel or bladder habits. She's had no leg swelling. She's had no rash. There has been no headache. She has had no cough or shortness of breath.  Her last mammogram of the left breast was back in March and this was okay.  Medications: Current outpatient prescriptions:ALPRAZolam (XANAX) 0.5 MG tablet, Take 0.5 mg by mouth as needed., Disp: , Rfl: ;  aspirin 81 MG EC tablet, Take 81 mg by mouth daily.  , Disp: , Rfl: ;  Calcium-Vitamin D (CALTRATE 600 PLUS-VIT D PO), Take 1 tablet by mouth daily.  , Disp: , Rfl: ;  Cholecalciferol (VITAMIN D) 1000 UNITS capsule, Take 2,000 Units by mouth daily. , Disp: , Rfl: ;  furosemide (LASIX) 20 MG tablet, daily. As needed, Disp: , Rfl:  meclizine (ANTIVERT) 25 MG tablet, Take 25 mg by mouth 3 (three) times daily as needed., Disp: , Rfl: ;  ondansetron (ZOFRAN ODT) 4 MG disintegrating tablet, Take 1 tablet (4 mg total) by mouth every 8 (eight) hours as needed for nausea., Disp: 20 tablet, Rfl: 0;  azithromycin (ZITHROMAX Z-PAK) 250 MG tablet, As directed, Disp: 6 each, Rfl: 0  Allergies:  Allergies  Allergen Reactions  . Codeine Sulfate Swelling  . Fish-Derived Products Other (See Comments)    Cellulitis   . Rocephin [Ceftriaxone Sodium] Other (See Comments)    Low blood count  . Sulfa Antibiotics Other (See Comments)    Mouth  and lip sores    Past Medical History, Surgical history, Social history, and Family History were reviewed and updated.  Review of Systems: As above  Physical Exam:  height is 5\' 9"  (1.753 m) and weight is 165 lb (74.844 kg). Her oral temperature is 97.7 F (36.5 C). Her blood pressure is 177/67 and her pulse is 54. Her respiration is 14.   Well-developed and well-nourished white female. Her head and neck exam is nausea or oral lesion. She is no palpable cervical or supraclavicular lymph nodes. Lungs are clear a lot. Cardiac exam regular rate and rhythm with a normal S1 and S2. Breast exam shows left breast with no masses edema or erythema. There is no left axillary adenopathy. Right chest wall shows a well-healed mastectomy.there is no nodule. There is no erythema. There is no right axillary adenopathy. Abdomen soft. Has good bowel sounds. There is no fluid wave. No palpable liver or spleen tip. Back exam shows no tenderness over the spine ribs or hips. There is no kyphosis. Extremities shows no clubbing cyanosis or edema. Maybe some slight lymphedema of the right arm. She's good range of motion of the joints. Skin exam shows a suspicious hyperpigmented lesion on the back. This is in the mid back. This is just to the right of the spine. Neurological exam is nonfocal.  Lab Results  Component Value Date   WBC 5.1 05/26/2014   HGB 13.6 05/26/2014   HCT 40.4 05/26/2014   MCV 91 05/26/2014   PLT 137* 05/26/2014     Chemistry      Component Value Date/Time   NA 142 05/26/2014 0945   NA 144 05/24/2013 0959   NA 142 07/16/2012 1200   K 3.8 05/26/2014 0945   K 3.9 05/24/2013 0959   K 4.5 07/16/2012 1200   CL 103 05/26/2014 0945   CL 104 07/16/2012 1200   CO2 29 05/26/2014 0945   CO2 27 05/24/2013 0959   CO2 28 07/16/2012 1200   BUN 16 05/26/2014 0945   BUN 19.7 05/24/2013 0959   BUN 25* 07/16/2012 1200   CREATININE 0.9 05/26/2014 0945   CREATININE 1.1 05/24/2013 0959   CREATININE 1.33* 07/16/2012 1200       Component Value Date/Time   CALCIUM 9.2 05/26/2014 0945   CALCIUM 9.8 05/24/2013 0959   CALCIUM 10.0 07/16/2012 1200   ALKPHOS 48 05/26/2014 0945   ALKPHOS 38* 05/27/2012 0907   AST 24 05/26/2014 0945   AST 15 05/27/2012 0907   ALT 16 05/26/2014 0945   ALT 14 05/27/2012 0907   BILITOT 1.00 05/26/2014 0945   BILITOT 0.6 05/27/2012 0907         Impression and Plan: Ms. Jennifer Yang is 73 year old white female with multiple right breast cancers. She's had 2 separate primaries. They're both stage I. They're both  ER positive. She underwent  a mastectomy.  I am a little worried about this lesion on her back. It is darker than the others.  i think that this needs to be evaluated by Dermatology.  I will make the referral.  Otherwise we will see her back in 6 months.  [name], MD 7/16/20157:09 AM

## 2014-06-02 ENCOUNTER — Other Ambulatory Visit: Payer: Self-pay

## 2014-06-21 ENCOUNTER — Other Ambulatory Visit: Payer: Self-pay | Admitting: Dermatology

## 2014-07-19 ENCOUNTER — Other Ambulatory Visit: Payer: Medicare Other | Admitting: Lab

## 2014-07-19 ENCOUNTER — Ambulatory Visit: Payer: Medicare Other | Admitting: Hematology & Oncology

## 2014-08-09 ENCOUNTER — Ambulatory Visit (INDEPENDENT_AMBULATORY_CARE_PROVIDER_SITE_OTHER): Payer: Medicare Other | Admitting: General Surgery

## 2014-11-30 ENCOUNTER — Other Ambulatory Visit (HOSPITAL_BASED_OUTPATIENT_CLINIC_OR_DEPARTMENT_OTHER): Payer: Medicare Other | Admitting: Lab

## 2014-11-30 ENCOUNTER — Encounter: Payer: Self-pay | Admitting: Hematology & Oncology

## 2014-11-30 ENCOUNTER — Ambulatory Visit: Payer: Medicare Other

## 2014-11-30 ENCOUNTER — Ambulatory Visit (HOSPITAL_BASED_OUTPATIENT_CLINIC_OR_DEPARTMENT_OTHER): Payer: Medicare Other | Admitting: Hematology & Oncology

## 2014-11-30 VITALS — BP 174/65 | HR 52 | Temp 97.9°F | Resp 16 | Wt 172.0 lb

## 2014-11-30 DIAGNOSIS — Z853 Personal history of malignant neoplasm of breast: Secondary | ICD-10-CM

## 2014-11-30 DIAGNOSIS — I890 Lymphedema, not elsewhere classified: Secondary | ICD-10-CM

## 2014-11-30 DIAGNOSIS — C50911 Malignant neoplasm of unspecified site of right female breast: Secondary | ICD-10-CM

## 2014-11-30 DIAGNOSIS — C50912 Malignant neoplasm of unspecified site of left female breast: Secondary | ICD-10-CM

## 2014-11-30 DIAGNOSIS — T50905A Adverse effect of unspecified drugs, medicaments and biological substances, initial encounter: Secondary | ICD-10-CM

## 2014-11-30 DIAGNOSIS — D225 Melanocytic nevi of trunk: Secondary | ICD-10-CM

## 2014-11-30 DIAGNOSIS — M818 Other osteoporosis without current pathological fracture: Secondary | ICD-10-CM

## 2014-11-30 LAB — CBC WITH DIFFERENTIAL (CANCER CENTER ONLY)
BASO#: 0 10*3/uL (ref 0.0–0.2)
BASO%: 0.5 % (ref 0.0–2.0)
EOS%: 1.1 % (ref 0.0–7.0)
Eosinophils Absolute: 0.1 10*3/uL (ref 0.0–0.5)
HEMATOCRIT: 40 % (ref 34.8–46.6)
HGB: 13.2 g/dL (ref 11.6–15.9)
LYMPH#: 1.9 10*3/uL (ref 0.9–3.3)
LYMPH%: 28.3 % (ref 14.0–48.0)
MCH: 30.8 pg (ref 26.0–34.0)
MCHC: 33 g/dL (ref 32.0–36.0)
MCV: 94 fL (ref 81–101)
MONO#: 0.5 10*3/uL (ref 0.1–0.9)
MONO%: 7.2 % (ref 0.0–13.0)
NEUT%: 62.9 % (ref 39.6–80.0)
NEUTROS ABS: 4.1 10*3/uL (ref 1.5–6.5)
Platelets: 168 10*3/uL (ref 145–400)
RBC: 4.28 10*6/uL (ref 3.70–5.32)
RDW: 12.3 % (ref 11.1–15.7)
WBC: 6.5 10*3/uL (ref 3.9–10.0)

## 2014-11-30 LAB — CMP (CANCER CENTER ONLY)
ALT(SGPT): 15 U/L (ref 10–47)
AST: 20 U/L (ref 11–38)
Albumin: 3.8 g/dL (ref 3.3–5.5)
Alkaline Phosphatase: 45 U/L (ref 26–84)
BILIRUBIN TOTAL: 1 mg/dL (ref 0.20–1.60)
BUN, Bld: 21 mg/dL (ref 7–22)
CO2: 28 mEq/L (ref 18–33)
Calcium: 9.4 mg/dL (ref 8.0–10.3)
Chloride: 102 mEq/L (ref 98–108)
Creat: 1 mg/dl (ref 0.6–1.2)
GLUCOSE: 93 mg/dL (ref 73–118)
Potassium: 3.8 mEq/L (ref 3.3–4.7)
SODIUM: 142 meq/L (ref 128–145)
TOTAL PROTEIN: 6.8 g/dL (ref 6.4–8.1)

## 2014-11-30 MED ORDER — AZITHROMYCIN 250 MG PO TABS: ORAL_TABLET | ORAL | Status: DC

## 2014-11-30 NOTE — Progress Notes (Signed)
Hematology and Oncology Follow Up Visit  Jennifer Yang [id number] 05/02/1941 74 y.o. 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   ER(+)  Current Therapy:    observation     Interim History:  Ms.  Yang is in for her follow-up. She is doing well. She was seen by dermatology because a suspicious lesion on her left lower back. This was removed and acid found to be precancerous. Patient sees a dermatologist for other skin lesions. She is grateful that we are able to see her and pick out that lesion before turn into cancer.  She is due for a mammogram in March.  She and her husband had a good holiday season. They really did not go anywhere. They don't have anything planned for this year so far.  There's been no cough. She's had no shortness of breath. She's had no nausea or vomiting. There's been no change in bowel or bladder habits.  Her performance status is ECOG 0    Medications:  Current outpatient prescriptions:  .  ALPRAZolam (XANAX) 0.5 MG tablet, Take 0.5 mg by mouth as needed., Disp: , Rfl:  .  aspirin 81 MG EC tablet, Take 81 mg by mouth daily.  , Disp: , Rfl:  .  azithromycin (ZITHROMAX Z-PAK) 250 MG tablet, As directed, Disp: 6 each, Rfl: 0 .  Calcium-Vitamin D (CALTRATE 600 PLUS-VIT D PO), Take 1 tablet by mouth daily.  , Disp: , Rfl:  .  Cholecalciferol (VITAMIN D) 1000 UNITS capsule, Take 2,000 Units by mouth daily. , Disp: , Rfl:  .  furosemide (LASIX) 20 MG tablet, daily. As needed, Disp: , Rfl:  .  meclizine (ANTIVERT) 25 MG tablet, Take 25 mg by mouth 3 (three) times daily as needed., Disp: , Rfl:  .  ondansetron (ZOFRAN ODT) 4 MG disintegrating tablet, Take 1 tablet (4 mg total) by mouth every 8 (eight) hours as needed for nausea., Disp: 20 tablet, Rfl: 0  Allergies:  Allergies  Allergen Reactions  . Codeine Sulfate Swelling  . Fish-Derived  Products Other (See Comments)    Cellulitis   . Rocephin [Ceftriaxone Sodium] Other (See Comments)    Low blood count  . Sulfa Antibiotics Other (See Comments)    Mouth and lip sores    Past Medical History, Surgical history, Social history, and Family History were reviewed and updated.  Review of Systems: As above  Physical Exam:  weight is 172 lb (78.019 kg). Her oral temperature is 97.9 F (36.6 C). Her blood pressure is 174/65 and her pulse is 52. Her respiration is 16.   Well-developed and well-nourished white female. Her head and neck exam is nausea or oral lesion. She is no palpable cervical or supraclavicular lymph nodes. Lungs are clear a lot. Cardiac exam regular rate and rhythm with a normal S1 and S2. Breast exam shows left breast with no masses edema or erythema. There is no left axillary adenopathy. Right chest wall shows a well-healed mastectomy.there is no nodule. There is no erythema. There is no right axillary adenopathy. Abdomen soft. Has good bowel sounds. There is no fluid wave. No palpable liver or spleen tip. Back exam shows no tenderness over the spine ribs or hips. There is no kyphosis. Extremities shows no clubbing cyanosis or edema. Maybe some slight lymphedema of the right arm. She's good range of motion of  the joints. Skin exam shows a suspicious hyperpigmented lesion on the back. This is in the mid back. This is just to the right of the spine. Neurological exam is nonfocal.  Lab Results  Component Value Date   WBC 6.5 11/30/2014   HGB 13.2 11/30/2014   HCT 40.0 11/30/2014   MCV 94 11/30/2014   PLT 168 11/30/2014     Chemistry      Component Value Date/Time   NA 142 11/30/2014 0939   NA 144 05/24/2013 0959   NA 142 07/16/2012 1200   K 3.8 11/30/2014 0939   K 3.9 05/24/2013 0959   K 4.5 07/16/2012 1200   CL 102 11/30/2014 0939   CL 104 07/16/2012 1200   CO2 28 11/30/2014 0939   CO2 27 05/24/2013 0959   CO2 28 07/16/2012 1200   BUN 21 11/30/2014  0939   BUN 19.7 05/24/2013 0959   BUN 25* 07/16/2012 1200   CREATININE 1.0 11/30/2014 0939   CREATININE 1.1 05/24/2013 0959   CREATININE 1.33* 07/16/2012 1200      Component Value Date/Time   CALCIUM 9.4 11/30/2014 0939   CALCIUM 9.8 05/24/2013 0959   CALCIUM 10.0 07/16/2012 1200   ALKPHOS 45 11/30/2014 0939   ALKPHOS 38* 05/27/2012 0907   AST 20 11/30/2014 0939   AST 15 05/27/2012 0907   ALT 15 11/30/2014 0939   ALT 14 05/27/2012 0907   BILITOT 1.00 11/30/2014 0939   BILITOT 0.6 05/27/2012 0907         Impression and Plan: Jennifer Yang is 74 year old white female with multiple right breast cancers. She's had 2 separate primaries. They're both stage I. They're both  ER positive. She underwent  a mastectomy.  There is a looks fantastic. She is due for a mammogram in March. Otherwise we will see her back in 6 months.  [name], MD 1/14/201611:33 AM

## 2014-12-01 ENCOUNTER — Encounter: Payer: Self-pay | Admitting: Nurse Practitioner

## 2014-12-01 LAB — VITAMIN D 25 HYDROXY (VIT D DEFICIENCY, FRACTURES): VIT D 25 HYDROXY: 48 ng/mL (ref 30–100)

## 2014-12-04 ENCOUNTER — Telehealth: Payer: Self-pay | Admitting: *Deleted

## 2014-12-04 DIAGNOSIS — C50912 Malignant neoplasm of unspecified site of left female breast: Secondary | ICD-10-CM

## 2014-12-04 DIAGNOSIS — M818 Other osteoporosis without current pathological fracture: Secondary | ICD-10-CM

## 2014-12-04 DIAGNOSIS — I890 Lymphedema, not elsewhere classified: Secondary | ICD-10-CM

## 2014-12-04 DIAGNOSIS — T50905A Adverse effect of unspecified drugs, medicaments and biological substances, initial encounter: Secondary | ICD-10-CM

## 2014-12-04 NOTE — Telephone Encounter (Signed)
Called for z-pack refills. Informed that medication was sent into the [facility] last Thursday with three refills.

## 2014-12-05 ENCOUNTER — Telehealth: Payer: Self-pay | Admitting: *Deleted

## 2014-12-05 NOTE — Telephone Encounter (Signed)
-----   Message from [name], MD sent at 12/01/2014  4:03 PM EST ----- Call and tell her that the vitamin D is okay. Jennifer Yang

## 2015-01-15 ENCOUNTER — Encounter: Payer: Self-pay | Admitting: Gastroenterology

## 2015-01-18 ENCOUNTER — Encounter: Payer: Self-pay | Admitting: Hematology & Oncology

## 2015-01-18 ENCOUNTER — Encounter: Payer: Self-pay | Admitting: Gastroenterology

## 2015-03-07 ENCOUNTER — Telehealth: Payer: Self-pay | Admitting: Gastroenterology

## 2015-03-07 ENCOUNTER — Ambulatory Visit (AMBULATORY_SURGERY_CENTER): Payer: Self-pay | Admitting: *Deleted

## 2015-03-07 VITALS — Ht 69.0 in | Wt 171.0 lb

## 2015-03-07 DIAGNOSIS — Z1211 Encounter for screening for malignant neoplasm of colon: Secondary | ICD-10-CM

## 2015-03-07 MED ORDER — NA SULFATE-K SULFATE-MG SULF 17.5-3.13-1.6 GM/177ML PO SOLN: ORAL | Status: DC

## 2015-03-07 NOTE — Progress Notes (Signed)
Patient denies any allergies to eggs or soy. Patient has had post-op nausea and vomiting in the past. Patient gets "panic attacks" before surgeries. Patient denies any oxygen use at home and does not take any diet/weight loss medications. EMMI education assisgned to patient on colonoscopy, this was explained and instructions given to patient.

## 2015-03-07 NOTE — Telephone Encounter (Signed)
Spoke with with patient and informed her that we can leave a free sample of Suprep of front for patient to pick up. Pt states she will come by today.

## 2015-03-07 NOTE — Telephone Encounter (Signed)
Spoke with Sharee Pimple at [facility] and informed her that patient is part Medicare and cannot use a coupon. She states the prep is 90 dollars. Told her I will contact the patient to give her a free sample of Suprep. Left a message for patient to return my call.

## 2015-03-18 DIAGNOSIS — N3090 Cystitis, unspecified without hematuria: Secondary | ICD-10-CM

## 2015-03-18 HISTORY — DX: Cystitis, unspecified without hematuria: N30.90

## 2015-03-21 ENCOUNTER — Observation Stay (HOSPITAL_COMMUNITY): Payer: Medicare Other

## 2015-03-21 ENCOUNTER — Encounter: Payer: Self-pay | Admitting: Gastroenterology

## 2015-03-21 ENCOUNTER — Observation Stay (HOSPITAL_COMMUNITY)
Admission: EM | Admit: 2015-03-21 | Discharge: 2015-03-22 | Disposition: A | Discharge status: Home / Self Care | Payer: Medicare Other | Attending: Internal Medicine | Admitting: Internal Medicine

## 2015-03-21 ENCOUNTER — Ambulatory Visit (AMBULATORY_SURGERY_CENTER): Payer: Medicare Other | Admitting: Gastroenterology

## 2015-03-21 ENCOUNTER — Encounter (HOSPITAL_COMMUNITY): Payer: Self-pay | Admitting: Emergency Medicine

## 2015-03-21 ENCOUNTER — Emergency Department (HOSPITAL_COMMUNITY): Payer: Medicare Other

## 2015-03-21 VITALS — BP 164/70 | HR 62 | Temp 97.2°F | Resp 14 | Ht 69.0 in | Wt 171.0 lb

## 2015-03-21 DIAGNOSIS — E785 Hyperlipidemia, unspecified: Secondary | ICD-10-CM | POA: Diagnosis present

## 2015-03-21 DIAGNOSIS — I951 Orthostatic hypotension: Secondary | ICD-10-CM | POA: Diagnosis not present

## 2015-03-21 DIAGNOSIS — R55 Syncope and collapse: Secondary | ICD-10-CM | POA: Diagnosis not present

## 2015-03-21 DIAGNOSIS — R079 Chest pain, unspecified: Secondary | ICD-10-CM

## 2015-03-21 DIAGNOSIS — D123 Benign neoplasm of transverse colon: Secondary | ICD-10-CM

## 2015-03-21 DIAGNOSIS — D649 Anemia, unspecified: Secondary | ICD-10-CM | POA: Insufficient documentation

## 2015-03-21 DIAGNOSIS — I10 Essential (primary) hypertension: Secondary | ICD-10-CM | POA: Diagnosis not present

## 2015-03-21 DIAGNOSIS — R911 Solitary pulmonary nodule: Secondary | ICD-10-CM | POA: Diagnosis not present

## 2015-03-21 DIAGNOSIS — Z7982 Long term (current) use of aspirin: Secondary | ICD-10-CM | POA: Diagnosis not present

## 2015-03-21 DIAGNOSIS — Z803 Family history of malignant neoplasm of breast: Secondary | ICD-10-CM | POA: Diagnosis not present

## 2015-03-21 DIAGNOSIS — F419 Anxiety disorder, unspecified: Secondary | ICD-10-CM | POA: Insufficient documentation

## 2015-03-21 DIAGNOSIS — Z1211 Encounter for screening for malignant neoplasm of colon: Secondary | ICD-10-CM

## 2015-03-21 DIAGNOSIS — D122 Benign neoplasm of ascending colon: Secondary | ICD-10-CM

## 2015-03-21 DIAGNOSIS — R140 Abdominal distension (gaseous): Secondary | ICD-10-CM | POA: Diagnosis not present

## 2015-03-21 DIAGNOSIS — Z853 Personal history of malignant neoplasm of breast: Secondary | ICD-10-CM | POA: Diagnosis not present

## 2015-03-21 LAB — CBC WITH DIFFERENTIAL/PLATELET
BASOS ABS: 0 10*3/uL (ref 0.0–0.1)
Basophils Relative: 0 % (ref 0–1)
Eosinophils Absolute: 0 10*3/uL (ref 0.0–0.7)
Eosinophils Relative: 0 % (ref 0–5)
HEMATOCRIT: 34.9 % — AB (ref 36.0–46.0)
Hemoglobin: 11.1 g/dL — ABNORMAL LOW (ref 12.0–15.0)
Lymphocytes Relative: 17 % (ref 12–46)
Lymphs Abs: 1.2 10*3/uL (ref 0.7–4.0)
MCH: 29.8 pg (ref 26.0–34.0)
MCHC: 31.8 g/dL (ref 30.0–36.0)
MCV: 93.8 fL (ref 78.0–100.0)
Monocytes Absolute: 0.4 10*3/uL (ref 0.1–1.0)
Monocytes Relative: 5 % (ref 3–12)
NEUTROS ABS: 5.7 10*3/uL (ref 1.7–7.7)
NEUTROS PCT: 78 % — AB (ref 43–77)
Platelets: 162 10*3/uL (ref 150–400)
RBC: 3.72 MIL/uL — ABNORMAL LOW (ref 3.87–5.11)
RDW: 12.9 % (ref 11.5–15.5)
WBC: 7.3 10*3/uL (ref 4.0–10.5)

## 2015-03-21 LAB — BASIC METABOLIC PANEL
Anion gap: 9 (ref 5–15)
BUN: 13 mg/dL (ref 6–20)
CALCIUM: 8.7 mg/dL — AB (ref 8.9–10.3)
CO2: 25 mmol/L (ref 22–32)
Chloride: 108 mmol/L (ref 101–111)
Creatinine, Ser: 1.03 mg/dL — ABNORMAL HIGH (ref 0.44–1.00)
GFR calc Af Amer: >60 mL/min (ref >60)
GFR, EST NON AFRICAN AMERICAN: 53 mL/min — AB (ref >60)
GLUCOSE: 169 mg/dL — AB (ref 70–99)
Potassium: 3.5 mmol/L (ref 3.5–5.1)
Sodium: 142 mmol/L (ref 135–145)

## 2015-03-21 LAB — I-STAT CHEM 8, ED
BUN: 12 mg/dL (ref 6–20)
CHLORIDE: 106 mmol/L (ref 101–111)
CREATININE: 0.9 mg/dL (ref 0.44–1.00)
Calcium, Ion: 1.14 mmol/L (ref 1.13–1.30)
Glucose, Bld: 164 mg/dL — ABNORMAL HIGH (ref 70–99)
HCT: 35 % — ABNORMAL LOW (ref 36.0–46.0)
Hemoglobin: 11.9 g/dL — ABNORMAL LOW (ref 12.0–15.0)
Potassium: 3.5 mmol/L (ref 3.5–5.1)
SODIUM: 141 mmol/L (ref 135–145)
TCO2: 19 mmol/L (ref 0–100)

## 2015-03-21 LAB — I-STAT TROPONIN, ED
TROPONIN I, POC: 0.01 ng/mL (ref 0.00–0.08)
TROPONIN I, POC: 0.01 ng/mL (ref 0.00–0.08)

## 2015-03-21 LAB — CBG MONITORING, ED: Glucose-Capillary: 121 mg/dL — ABNORMAL HIGH (ref 70–99)

## 2015-03-21 MED ORDER — ONDANSETRON HCL 4 MG/2ML IJ SOLN: 4.0000 mg | Freq: Four times a day (QID) | INTRAMUSCULAR | Status: DC | PRN

## 2015-03-21 MED ORDER — SODIUM CHLORIDE 0.9 % IV SOLN
INTRAVENOUS | Status: DC
  Administered 2015-03-21 – 2015-03-22 (×2): via INTRAVENOUS

## 2015-03-21 MED ORDER — DIPHENHYDRAMINE HCL 50 MG/ML IJ SOLN: 50.0000 mg | Freq: Once | INTRAMUSCULAR | Status: DC

## 2015-03-21 MED ORDER — ENOXAPARIN SODIUM 40 MG/0.4ML ~~LOC~~ SOLN
40.0000 mg | SUBCUTANEOUS | Status: DC
  Administered 2015-03-21: 40 mg via SUBCUTANEOUS
  Filled 2015-03-21: qty 0.4

## 2015-03-21 MED ORDER — ALPRAZOLAM 0.5 MG PO TABS: 0.5000 mg | ORAL_TABLET | ORAL | Status: DC | PRN

## 2015-03-21 MED ORDER — ACETAMINOPHEN 650 MG RE SUPP: 650.0000 mg | Freq: Four times a day (QID) | RECTAL | Status: DC | PRN

## 2015-03-21 MED ORDER — ASPIRIN EC 81 MG PO TBEC
81.0000 mg | DELAYED_RELEASE_TABLET | Freq: Every day | ORAL | Status: DC
  Administered 2015-03-21: 81 mg via ORAL
  Filled 2015-03-21: qty 1

## 2015-03-21 MED ORDER — IOHEXOL 350 MG/ML SOLN
100.0000 mL | Freq: Once | INTRAVENOUS | Status: AC | PRN
  Administered 2015-03-21: 100 mL via INTRAVENOUS

## 2015-03-21 MED ORDER — SIMVASTATIN 10 MG PO TABS
5.0000 mg | ORAL_TABLET | Freq: Every day | ORAL | Status: DC
  Administered 2015-03-21: 5 mg via ORAL
  Filled 2015-03-21: qty 1

## 2015-03-21 MED ORDER — ASPIRIN 81 MG PO TBEC: 81.0000 mg | DELAYED_RELEASE_TABLET | Freq: Every day | ORAL | Status: DC

## 2015-03-21 MED ORDER — VITAMIN D 1000 UNITS PO CAPS: 2000.0000 [IU] | ORAL_CAPSULE | Freq: Every day | ORAL | Status: DC

## 2015-03-21 MED ORDER — SODIUM CHLORIDE 0.9 % IV BOLUS (SEPSIS)
1000.0000 mL | Freq: Once | INTRAVENOUS | Status: AC
  Administered 2015-03-21: 1000 mL via INTRAVENOUS

## 2015-03-21 MED ORDER — METOCLOPRAMIDE HCL 5 MG/ML IJ SOLN: 10.0000 mg | Freq: Once | INTRAMUSCULAR | Status: DC

## 2015-03-21 MED ORDER — SODIUM CHLORIDE 0.9 % IV SOLN
INTRAVENOUS | Status: DC
  Administered 2015-03-21: 17:00:00 via INTRAVENOUS

## 2015-03-21 MED ORDER — ACETAMINOPHEN 325 MG PO TABS
650.0000 mg | ORAL_TABLET | Freq: Four times a day (QID) | ORAL | Status: DC | PRN
  Administered 2015-03-22: 650 mg via ORAL
  Filled 2015-03-21: qty 2

## 2015-03-21 MED ORDER — KETOROLAC TROMETHAMINE 30 MG/ML IJ SOLN
30.0000 mg | Freq: Once | INTRAMUSCULAR | Status: DC
Start: 2015-03-21 — End: 2015-03-21

## 2015-03-21 MED ORDER — FENTANYL CITRATE (PF) 100 MCG/2ML IJ SOLN
50.0000 ug | Freq: Once | INTRAMUSCULAR | Status: AC
  Administered 2015-03-21: 50 ug via INTRAVENOUS
  Filled 2015-03-21: qty 2

## 2015-03-21 MED ORDER — VITAMIN D 1000 UNITS PO TABS
2000.0000 [IU] | ORAL_TABLET | Freq: Every day | ORAL | Status: DC
  Administered 2015-03-21: 2000 [IU] via ORAL
  Filled 2015-03-21: qty 2

## 2015-03-21 MED ORDER — ALUM & MAG HYDROXIDE-SIMETH 200-200-20 MG/5ML PO SUSP
15.0000 mL | Freq: Four times a day (QID) | ORAL | Status: DC | PRN
  Administered 2015-03-21: 15 mL via ORAL
  Filled 2015-03-21: qty 30

## 2015-03-21 MED ORDER — ONDANSETRON HCL 4 MG PO TABS: 4.0000 mg | ORAL_TABLET | Freq: Four times a day (QID) | ORAL | Status: DC | PRN

## 2015-03-21 MED ORDER — SODIUM CHLORIDE 0.9 % IV SOLN: 500.0000 mL | INTRAVENOUS | Status: DC

## 2015-03-21 MED ORDER — SODIUM CHLORIDE 0.9 % IJ SOLN: 3.0000 mL | Freq: Two times a day (BID) | INTRAMUSCULAR | Status: DC

## 2015-03-21 NOTE — Progress Notes (Signed)
Called to room to assist during endoscopic procedure.  Patient ID and intended procedure confirmed with present staff. Received instructions for my participation in the procedure from the performing physician.  

## 2015-03-21 NOTE — ED Provider Notes (Signed)
TIME SEEN: 12:50 PM  CHIEF COMPLAINT: Near syncope  HPI: Pt is a 74 y.o. with history of hypertension, hyperlipidemia, prior history of breast cancer who had a near syncopal event today. Reports that she had a colonoscopy this morning with Dr. Fuller Plan. States when she woke up from sedation around 10:30 AM she was feeling "gas pain" in her upper abdomen going into the left side of her chest. No shortness of breath. This pain has been constant since that time and she feels like it would get better if she could burp. Denies any abdominal pain. No vomiting. States she did have large amounts of stool passed recently because of her bowel prep but no bloody stool or melena. Denies a history of PE or DVT. No history of CAD. No history of tobacco use. States she was out to eat with her husband and they stood up from the table to go pay when she felt very lightheaded like she may pass out. She did not completely syncopized. No headache. No numbness, tingling or focal weakness. No other preceding symptoms other than lightheadedness.  ROS: See HPI Constitutional: no fever  Eyes: no drainage  ENT: no runny nose   Cardiovascular:   chest pain  Resp: no SOB  GI: no vomiting GU: no dysuria Integumentary: no rash  Allergy: no hives  Musculoskeletal: no leg swelling  Neurological: no slurred speech ROS otherwise negative  PAST MEDICAL HISTORY/PAST SURGICAL HISTORY:  Past Medical History  Diagnosis Date  . MVP (mitral valve prolapse)   . Allergy     shell fish  . Hyperlipidemia   . Hypertension   . Arthritis     knees  . Cancer     right Chemo/radiation '01/radical '09  . Thyroid disease   . Family history of breast cancer     mother  . Breast cancer, stage 1 07/16/1990  . Breast cancer, right breast 02/16/2008  . Lymphedema of arm 11/24/2011    right arm  . Benign essential HTN 11/24/2011  . 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 PONV (postoperative nausea and vomiting)     has vertigo also-would like  scop patch  . Anxiety     takes xanax on a rare occasion  . Vertigo   . Contact lens/glasses fitting   . Bladder infection 03/18/15    completed antibotics     MEDICATIONS:  Prior to Admission medications   Medication Sig Start Date End Date Taking? Authorizing Provider  ALPRAZolam Duanne Moron) 0.5 MG tablet Take 0.5 mg by mouth as needed.    Historical Provider, MD  aspirin 81 MG EC tablet Take 81 mg by mouth daily.      Historical Provider, MD  azithromycin (ZITHROMAX Z-PAK) 250 MG tablet As directed Patient not taking: Reported on 03/07/2015 11/30/14   [name], MD  Calcium-Vitamin D (CALTRATE 600 PLUS-VIT D PO) Take 1 tablet by mouth daily.      Historical Provider, MD  Cholecalciferol (VITAMIN D) 1000 UNITS capsule Take 2,000 Units by mouth daily.     Historical Provider, MD  losartan (COZAAR) 25 MG tablet Take 25 mg by mouth daily.    Historical Provider, MD  meclizine (ANTIVERT) 25 MG tablet Take 25 mg by mouth 3 (three) times daily as needed.    Historical Provider, MD  ondansetron (ZOFRAN ODT) 4 MG disintegrating tablet Take 1 tablet (4 mg total) by mouth every 8 (eight) hours as needed for nausea. 08/28/12   Leonard Schwartz, MD  simvastatin (ZOCOR) 5  MG tablet Take 5 mg by mouth daily.    Historical Provider, MD    ALLERGIES:  Allergies  Allergen Reactions  . Codeine Sulfate Swelling  . Fish-Derived Products Other (See Comments)    Cellulitis   . Rocephin [Ceftriaxone Sodium] Other (See Comments)    Low blood count  . Sulfa Antibiotics Other (See Comments)    Mouth and lip sores    SOCIAL HISTORY:  History  Substance Use Topics  . Smoking status: Never Smoker   . Smokeless tobacco: Never Used  . Alcohol Use: No    FAMILY HISTORY: Family History  Problem Relation Age of Onset  . Cancer Mother   . Heart disease Mother   . Heart disease Father   . Heart disease Brother   . Colon cancer Neg Hx     EXAM: BP 139/61 mmHg  Pulse 105  Temp(Src) 98.1 F (36.7 C)  (Oral)  Resp 20  SpO2 100% CONSTITUTIONAL: Alert and oriented and responds appropriately to questions. Well-appearing; well-nourished HEAD: Normocephalic EYES: Conjunctivae clear, PERRL ENT: normal nose; no rhinorrhea; dry mucous membranes; pharynx without lesions noted NECK: Supple, no meningismus, no LAD  CARD: Reglan tachycardic; S1 and S2 appreciated; no murmurs, no clicks, no rubs, no gallops RESP: Normal chest excursion without splinting or tachypnea; breath sounds clear and equal bilaterally; no wheezes, no rhonchi, no rales, no hypoxia or respiratory distress, speaking full sentences ABD/GI: Normal bowel sounds; non-distended; soft, non-tender, no rebound, no guarding, no tympany or fluid wave, no peritoneal signs BACK:  The back appears normal and is non-tender to palpation, there is no CVA tenderness EXT: Normal ROM in all joints; non-tender to palpation; no edema; normal capillary refill; no cyanosis, no calf tenderness or swelling    SKIN: Normal color for age and race; warm NEURO: Moves all extremities equally sensation to light touch intact diffusely, cranial nerves II through XII intact PSYCH: The patient's mood and manner are appropriate. Grooming and personal hygiene are appropriate.  MEDICAL DECISION MAKING: Patient here with near syncopal event. She is extremely orthostatic. We'll give IV fluids. Does have risk factors for CAD and has complaining of gas pain that radiates into her chest. EKG shows no ischemic changes or arrhythmia. We'll obtain cardiac labs, abdominal series. We'll give fentanyl for her pain.  ED PROGRESS: Patient's labs unremarkable including negative troponin. Acute abdominal series unremarkable. Patient reports feeling better after 1 L of IV fluids. We'll give second liter of IV fluids and repeat second troponin. If vital signs improving second troponin negative at discharge home.  4:15 PM  Pt now describing the pain in her chest as a pleuritic sharp pain.  No history of PE or DVT. No lower extreme swelling or pain but does have a history of previous cancer and had a near syncopal event today. Heart rate jumped from the 110s to 150s with lying flat to standing up. Blood pressure however has improved. I feel she will need admission for IV hydration will also obtain a CT angiogram of her chest to evaluate for pulmonary embolus. We'll discuss with hospitalist for admission.  PCP is Dr. Trilby Drummer.  4:35 PM  D/w Dr. Clementeen Graham with hospitalist service for admission to telemetry, observation. He is aware the CT scan is pending.      EKG Interpretation  Date/Time:  Wednesday Mar 21 2015 12:26:34 EDT Ventricular Rate:  97 PR Interval:  173 QRS Duration: 100 QT Interval:  350 QTC Calculation: 445 R Axis:   -23  Text Interpretation:  Sinus arrhythmia Borderline left axis deviation Probable anteroseptal infarct, old Confirmed by Iyari Hagner,  DO, Claxton Levitz ([id number]) on 03/21/2015 12:59:14 PM        [place], DO 03/21/15 [id number]

## 2015-03-21 NOTE — Op Note (Addendum)
[place]  [address] [place], [address]   COLONOSCOPY PROCEDURE REPORT PATIENT: Jennifer Yang, Jennifer Yang  MR#: [id number] BIRTHDATE: November 04, 1941 , 74  yrs. old GENDER: female ENDOSCOPIST: Ladene Artist, MD, [facility] REFERRED GG:YIRSW Noah Delaine, M.D. PROCEDURE DATE:  03/21/2015 PROCEDURE:   Colonoscopy, screening and Colonoscopy with snare polypectomy First Screening Colonoscopy - Avg.  risk and is 50 yrs.  old or older Yes.  Prior Negative Screening - Now for repeat screening. 10 or more years since last screening  History of Adenoma - Now for follow-up colonoscopy & has been > or = to 3 yrs.  N/A  Polyps Removed Today ASA CLASS:   Class II INDICATIONS:Screening for colonic neoplasia and Colorectal Neoplasm Risk Assessment for this procedure is average risk. MEDICATIONS: Monitored anesthesia care, Propofol 200 mg IV, and lidocaine 40 mg IV DESCRIPTION OF PROCEDURE:   After the risks benefits and alternatives of the procedure were thoroughly explained, informed consent was obtained.  The digital rectal exam revealed no abnormalities of the rectum.   The [id number]  endoscope was introduced through the anus and advanced to the cecum, which was identified by both the appendix and ileocecal valve. No adverse events experienced.   The quality of the prep was excellent. (MoviPrep was used)  The instrument was then slowly withdrawn as the colon was fully examined.  COLON FINDINGS: Two sessile polyps measuring 5 mm in size were found in the ascending colon.  Polypectomies were performed with a cold snare.  The resection was complete, the polyp tissue was completely retrieved and sent to histology.   A sessile polyp measuring 12 mm in size was found at the hepatic flexure.  A polypectomy was performed using snare cautery.  The resection was complete, the polyp tissue was completely retrieved and sent to histology. There was moderate diverticulosis noted in the sigmoid  colon with associated muscular hypertrophy.   The examination was otherwise normal.  Retroflexion was not performed due to a narrow rectal vault and views revealed internal Grade I hemorrhoids. The time to cecum = 3.5 Withdrawal time = 12.5   The scope was withdrawn and the procedure completed. COMPLICATIONS: There were no immediate complications. ENDOSCOPIC IMPRESSION: 1.   Two sessile polyps in the ascending colon; polypectomies performed with a cold snare 2.   Sessile polyp at the hepatic flexure; polypectomy performed using snare cautery 3.   Moderate diverticulosis in the sigmoid colon 4.   Grade l internal hemorrhoids  RECOMMENDATIONS: 1.  Hold Aspirin and all other NSAIDS for 2 weeks. 2.  Await pathology results 3.  Repeat colonoscopy in 3 years if large polyp is adenomatous; 5 years if other polyps adenomatous; otherwise no plans for future screening colonoscopies. These type of procedures usually stop at age 25.  eSigned:  Ladene Artist, MD, [facility] 03/21/2015 9:44 AM Revised: 03/21/2015 9:44 AM

## 2015-03-21 NOTE — Progress Notes (Signed)
When placing pt in wheelchair she starting complaining of discomfort and needing to burp.  Assisted to restroom where she preceded to pass more air.  Celia McCoy assisted pt to walk around recovery room where she preceded to pass more air and burp.  Dr. Fuller Plan in to see pt and gave orders to let her go home and call if she gets worse.  He states he feels certain it is trapped air and she is able to pass flatus.

## 2015-03-21 NOTE — ED Notes (Signed)
Bed: WA01 Expected date:  Expected time:  Means of arrival:  Comments: EMS 

## 2015-03-21 NOTE — Patient Instructions (Signed)
See Procedure Report for Findings and recommendations  YOU HAD AN ENDOSCOPIC PROCEDURE TODAY AT [facility]:   Refer to the procedure report that was given to you for any specific questions about what was found during the examination.  If the procedure report does not answer your questions, please call your gastroenterologist to clarify.  If you requested that your care partner not be given the details of your procedure findings, then the procedure report has been included in a sealed envelope for you to review at your convenience later.  YOU SHOULD EXPECT: Some feelings of bloating in the abdomen. Passage of more gas than usual.  Walking can help get rid of the air that was put into your GI tract during the procedure and reduce the bloating. If you had a lower endoscopy (such as a colonoscopy or flexible sigmoidoscopy) you may notice spotting of blood in your stool or on the toilet paper. If you underwent a bowel prep for your procedure, you may not have a normal bowel movement for a few days.  Please Note:  You might notice some irritation and congestion in your nose or some drainage.  This is from the oxygen used during your procedure.  There is no need for concern and it should clear up in a day or so.  SYMPTOMS TO REPORT IMMEDIATELY:   Following lower endoscopy (colonoscopy or flexible sigmoidoscopy):  Excessive amounts of blood in the stool  Significant tenderness or worsening of abdominal pains  Swelling of the abdomen that is new, acute  Fever of 100F or higher   Following upper endoscopy (EGD)  Vomiting of blood or coffee ground material  New chest pain or pain under the shoulder blades  Painful or persistently difficult swallowing  New shortness of breath  Fever of 100F or higher  Black, tarry-looking stools  For urgent or emergent issues, a gastroenterologist can be reached at any hour by calling [phone].   DIET: Your first meal following the  procedure should be a small meal and then it is ok to progress to your normal diet. Heavy or fried foods are harder to digest and may make you feel nauseous or bloated.  Likewise, meals heavy in dairy and vegetables can increase bloating.  Drink plenty of fluids but you should avoid alcoholic beverages for 24 hours.  ACTIVITY:  You should plan to take it easy for the rest of today and you should NOT DRIVE or use heavy machinery until tomorrow (because of the sedation medicines used during the test).    FOLLOW UP: Our staff will call the number listed on your records the next business day following your procedure to check on you and address any questions or concerns that you may have regarding the information given to you following your procedure. If we do not reach you, we will leave a message.  However, if you are feeling well and you are not experiencing any problems, there is no need to return our call.  We will assume that you have returned to your regular daily activities without incident.  If any biopsies were taken you will be contacted by phone or by letter within the next 1-3 weeks.  Please call us at [phone] if you have not heard about the biopsies in 3 weeks.    SIGNATURES/CONFIDENTIALITY: You and/or your care partner have signed paperwork which will be entered into your electronic medical record.  These signatures attest to the fact that that the information above  on your After Visit Summary has been reviewed and is understood.  Full responsibility of the confidentiality of this discharge information lies with you and/or your care-partner.  Please follow all discharge instructions given to you by the recovery room nurse. If you have any questions or problems after discharge please call one of the numbers listed above. You will receive a phone call in the am to see how you are doing and answer any questions you may have. Thank you for [facility] for [facility]  care needs. 

## 2015-03-21 NOTE — H&P (Addendum)
[facility] History and Physical  Jennifer Yang [id number]:[id number] DOB: Jul 30, 1941 DOA: 03/21/2015  Referring physician: Dr Leonides Schanz PCP: Thressa Sheller, MD   Chief Complaint:  Near syncope  HPI:  74 year old female with a history of hypertension, dyslipidemia, history of breast cancer, vertigo who had a routine colonoscopy done today. Patient was at a restaurant after that ordering some food when she had gaseous distention of her abdomen and when she got up to pay for the food she feels like she was going to pass out and slumped on the floor. She denied losing consciousness, diaphoresis, chest pain or palpitations. Denies any shortness of breath. Denies any weakness of her arms or legs, tingling or numbness. Patient reports having 2-3 episodes of similar near syncopal episodes in the past with past. She denies any change in her medications or recent illness. Denies any recent travel. She reports her blood pressure to be fairly stable at home. Denies any bowel or urinary symptoms.   Course in the ED Patient was significantly orthostatic with blood pressure dropping from 119/68 (lying) 289/41 upon standing with heart rate elevated from 101 -to 120. Initial blood work was unremarkable except for mild anemia. X-ray of the abdomen with chest showed nonspecific bowel gas pattern. EKG showed sinus arrhythmia. Initial troponin was negative. Given upper abdominal discomfort with near syncope CT angiogram of the chest ordered by ED physician to rule out PE. Hospitalists admission requested to telemetry.   Review of Systems:  As outlined in history of present illness. 12 point review of systems otherwise unremarkable.  Past Medical History  Diagnosis Date  . MVP (mitral valve prolapse)   . Allergy     shell fish  . Hyperlipidemia   . Hypertension   . Arthritis     knees  . Cancer     right Chemo/radiation '01/radical '09  . Thyroid disease   . Family history of breast cancer     mother  .  Breast cancer, stage 1 07/16/1990  . Breast cancer, right breast 02/16/2008  . Lymphedema of arm 11/24/2011    right arm  . Benign essential HTN 11/24/2011  . 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 PONV (postoperative nausea and vomiting)     has vertigo also-would like scop patch  . Anxiety     takes xanax on a rare occasion  . Vertigo   . Contact lens/glasses fitting   . Bladder infection 03/18/15    completed antibotics    Past Surgical History  Procedure Laterality Date  . Thyroidectomy, partial    . Breast lumpectomy      2001 right  . Mastectomy, radical      2009 right  . Knee arthroscopy      left  . Foot neuroma surgery      left  . Upper gastrointestinal endoscopy    . Foot amputation      right hammer toe  . Mass excision  07/21/2012    Procedure: EXCISION MASS;  Surgeon: Adin Hector, MD;  Location: [facility];  Service: General;  Laterality: N/A;  excision 15cm posterior neck mass posterior  . Colonoscopy  2006    w/Dr.Orr   Social History:  reports that she has never smoked. She has never used smokeless tobacco. She reports that she does not drink alcohol or use illicit drugs.  Allergies  Allergen Reactions  . Codeine Sulfate Swelling  . Fish-Derived Products Other (See Comments)    Cellulitis   . Rocephin [Ceftriaxone Sodium]  Other (See Comments)    Low blood count  . Sulfa Antibiotics Other (See Comments)    Mouth and lip sores    Family History  Problem Relation Age of Onset  . Cancer Mother   . Heart disease Mother   . Heart disease Father   . Heart disease Brother   . Colon cancer Neg Hx    No history of sudden cardiac death or arrhythmias and family  Prior to Admission medications   Medication Sig Start Date End Date Taking? Authorizing Provider  ALPRAZolam Duanne Moron) 0.5 MG tablet Take 0.5 mg by mouth as needed for anxiety.    Yes Historical Provider, MD  aspirin 81 MG EC tablet Take 81 mg by mouth daily.     Yes Historical  Provider, MD  Cholecalciferol (VITAMIN D) 1000 UNITS capsule Take 2,000 Units by mouth daily.    Yes Historical Provider, MD  losartan (COZAAR) 25 MG tablet Take 25 mg by mouth daily.   Yes Historical Provider, MD  meclizine (ANTIVERT) 25 MG tablet Take 25 mg by mouth 3 (three) times daily as needed for dizziness.    Yes Historical Provider, MD  simvastatin (ZOCOR) 5 MG tablet Take 5 mg by mouth daily.   Yes Historical Provider, MD  azithromycin (ZITHROMAX Z-PAK) 250 MG tablet As directed Patient not taking: Reported on 03/07/2015 11/30/14   [name], MD  Ciprofloxacin (CIPRO PO) Take by mouth. 7 day course, completed on 03/18/15    Historical Provider, MD  ondansetron (ZOFRAN ODT) 4 MG disintegrating tablet Take 1 tablet (4 mg total) by mouth every 8 (eight) hours as needed for nausea. Patient not taking: Reported on 03/21/2015 08/28/12   Leonard Schwartz, MD     Physical Exam:  Filed Vitals:   03/21/15 1228 03/21/15 1512 03/21/15 1606  BP: 139/61 137/67 132/67  Pulse: 105 99 110  Temp: 98.1 F (36.7 C) 98.2 F (36.8 C)   TempSrc: Oral Oral   Resp: 20 20 15   SpO2: 100% 100% 98%    Constitutional: Vital signs reviewed.  Elderly female in no acute distress HEENT: no pallor, no icterus, moist oral mucosa, no cervical lymphadenopathy Cardiovascular: RRR, S1 normal, S2 normal, no MRG Chest: CTAB, no wheezes, rales, or rhonchi Abdominal: Soft. Non-tender, non-distended, bowel sounds are normal, Ext: warm, no edema Neurological: A&O x3, non focal  Labs on Admission:  Basic Metabolic Panel:  Recent Labs Lab 03/21/15 1239 03/21/15 1243  NA 142 141  K 3.5 3.5  CL 108 106  CO2 25  --   GLUCOSE 169* 164*  BUN 13 12  CREATININE 1.03* 0.90  CALCIUM 8.7*  --    Liver Function Tests: No results for input(s): AST, ALT, ALKPHOS, BILITOT, PROT, ALBUMIN in the last 168 hours. No results for input(s): LIPASE, AMYLASE in the last 168 hours. No results for input(s): AMMONIA in the last  168 hours. CBC:  Recent Labs Lab 03/21/15 1243 03/21/15 1304  WBC  --  7.3  NEUTROABS  --  5.7  HGB 11.9* 11.1*  HCT 35.0* 34.9*  MCV  --  93.8  PLT  --  162   Cardiac Enzymes: No results for input(s): CKTOTAL, CKMB, CKMBINDEX, TROPONINI in the last 168 hours. BNP: Invalid input(s): POCBNP CBG:  Recent Labs Lab 03/21/15 1249  GLUCAP 121*    Radiological Exams on Admission: Dg Abd Acute W/chest  03/21/2015   CLINICAL DATA:  74 year old female with abdominal pain radiating toward the left chest and clavicle.  Near syncope. Initial encounter.  EXAM: DG ABDOMEN ACUTE W/ 1V CHEST  COMPARISON:  Lumbar MRI 02/14/2012.  Chest radiographs 03/02/2008.  FINDINGS: Lung volumes remain normal. Normal cardiac size and mediastinal contours. Visualized tracheal air column is within normal limits. The lungs are clear. No pneumothorax or pneumoperitoneum. Postoperative changes to the right axilla and chest wall.  Non obstructed bowel gas pattern. Abdominal and pelvic visceral contours are within normal limits. Pelvic calcified atherosclerosis. Osteopenia. No acute osseous abnormality identified.  IMPRESSION: 1.  Normal bowel gas pattern, no free air. 2.  No acute cardiopulmonary abnormality.   Electronically Signed   By: Genevie Ann M.D.   On: 03/21/2015 14:01    EKG: Independently reviewed Sinus arrhythmia, no ST-T changes  Assessment/Plan  Principal Problem:   Near syncope Orthostatic versus vasovagal Admit under observation to telemetry. IV hydration with normal saline at 100 mL/h. Hold losartan. Repeat orthostasis in the morning. -EKG unremarkable except for sinus arrhythmia and no symptoms of chest pain or shortness of breath. Would not cycle further troponin. CT angiogram of the chest ordered by ED physician to rule out PE.  Active Problems:   Benign essential HTN Hold losartan  Dyslipidemia Continue statin     Diet: Regular  DVT prophylaxis: sq lovenox   Code Status: Full  code Family Communication: discussed with husband at bedside Disposition Plan: Home possibly tomorrow if orthostasis resolves  Tanairi Cypert, [place] [facility] Pager [phone]  Total time spent on admission 60 minutes  If 7PM-7AM, please contact night-coverage [email or web address] Password [email or web address] 03/21/2015, 5:22 PM

## 2015-03-21 NOTE — Progress Notes (Signed)
Pt denies any allergies to eggs or soy.

## 2015-03-21 NOTE — ED Notes (Signed)
Spoke to tara and pt can go to floor at 17:12

## 2015-03-21 NOTE — Progress Notes (Signed)
Stable to RR 

## 2015-03-21 NOTE — ED Notes (Signed)
Nurse drawing labs. 

## 2015-03-21 NOTE — ED Notes (Signed)
Per ems pt from dr office where pt had colonoscopy today. Per ems pt was near syncopal , drop in BP. Yet at the scene Bp 110/80 . Pt is alert and oriented x4. Pt is no longer dizzy at this time, yet feels nauseated and co flatulence.

## 2015-03-22 ENCOUNTER — Encounter: Payer: Self-pay | Admitting: Nurse Practitioner

## 2015-03-22 ENCOUNTER — Telehealth: Payer: Self-pay | Admitting: *Deleted

## 2015-03-22 DIAGNOSIS — R911 Solitary pulmonary nodule: Secondary | ICD-10-CM | POA: Diagnosis present

## 2015-03-22 DIAGNOSIS — R140 Abdominal distension (gaseous): Secondary | ICD-10-CM | POA: Diagnosis not present

## 2015-03-22 DIAGNOSIS — I951 Orthostatic hypotension: Secondary | ICD-10-CM | POA: Diagnosis not present

## 2015-03-22 DIAGNOSIS — R55 Syncope and collapse: Secondary | ICD-10-CM | POA: Diagnosis not present

## 2015-03-22 MED ORDER — ALUM & MAG HYDROXIDE-SIMETH 200-200-20 MG/5ML PO SUSP: 30.0000 mL | Freq: Four times a day (QID) | ORAL | Status: DC | PRN

## 2015-03-22 NOTE — Discharge Summary (Signed)
Physician Discharge Summary  Jennifer Yang [id number] DOB: Jun 16, 1941 DOA: 03/21/2015  PCP: Thressa Sheller, MD  Admit date: 03/21/2015 Discharge date: 03/22/2015  Time spent: 25 minutes  Recommendations for Outpatient Follow-up:  1. Discharge home with outpatient PCP follow-up 2. Patient has follow-up with her oncologist in July and I will recommend to have the CT lung finding of right pulmonary nodule seen on 03/21/2015 and decide on when to repeat a CT chest (6 months versus 12 months)  Discharge Diagnoses:  Principal Problem:   Near syncope  Active Problems:    Orthostatic hypotension   Benign essential HTN   Dyslipidemia   Solitary pulmonary nodule   Gaseous abdominal distention   History of right breast cancer   Discharge Condition: Fair  Diet recommendation: Low sodium  Filed Weights   03/21/15 1815  Weight: 76.658 kg (169 lb)    History of present illness:  Please refer to admission H&P for details, in brief, 73 year old female with a history of hypertension, dyslipidemia, history of breast cancer, vertigo who had a routine colonoscopy done today. Patient was at a restaurant  when she had gaseous distention of her abdomen and when she got up to pay for the food she felt like she was going to pass out and slumped on the floor. She denied losing consciousness, diaphoresis, chest pain or palpitations. Denies any shortness of breath. Denies any weakness of her arms or legs, tingling or numbness. Patient reports having 2-3 episodes of similar near syncopal episodes in the past with past. her blood pressure to be fairly stable at home. Denies any bowel or urinary symptoms.   Course in the ED Patient was significantly orthostatic with blood pressure dropping from 119/68 (lying) 289/41 upon standing with heart rate elevated from 101 -to 120. Initial blood work was unremarkable except for mild anemia. X-ray of the abdomen with chest showed nonspecific bowel gas pattern. EKG  showed sinus arrhythmia. Initial troponin was negative. Given upper abdominal discomfort with near syncope CT angiogram of the chest ordered by ED physician to rule out PE. Hospitalists admission requested to telemetry.   Hospital Course:  Principal Problem:  Near syncope Patient significantly orthostatic on presentation. Likely has some vasovagal component as well with gaseous abdominal pain triggering her symptoms. -Admitted to telemetry and placed on IV hydration. Losartan held. -Patient in the ED reported having abdominal discomfort with deep inspiration and given her near syncope CT and exam of the chest was done to rule out PE and was negative. -Orthostasis  rechecked this morning and negative. Patient however has been noted to be tachycardic to 120s off and on but asymptomatic. Patient is clinically stable to be discharged home with outpatient follow-up.   Active Problems:  Gaseous abdominal distention Abdominal x-ray on admission shows normal bowel gas pattern with no free air. She also reports having heartburn. Will discharge on Maalox.    Benign essential HTN Blood pressure stable. Orthostasis resolved. Will resume losartan.  Dyslipidemia Continue statin   Right upper lobe pulmonary nodule Incidental finding of 0.2 cm right upper lobe pulmonary nodule. No history of smoking. Recommended for follow-up chest CT in one year. However given her history of right breast cancer she might need a follow-up chest CT earlier. She has an appointment with her oncologist (Dr. Marin Olp) in July and needs to address this.    Code Status: Full code Family Communication: discussed with husband at bedside  Disposition Plan: Home    Discharge Exam: Filed Vitals:   03/22/15 0938  BP: 120/42  Pulse: 95  Temp: 98.4 F (36.9 C)  Resp: 18    General: Elderly female in no acute distress HEENT: No pallor, moist oral mucosa, supple neck Chest: Clear to auscultation bilaterally CVS:  Normal S1 and S2, no murmurs or gallop GI: Mild lower abdominal distention, nontender, bowel sounds present Musculoskeletal: Warm, no edema CNS: Alert and oriented   Discharge Instructions    Current Discharge Medication List    START taking these medications   Details  alum & mag hydroxide-simeth (MAALOX/MYLANTA) 200-200-20 MG/5ML suspension Take 30 mLs by mouth every 6 (six) hours as needed for indigestion or heartburn. Qty: 355 mL, Refills: 0      CONTINUE these medications which have NOT CHANGED   Details  ALPRAZolam (XANAX) 0.5 MG tablet Take 0.5 mg by mouth as needed for anxiety.     aspirin 81 MG EC tablet Take 81 mg by mouth daily.      Cholecalciferol (VITAMIN D) 1000 UNITS capsule Take 2,000 Units by mouth daily.     losartan (COZAAR) 25 MG tablet Take 25 mg by mouth daily.    meclizine (ANTIVERT) 25 MG tablet Take 25 mg by mouth 3 (three) times daily as needed for dizziness.     simvastatin (ZOCOR) 5 MG tablet Take 5 mg by mouth daily.    ondansetron (ZOFRAN ODT) 4 MG disintegrating tablet Take 1 tablet (4 mg total) by mouth every 8 (eight) hours as needed for nausea. Qty: 20 tablet, Refills: 0      STOP taking these medications     azithromycin (ZITHROMAX Z-PAK) 250 MG tablet      Ciprofloxacin (CIPRO PO)        Allergies  Allergen Reactions  . Codeine Sulfate Swelling  . Fish-Derived Products Other (See Comments)    Cellulitis   . Rocephin [Ceftriaxone Sodium] Other (See Comments)    Low blood count  . Sulfa Antibiotics Other (See Comments)    Mouth and lip sores   Follow-up Information    Follow up with Thressa Sheller, MD. Schedule an appointment as soon as possible for a visit in 3 weeks.   Specialty:  Internal Medicine   Contact information:   [address] [place] [address] [phone]        The results of significant diagnostics from this hospitalization (including imaging, microbiology, ancillary and  laboratory) are listed below for reference.    Significant Diagnostic Studies: Ct Angio Chest Pe W/cm &/or Wo Cm  03/21/2015   CLINICAL DATA:  Chest tightness and shortness of breath. Status post colonoscopy today. Remote history of breast cancer.  EXAM: CT ANGIOGRAPHY CHEST WITH CONTRAST  TECHNIQUE: Multidetector CT imaging of the chest was performed using the standard protocol during bolus administration of intravenous contrast. Multiplanar CT image reconstructions and MIPs were obtained to evaluate the vascular anatomy.  CONTRAST:  100 mL OMNIPAQUE IOHEXOL 350 MG/ML SOLN  COMPARISON:  Chest in two views abdomen earlier this same day.  FINDINGS: No pulmonary embolus is identified. Heart size is normal. Trace bilateral pleural effusions and a very small amount of pericardial fluid are identified. The patient is status post right mastectomy and axillary dissection. No axillary, hilar or mediastinal lymphadenopathy is identified. Scattered aortic and coronary calcific atherosclerosis is noted.  A 0.2 cm subpleural nodule is seen in the right upper lobe anteriorly on image 30. Mild dependent atelectasis is noted. The lungs are otherwise clear.  Visualized upper abdomen shows perihepatic and perisplenic ascites imaged  intra-abdominal contents are otherwise unremarkable. No lytic or sclerotic bony lesion is identified.  Review of the MIP images confirms the above findings.  IMPRESSION: Negative for pulmonary embolus.  Trace bilateral pleural effusions.  0.2 cm right upper lobe pulmonary nodule. If the patient is at high risk for bronchogenic carcinoma, follow-up chest CT at 1 year is recommended. If the patient is at low risk, no follow-up is needed. This recommendation follows the consensus statement: Guidelines for Management of Small Pulmonary Nodules Detected on CT Scans: A Statement from the [employer] as published in Radiology 2005; 237:395-400.  Perihepatic and perisplenic ascites. Cause for this  finding is not identified.   Electronically Signed   By: Inge Rise M.D.   On: 03/21/2015 19:34   Dg Abd Acute W/chest  03/21/2015   CLINICAL DATA:  75 year old female with abdominal pain radiating toward the left chest and clavicle. Near syncope. Initial encounter.  EXAM: DG ABDOMEN ACUTE W/ 1V CHEST  COMPARISON:  Lumbar MRI 02/14/2012.  Chest radiographs 03/02/2008.  FINDINGS: Lung volumes remain normal. Normal cardiac size and mediastinal contours. Visualized tracheal air column is within normal limits. The lungs are clear. No pneumothorax or pneumoperitoneum. Postoperative changes to the right axilla and chest wall.  Non obstructed bowel gas pattern. Abdominal and pelvic visceral contours are within normal limits. Pelvic calcified atherosclerosis. Osteopenia. No acute osseous abnormality identified.  IMPRESSION: 1.  Normal bowel gas pattern, no free air. 2.  No acute cardiopulmonary abnormality.   Electronically Signed   By: Genevie Ann M.D.   On: 03/21/2015 14:01    Microbiology: No results found for this or any previous visit (from the past 240 hour(s)).   Labs: Basic Metabolic Panel:  Recent Labs Lab 03/21/15 1239 03/21/15 1243  NA 142 141  K 3.5 3.5  CL 108 106  CO2 25  --   GLUCOSE 169* 164*  BUN 13 12  CREATININE 1.03* 0.90  CALCIUM 8.7*  --    Liver Function Tests: No results for input(s): AST, ALT, ALKPHOS, BILITOT, PROT, ALBUMIN in the last 168 hours. No results for input(s): LIPASE, AMYLASE in the last 168 hours. No results for input(s): AMMONIA in the last 168 hours. CBC:  Recent Labs Lab 03/21/15 1243 03/21/15 1304  WBC  --  7.3  NEUTROABS  --  5.7  HGB 11.9* 11.1*  HCT 35.0* 34.9*  MCV  --  93.8  PLT  --  162   Cardiac Enzymes: No results for input(s): CKTOTAL, CKMB, CKMBINDEX, TROPONINI in the last 168 hours. BNP: BNP (last 3 results) No results for input(s): BNP in the last 8760 hours.  ProBNP (last 3 results) No results for input(s): PROBNP in the  last 8760 hours.  CBG:  Recent Labs Lab 03/21/15 1249  GLUCAP 121*       Signed:  Brittany Osier  [facility] 03/22/2015, 10:15 AM

## 2015-03-22 NOTE — Progress Notes (Signed)
Spoke with patient who was concerned regarding her recent CT of chest. Per Dr. Marin Olp patient is ok to keep appointment in July. He has reviewed scans and notes from hospitalization. Pt is aware and verbalized understanding and appreciation.

## 2015-03-22 NOTE — Progress Notes (Signed)
DIscharged instructions reviewed with patient utilizing teach back method no questions at this time. Patient discharged to home.

## 2015-03-22 NOTE — Progress Notes (Signed)
UR completed 

## 2015-03-22 NOTE — Discharge Instructions (Signed)

## 2015-03-22 NOTE — Telephone Encounter (Signed)
No answer, left message to call if questions or concerns. 

## 2015-03-27 ENCOUNTER — Encounter: Payer: Self-pay | Admitting: Gastroenterology

## 2015-05-14 ENCOUNTER — Other Ambulatory Visit: Payer: Self-pay | Admitting: Dermatology

## 2015-05-14 ENCOUNTER — Other Ambulatory Visit: Payer: Self-pay

## 2015-05-28 ENCOUNTER — Other Ambulatory Visit: Payer: Medicare Other | Admitting: Lab

## 2015-05-28 ENCOUNTER — Ambulatory Visit: Payer: Medicare Other

## 2015-05-28 ENCOUNTER — Ambulatory Visit: Payer: Medicare Other | Admitting: Hematology & Oncology

## 2015-05-31 ENCOUNTER — Ambulatory Visit (HOSPITAL_BASED_OUTPATIENT_CLINIC_OR_DEPARTMENT_OTHER): Payer: Medicare Other

## 2015-05-31 ENCOUNTER — Other Ambulatory Visit (HOSPITAL_BASED_OUTPATIENT_CLINIC_OR_DEPARTMENT_OTHER): Payer: Medicare Other

## 2015-05-31 ENCOUNTER — Encounter: Payer: Self-pay | Admitting: Family

## 2015-05-31 ENCOUNTER — Ambulatory Visit (HOSPITAL_BASED_OUTPATIENT_CLINIC_OR_DEPARTMENT_OTHER): Payer: Medicare Other | Admitting: Family

## 2015-05-31 VITALS — BP 191/78 | HR 81 | Temp 97.7°F | Resp 14 | Ht 69.0 in | Wt 177.0 lb

## 2015-05-31 DIAGNOSIS — M899 Disorder of bone, unspecified: Secondary | ICD-10-CM | POA: Diagnosis not present

## 2015-05-31 DIAGNOSIS — Z853 Personal history of malignant neoplasm of breast: Secondary | ICD-10-CM

## 2015-05-31 DIAGNOSIS — R911 Solitary pulmonary nodule: Secondary | ICD-10-CM

## 2015-05-31 DIAGNOSIS — C50912 Malignant neoplasm of unspecified site of left female breast: Secondary | ICD-10-CM

## 2015-05-31 DIAGNOSIS — T50905A Adverse effect of unspecified drugs, medicaments and biological substances, initial encounter: Secondary | ICD-10-CM

## 2015-05-31 DIAGNOSIS — I890 Lymphedema, not elsewhere classified: Secondary | ICD-10-CM

## 2015-05-31 DIAGNOSIS — M818 Other osteoporosis without current pathological fracture: Secondary | ICD-10-CM

## 2015-05-31 LAB — CBC WITH DIFFERENTIAL (CANCER CENTER ONLY)
BASO#: 0 10*3/uL (ref 0.0–0.2)
BASO%: 0.6 % (ref 0.0–2.0)
EOS ABS: 0.1 10*3/uL (ref 0.0–0.5)
EOS%: 1.5 % (ref 0.0–7.0)
HEMATOCRIT: 40 % (ref 34.8–46.6)
HGB: 13.1 g/dL (ref 11.6–15.9)
LYMPH#: 1.5 10*3/uL (ref 0.9–3.3)
LYMPH%: 29.6 % (ref 14.0–48.0)
MCH: 30.7 pg (ref 26.0–34.0)
MCHC: 32.8 g/dL (ref 32.0–36.0)
MCV: 94 fL (ref 81–101)
MONO#: 0.4 10*3/uL (ref 0.1–0.9)
MONO%: 7.5 % (ref 0.0–13.0)
NEUT%: 60.8 % (ref 39.6–80.0)
NEUTROS ABS: 3.2 10*3/uL (ref 1.5–6.5)
Platelets: 149 10*3/uL (ref 145–400)
RBC: 4.27 10*6/uL (ref 3.70–5.32)
RDW: 12.7 % (ref 11.1–15.7)
WBC: 5.2 10*3/uL (ref 3.9–10.0)

## 2015-05-31 LAB — CMP (CANCER CENTER ONLY)
ALK PHOS: 49 U/L (ref 26–84)
ALT: 18 U/L (ref 10–47)
AST: 25 U/L (ref 11–38)
Albumin: 3.8 g/dL (ref 3.3–5.5)
BILIRUBIN TOTAL: 1.1 mg/dL (ref 0.20–1.60)
BUN, Bld: 17 mg/dL (ref 7–22)
CO2: 27 meq/L (ref 18–33)
Calcium: 9.2 mg/dL (ref 8.0–10.3)
Chloride: 111 mEq/L — ABNORMAL HIGH (ref 98–108)
Creat: 1 mg/dl (ref 0.6–1.2)
Glucose, Bld: 91 mg/dL (ref 73–118)
Potassium: 3.6 mEq/L (ref 3.3–4.7)
Sodium: 145 mEq/L (ref 128–145)
TOTAL PROTEIN: 7.1 g/dL (ref 6.4–8.1)

## 2015-05-31 MED ORDER — AZITHROMYCIN 250 MG PO TABS: ORAL_TABLET | ORAL | Status: DC

## 2015-05-31 MED ORDER — ZOLEDRONIC ACID 4 MG/100ML IV SOLN
4.0000 mg | Freq: Once | INTRAVENOUS | Status: AC
  Administered 2015-05-31: 4 mg via INTRAVENOUS
  Filled 2015-05-31: qty 100

## 2015-05-31 NOTE — Patient Instructions (Signed)

## 2015-05-31 NOTE — Progress Notes (Signed)
Hematology and Oncology Follow Up Visit  Jennifer Yang [id number] 02/01/1941 74 y.o. 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 ER(+)  Current Therapy:   Zometa once a year     Interim History:  Jennifer Yang is here today with her husband for a follow-up. She went to the ED in May for a near syncopal episode. She had had a colonoscopy that morning and ended up dehydrated. She was given 6 L of fluid over the course of 24 hours. She also had a Ct angio to rule out a PE and found a 0.2 cm nodule in her right upper lobe.  She is asymptomatic at this time and has no complaints.  She has had no fever, chills, n/v, cough, rash, dizziness, SOB, chest pain, palpitations, abdominal pain, constipation, diarrhea, blood in urine or stool.  No swelling, tenderness, numbness or tingling in her extremities. No new aches or pains.  There has been no changes with her breast. No rash, lesion, mass or lymphadenopathy.  Her mammogram in March was negative.  She is eating healthy and staying well hydrated. Her weight is stable.    Medications:    Medication List       This list is accurate as of: 05/31/15  8:35 AM.  Always use your most recent med list.               ALPRAZolam 0.5 MG tablet  Commonly known as:  XANAX  Take 0.5 mg by mouth as needed for anxiety.     alum & mag hydroxide-simeth 200-200-20 MG/5ML suspension  Commonly known as:  MAALOX/MYLANTA  Take 30 mLs by mouth every 6 (six) hours as needed for indigestion or heartburn.     aspirin 81 MG EC tablet  Take 81 mg by mouth daily.     losartan 25 MG tablet  Commonly known as:  COZAAR  Take 25 mg by mouth daily.     meclizine 25 MG tablet  Commonly known as:  ANTIVERT  Take 25 mg by mouth 3 (three) times daily as needed for dizziness.     ondansetron 4 MG disintegrating tablet  Commonly known as:  ZOFRAN ODT    Take 1 tablet (4 mg total) by mouth every 8 (eight) hours as needed for nausea.     simvastatin 5 MG tablet  Commonly known as:  ZOCOR  Take 5 mg by mouth daily.     Vitamin D 1000 UNITS capsule  Take 2,000 Units by mouth daily.        Allergies:  Allergies  Allergen Reactions  . Codeine Sulfate Swelling  . Fish-Derived Products Other (See Comments)    Cellulitis   . Rocephin [Ceftriaxone Sodium] Other (See Comments)    Low blood count  . Sulfa Antibiotics Other (See Comments)    Mouth and lip sores    Past Medical History, Surgical history, Social history, and Family History were reviewed and updated.  Review of Systems: All other 10 point review of systems is negative.   Physical Exam:  vitals were not taken for this visit.  Wt Readings from Last 3 Encounters:  03/21/15 169 lb (76.658 kg)  03/21/15 171 lb (77.565 kg)  03/07/15 171 lb (77.565 kg)    Ocular: Sclerae unicteric, pupils equal, round and reactive to light Ear-nose-throat: Oropharynx clear, dentition fair Lymphatic: No cervical or supraclavicular adenopathy  Lungs no rales or rhonchi, good excursion bilaterally Heart regular rate and rhythm, no murmur appreciated Abd soft, nontender, positive bowel sounds MSK no focal spinal tenderness, no joint edema Neuro: non-focal, well-oriented, appropriate affect Breasts: No changes. No mass, lesion, rash or lymphadenopathy.   Lab Results  Component Value Date   WBC 7.3 03/21/2015   HGB 11.1* 03/21/2015   HCT 34.9* 03/21/2015   MCV 93.8 03/21/2015   PLT 162 03/21/2015   No results found for: FERRITIN, IRON, TIBC, UIBC, IRONPCTSAT Lab Results  Component Value Date   RBC 3.72* 03/21/2015   No results found for: KPAFRELGTCHN, LAMBDASER, KAPLAMBRATIO No results found for: IGGSERUM, IGA, IGMSERUM No results found for: Odetta Pink, SPEI   Chemistry      Component Value Date/Time   NA 141 03/21/2015  1243   NA 142 11/30/2014 0939   NA 144 05/24/2013 0959   K 3.5 03/21/2015 1243   K 3.8 11/30/2014 0939   K 3.9 05/24/2013 0959   CL 106 03/21/2015 1243   CL 102 11/30/2014 0939   CO2 25 03/21/2015 1239   CO2 28 11/30/2014 0939   CO2 27 05/24/2013 0959   BUN 12 03/21/2015 1243   BUN 21 11/30/2014 0939   BUN 19.7 05/24/2013 0959   CREATININE 0.90 03/21/2015 1243   CREATININE 1.0 11/30/2014 0939   CREATININE 1.1 05/24/2013 0959      Component Value Date/Time   CALCIUM 8.7* 03/21/2015 1239   CALCIUM 9.4 11/30/2014 0939   CALCIUM 9.8 05/24/2013 0959   ALKPHOS 45 11/30/2014 0939   ALKPHOS 38* 05/27/2012 0907   AST 20 11/30/2014 0939   AST 15 05/27/2012 0907   ALT 15 11/30/2014 0939   ALT 14 05/27/2012 0907   BILITOT 1.00 11/30/2014 0939   BILITOT 0.6 05/27/2012 0907     Impression and Plan: Jennifer Yang is a pleasant 74 yo white female with multiple right breast cancers. She's had 2 separate primaries both stage I and ER positive. She did undergo a right mastectomy.  She is doing well and is asymptomatic at this time. Her CBC and CMP today look good.  She had a CT angio in May that showed a 0.2 cm nodule in the right upper lobe.  We will plan to see her back in 6 months for labs and follow-up. We will repeat her CT scan in the spring.  She knows to call here with any questions or concerns. We can certainly see her sooner if need be.    Eliezer Bottom, NP 7/14/20168:35 AM

## 2015-06-01 ENCOUNTER — Other Ambulatory Visit: Payer: Self-pay

## 2015-06-01 DIAGNOSIS — C50912 Malignant neoplasm of unspecified site of left female breast: Secondary | ICD-10-CM

## 2015-06-01 LAB — VITAMIN D 25 HYDROXY (VIT D DEFICIENCY, FRACTURES): Vit D, 25-Hydroxy: 32 ng/mL (ref 30–100)

## 2015-06-01 MED ORDER — AZITHROMYCIN 250 MG PO TABS: ORAL_TABLET | ORAL | Status: DC

## 2015-06-26 ENCOUNTER — Other Ambulatory Visit (HOSPITAL_COMMUNITY): Payer: Self-pay | Admitting: Internal Medicine

## 2015-06-26 DIAGNOSIS — R011 Cardiac murmur, unspecified: Secondary | ICD-10-CM

## 2015-07-04 ENCOUNTER — Other Ambulatory Visit: Payer: Self-pay

## 2015-07-04 ENCOUNTER — Ambulatory Visit (HOSPITAL_COMMUNITY): Payer: Medicare Other | Attending: Cardiovascular Disease

## 2015-07-04 DIAGNOSIS — I313 Pericardial effusion (noninflammatory): Secondary | ICD-10-CM | POA: Diagnosis not present

## 2015-07-04 DIAGNOSIS — R011 Cardiac murmur, unspecified: Secondary | ICD-10-CM | POA: Diagnosis not present

## 2015-07-04 DIAGNOSIS — I351 Nonrheumatic aortic (valve) insufficiency: Secondary | ICD-10-CM | POA: Diagnosis not present

## 2015-07-04 DIAGNOSIS — I340 Nonrheumatic mitral (valve) insufficiency: Secondary | ICD-10-CM | POA: Insufficient documentation

## 2015-11-26 ENCOUNTER — Other Ambulatory Visit (HOSPITAL_BASED_OUTPATIENT_CLINIC_OR_DEPARTMENT_OTHER): Payer: Medicare Other

## 2015-11-26 ENCOUNTER — Encounter: Payer: Self-pay | Admitting: Hematology & Oncology

## 2015-11-26 ENCOUNTER — Ambulatory Visit (HOSPITAL_BASED_OUTPATIENT_CLINIC_OR_DEPARTMENT_OTHER): Payer: Medicare Other | Admitting: Hematology & Oncology

## 2015-11-26 VITALS — BP 168/83 | HR 88 | Temp 97.5°F | Resp 16 | Ht 69.0 in | Wt 183.0 lb

## 2015-11-26 DIAGNOSIS — C50912 Malignant neoplasm of unspecified site of left female breast: Secondary | ICD-10-CM

## 2015-11-26 DIAGNOSIS — Z853 Personal history of malignant neoplasm of breast: Secondary | ICD-10-CM | POA: Diagnosis not present

## 2015-11-26 DIAGNOSIS — R911 Solitary pulmonary nodule: Secondary | ICD-10-CM

## 2015-11-26 DIAGNOSIS — T50905A Adverse effect of unspecified drugs, medicaments and biological substances, initial encounter: Secondary | ICD-10-CM

## 2015-11-26 DIAGNOSIS — L03111 Cellulitis of right axilla: Secondary | ICD-10-CM

## 2015-11-26 DIAGNOSIS — M818 Other osteoporosis without current pathological fracture: Secondary | ICD-10-CM

## 2015-11-26 LAB — CMP (CANCER CENTER ONLY)
ALK PHOS: 38 U/L (ref 26–84)
ALT: 18 U/L (ref 10–47)
AST: 27 U/L (ref 11–38)
Albumin: 4 g/dL (ref 3.3–5.5)
BUN: 21 mg/dL (ref 7–22)
CHLORIDE: 104 meq/L (ref 98–108)
CO2: 26 mEq/L (ref 18–33)
CREATININE: 1 mg/dL (ref 0.6–1.2)
Calcium: 9.9 mg/dL (ref 8.0–10.3)
Glucose, Bld: 95 mg/dL (ref 73–118)
Potassium: 3.9 mEq/L (ref 3.3–4.7)
SODIUM: 143 meq/L (ref 128–145)
TOTAL PROTEIN: 7.6 g/dL (ref 6.4–8.1)
Total Bilirubin: 0.9 mg/dl (ref 0.20–1.60)

## 2015-11-26 LAB — CBC WITH DIFFERENTIAL (CANCER CENTER ONLY)
BASO#: 0 10*3/uL (ref 0.0–0.2)
BASO%: 0.7 % (ref 0.0–2.0)
EOS%: 1.2 % (ref 0.0–7.0)
Eosinophils Absolute: 0.1 10*3/uL (ref 0.0–0.5)
HCT: 42.8 % (ref 34.8–46.6)
HGB: 13.8 g/dL (ref 11.6–15.9)
LYMPH#: 1.9 10*3/uL (ref 0.9–3.3)
LYMPH%: 31.8 % (ref 14.0–48.0)
MCH: 30.4 pg (ref 26.0–34.0)
MCHC: 32.2 g/dL (ref 32.0–36.0)
MCV: 94 fL (ref 81–101)
MONO#: 0.5 10*3/uL (ref 0.1–0.9)
MONO%: 8.7 % (ref 0.0–13.0)
NEUT#: 3.5 10*3/uL (ref 1.5–6.5)
NEUT%: 57.6 % (ref 39.6–80.0)
PLATELETS: 177 10*3/uL (ref 145–400)
RBC: 4.54 10*6/uL (ref 3.70–5.32)
RDW: 12.1 % (ref 11.1–15.7)
WBC: 6 10*3/uL (ref 3.9–10.0)

## 2015-11-26 MED ORDER — AZITHROMYCIN 250 MG PO TABS: ORAL_TABLET | ORAL | Status: DC

## 2015-11-26 MED FILL — AZITHROMYCIN 250 MG TABLET: 250 | 5 days supply | Qty: 6 | Fill #0

## 2015-11-26 NOTE — Progress Notes (Signed)
Hematology and Oncology Follow Up Visit  Jennifer Yang [id number] 02-18-1941 75 y.o. 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   ER(+)  Current Therapy:    observation     Interim History:  Ms.  Yang is in for her follow-up. She is doing well. She had a proms over the base given increases holidays. She mated and despite the snow that we had over the weekend.  She has had no complaints. She's had no cough or shortness of breath. She's had no nausea or vomiting. His been no change in bowel or bladder habits.  She is due for a mammogram in March.  She's had no rashes.  She's had no weight loss waking.  She's had no headache.  We are watching a 2 mm nodule in the right upper lobe of her lung. This is found on a CT scan done back in May. We will repeat a CT scan will see her back in about 5 months.  Her performance status is ECOG 0    Medications:  Current outpatient prescriptions:  .  ALPRAZolam (XANAX) 0.5 MG tablet, Take 0.5 mg by mouth as needed for anxiety. , Disp: , Rfl:  .  alum & mag hydroxide-simeth (MAALOX/MYLANTA) 200-200-20 MG/5ML suspension, Take 30 mLs by mouth every 6 (six) hours as needed for indigestion or heartburn., Disp: 355 mL, Rfl: 0 .  aspirin 81 MG EC tablet, Take 81 mg by mouth daily.  , Disp: , Rfl:  .  azithromycin (ZITHROMAX Z-PAK) 250 MG tablet, Take as directed on package., Disp: 6 each, Rfl: 3 .  Cholecalciferol (VITAMIN D) 1000 UNITS capsule, Take 2,000 Units by mouth daily. , Disp: , Rfl:  .  losartan (COZAAR) 25 MG tablet, Take 25 mg by mouth daily., Disp: , Rfl:  .  meclizine (ANTIVERT) 25 MG tablet, Take 25 mg by mouth 3 (three) times daily as needed for dizziness. , Disp: , Rfl:  .  ondansetron (ZOFRAN ODT) 4 MG disintegrating tablet, Take 1 tablet (4 mg total) by mouth every 8 (eight) hours as needed for nausea., Disp: 20  tablet, Rfl: 0 .  simvastatin (ZOCOR) 5 MG tablet, Take 5 mg by mouth daily., Disp: , Rfl:   Allergies:  Allergies  Allergen Reactions  . Codeine Sulfate Swelling  . Fish-Derived Products Other (See Comments)    Cellulitis   . Rocephin [Ceftriaxone Sodium] Other (See Comments)    Low blood count  . Sulfa Antibiotics Other (See Comments)    Mouth and lip sores    Past Medical History, Surgical history, Social history, and Family History were reviewed and updated.  Review of Systems: As above  Physical Exam:  height is 5\' 9"  (1.753 m) and weight is 183 lb (83.008 kg). Her oral temperature is 97.5 F (36.4 C). Her blood pressure is 168/83 and her pulse is 88. Her respiration is 16.   Well-developed and well-nourished white female. Her head and neck exam is nausea or oral lesion. She is no palpable cervical or supraclavicular lymph nodes. Lungs are clear a lot. Cardiac exam regular rate and rhythm with a normal S1 and S2. Breast exam shows left breast with no masses edema or erythema. There is no left axillary adenopathy. Right chest wall shows a well-healed mastectomy.there is no nodule. There is no erythema. There is no right axillary adenopathy. Abdomen  soft. Has good bowel sounds. There is no fluid wave. No palpable liver or spleen tip. Back exam shows no tenderness over the spine ribs or hips. There is no kyphosis. Extremities shows no clubbing cyanosis or edema. Maybe some slight lymphedema of the right arm. She's good range of motion of the joints. Skin exam shows a suspicious hyperpigmented lesion on the back. This is in the mid back. This is just to the right of the spine. Neurological exam is nonfocal.  Lab Results  Component Value Date   WBC 6.0 11/26/2015   HGB 13.8 11/26/2015   HCT 42.8 11/26/2015   MCV 94 11/26/2015   PLT 177 11/26/2015     Chemistry      Component Value Date/Time   NA 143 11/26/2015 0809   NA 141 03/21/2015 1243   NA 144 05/24/2013 0959   K 3.9  11/26/2015 0809   K 3.5 03/21/2015 1243   K 3.9 05/24/2013 0959   CL 104 11/26/2015 0809   CL 106 03/21/2015 1243   CO2 26 11/26/2015 0809   CO2 25 03/21/2015 1239   CO2 27 05/24/2013 0959   BUN 21 11/26/2015 0809   BUN 12 03/21/2015 1243   BUN 19.7 05/24/2013 0959   CREATININE 1.0 11/26/2015 0809   CREATININE 0.90 03/21/2015 1243   CREATININE 1.1 05/24/2013 0959      Component Value Date/Time   CALCIUM 9.9 11/26/2015 0809   CALCIUM 8.7* 03/21/2015 1239   CALCIUM 9.8 05/24/2013 0959   ALKPHOS 38 11/26/2015 0809   ALKPHOS 38* 05/27/2012 0907   AST 27 11/26/2015 0809   AST 15 05/27/2012 0907   ALT 18 11/26/2015 0809   ALT 14 05/27/2012 0907   BILITOT 0.90 11/26/2015 0809   BILITOT 0.6 05/27/2012 0907         Impression and Plan: Jennifer Yang is 75 year old white female with multiple right breast cancers. She's had 2 separate primaries. They're both stage I. They're both  ER positive. She underwent  a mastectomy.  There is a looks fantastic. She is due for a mammogram in March.  I will go ahead and set her with a CT scan the day that we see her back. I want make sure that this right upper lobe nodule is not an issue for [place].  Otherwise we will see her back in June 2017  [name], MD 1/9/20179:03 AM

## 2015-11-27 LAB — VITAMIN D 25 HYDROXY (VIT D DEFICIENCY, FRACTURES): Vitamin D, 25-Hydroxy: 37.2 ng/mL (ref 30.0–100.0)

## 2016-01-17 ENCOUNTER — Emergency Department (HOSPITAL_BASED_OUTPATIENT_CLINIC_OR_DEPARTMENT_OTHER)
Admission: EM | Admit: 2016-01-17 | Discharge: 2016-01-17 | Disposition: A | Discharge status: Home / Self Care | Payer: Medicare Other | Attending: Emergency Medicine | Admitting: Emergency Medicine

## 2016-01-17 ENCOUNTER — Encounter (HOSPITAL_BASED_OUTPATIENT_CLINIC_OR_DEPARTMENT_OTHER): Payer: Self-pay | Admitting: *Deleted

## 2016-01-17 ENCOUNTER — Emergency Department (HOSPITAL_BASED_OUTPATIENT_CLINIC_OR_DEPARTMENT_OTHER): Payer: Medicare Other

## 2016-01-17 DIAGNOSIS — Z7982 Long term (current) use of aspirin: Secondary | ICD-10-CM | POA: Diagnosis not present

## 2016-01-17 DIAGNOSIS — I10 Essential (primary) hypertension: Secondary | ICD-10-CM | POA: Insufficient documentation

## 2016-01-17 DIAGNOSIS — E785 Hyperlipidemia, unspecified: Secondary | ICD-10-CM | POA: Diagnosis not present

## 2016-01-17 DIAGNOSIS — Z87448 Personal history of other diseases of urinary system: Secondary | ICD-10-CM | POA: Insufficient documentation

## 2016-01-17 DIAGNOSIS — F419 Anxiety disorder, unspecified: Secondary | ICD-10-CM | POA: Diagnosis not present

## 2016-01-17 DIAGNOSIS — Z859 Personal history of malignant neoplasm, unspecified: Secondary | ICD-10-CM | POA: Insufficient documentation

## 2016-01-17 DIAGNOSIS — J40 Bronchitis, not specified as acute or chronic: Secondary | ICD-10-CM | POA: Insufficient documentation

## 2016-01-17 DIAGNOSIS — J069 Acute upper respiratory infection, unspecified: Secondary | ICD-10-CM | POA: Diagnosis not present

## 2016-01-17 DIAGNOSIS — M818 Other osteoporosis without current pathological fracture: Secondary | ICD-10-CM | POA: Insufficient documentation

## 2016-01-17 DIAGNOSIS — Z853 Personal history of malignant neoplasm of breast: Secondary | ICD-10-CM | POA: Diagnosis not present

## 2016-01-17 DIAGNOSIS — R05 Cough: Secondary | ICD-10-CM | POA: Diagnosis present

## 2016-01-17 DIAGNOSIS — Z79899 Other long term (current) drug therapy: Secondary | ICD-10-CM | POA: Diagnosis not present

## 2016-01-17 DIAGNOSIS — M1990 Unspecified osteoarthritis, unspecified site: Secondary | ICD-10-CM | POA: Insufficient documentation

## 2016-01-17 DIAGNOSIS — R110 Nausea: Secondary | ICD-10-CM | POA: Diagnosis not present

## 2016-01-17 NOTE — ED Notes (Signed)
Pt. Reports a cough since Monday and fever at home.  Pt. Reports she saw her PMD on Monday and the PA said no s/s of lung problems gave no scripts.  Pt. Reports no chest x ray done.  Pt. Said she is coughing up but it goes back down and she does not see it or spit it out.

## 2016-01-17 NOTE — Discharge Instructions (Signed)
Continue the Mucinex. Chest x-ray negative for pneumonia. Return for any new or worse symptoms. Follow-up with your primary care doctor as needed.

## 2016-01-17 NOTE — ED Provider Notes (Addendum)
CSN: [id number]     Arrival date & time 01/17/16  1400 History   First MD Initiated Contact with Patient 01/17/16 1540     Chief Complaint  Patient presents with  . URI     (Consider location/radiation/quality/duration/timing/severity/associated sxs/prior Treatment) Patient is a 75 y.o. female presenting with URI. The history is provided by the patient and the spouse.  URI Presenting symptoms: congestion, cough and fever   Associated symptoms: no headaches and no myalgias    patient with the complaint of cough congestion cough partially productive. Nausea but no vomiting no diarrhea. Patient seen by primary care doctor and treated symptomatically. No chest x-ray done. Patient is concerned that she has pneumonia. Patient was sick contact at home with similar symptoms. Patient also with complaint of fever. Symptoms present since Monday.  Past Medical History  Diagnosis Date  . MVP (mitral valve prolapse)   . Allergy     shell fish  . Hyperlipidemia   . Hypertension   . Arthritis     knees  . Cancer [facility])     right Chemo/radiation '01/radical '09  . Thyroid disease   . Family history of breast cancer     mother  . Breast cancer, stage 1 ([place]) 07/16/1990  . Breast cancer, right breast ([place]) 02/16/2008  . Lymphedema of arm 11/24/2011    right arm  . Benign essential HTN 11/24/2011  . 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 PONV (postoperative nausea and vomiting)     has vertigo also-would like scop patch  . Anxiety     takes xanax on a rare occasion  . Vertigo   . Contact lens/glasses fitting   . Bladder infection 03/18/15    completed antibotics    Past Surgical History  Procedure Laterality Date  . Thyroidectomy, partial    . Breast lumpectomy      2001 right  . Mastectomy, radical      2009 right  . Knee arthroscopy      left  . Foot neuroma surgery      left  . Upper gastrointestinal endoscopy    . Foot amputation      right hammer toe  . Mass excision  07/21/2012   Procedure: EXCISION MASS;  Surgeon: Adin Hector, MD;  Location: [facility];  Service: General;  Laterality: N/A;  excision 15cm posterior neck mass posterior  . Colonoscopy  2006    w/Dr.Orr   Family History  Problem Relation Age of Onset  . Cancer Mother   . Heart disease Mother   . Heart disease Father   . Heart disease Brother   . Colon cancer Neg Hx    Social History  Substance Use Topics  . Smoking status: Never Smoker   . Smokeless tobacco: Never Used     Comment: NEVER USED TOBACCO  . Alcohol Use: No   OB History    No data available     Review of Systems  Constitutional: Positive for fever.  HENT: Positive for congestion.   Eyes: Negative for redness.  Respiratory: Positive for cough and shortness of breath.   Cardiovascular: Negative for chest pain.  Gastrointestinal: Positive for nausea. Negative for vomiting and abdominal pain.  Genitourinary: Negative for dysuria.  Musculoskeletal: Negative for myalgias.  Skin: Negative for rash.  Neurological: Negative for headaches.  Hematological: Does not bruise/bleed easily.  Psychiatric/Behavioral: Negative for confusion.      Allergies  Codeine sulfate; Fish-derived products; Rocephin; and Sulfa antibiotics  Home  Medications   Prior to Admission medications   Medication Sig Start Date End Date Taking? Authorizing Provider  ALPRAZolam Duanne Moron) 0.5 MG tablet Take 0.5 mg by mouth as needed for anxiety.     Historical Provider, MD  alum & mag hydroxide-simeth (MAALOX/MYLANTA) 200-200-20 MG/5ML suspension Take 30 mLs by mouth every 6 (six) hours as needed for indigestion or heartburn. 03/22/15   Nishant Dhungel, MD  aspirin 81 MG EC tablet Take 81 mg by mouth daily.      Historical Provider, MD  azithromycin (ZITHROMAX Z-PAK) 250 MG tablet Take as directed on package. 11/26/15   [name], MD  Cholecalciferol (VITAMIN D) 1000 UNITS capsule Take 2,000 Units by mouth daily.     Historical Provider,  MD  losartan (COZAAR) 25 MG tablet Take 25 mg by mouth daily.    Historical Provider, MD  meclizine (ANTIVERT) 25 MG tablet Take 25 mg by mouth 3 (three) times daily as needed for dizziness.     Historical Provider, MD  ondansetron (ZOFRAN ODT) 4 MG disintegrating tablet Take 1 tablet (4 mg total) by mouth every 8 (eight) hours as needed for nausea. 08/28/12   Leonard Schwartz, MD  simvastatin (ZOCOR) 5 MG tablet Take 5 mg by mouth daily.    Historical Provider, MD   BP 184/84 mmHg  Pulse 62  Temp(Src) 97.6 F (36.4 C) (Oral)  Resp 20  Ht 5\' 9"  (1.753 m)  Wt 81.194 kg  BMI 26.42 kg/m2  SpO2 100% Physical Exam  Constitutional: She is oriented to person, place, and time. She appears well-developed and well-nourished. No distress.  HENT:  Head: Normocephalic and atraumatic.  Mouth/Throat: Oropharynx is clear and moist.  Eyes: Conjunctivae and EOM are normal. Pupils are equal, round, and reactive to light.  Neck: Normal range of motion. Neck supple.  Cardiovascular: Normal rate, regular rhythm and normal heart sounds.   No murmur heard. Pulmonary/Chest: Effort normal and breath sounds normal. No respiratory distress. She has no wheezes. She has no rales.  Bilateral rhonchi.  Abdominal: Soft. Bowel sounds are normal. There is no tenderness.  Musculoskeletal: Normal range of motion.  Neurological: She is alert and oriented to person, place, and time. No cranial nerve deficit. She exhibits normal muscle tone. Coordination normal.  Skin: Skin is warm. No rash noted.  Nursing note and vitals reviewed.   ED Course  Procedures (including critical care time) Labs Review Labs Reviewed - No data to display  Imaging Review Dg Chest 2 View  01/17/2016  CLINICAL DATA:  Cough and fever for 4 days EXAM: CHEST  2 VIEW COMPARISON:  03/21/2015 FINDINGS: Normal heart size. Clear lungs. Right mastectomy. Postop changes in the right axilla. No pneumothorax or pleural effusion. IMPRESSION: No active  cardiopulmonary disease. Electronically Signed   By: Marybelle Killings M.D.   On: 01/17/2016 16:09   I have personally reviewed and evaluated these images and lab results as part of my medical decision-making.   EKG Interpretation None      MDM   Final diagnoses:  URI (upper respiratory infection)  Bronchitis    Symptoms consistent with upper rest for infection with bronchitis components. Patient nontoxic no acute distress. Chest x-ray negative for pneumonia. Will treat symptomatically.    Fredia Sorrow, MD 01/17/16 1638  Fredia Sorrow, MD 01/17/16 [phone]

## 2016-01-22 ENCOUNTER — Encounter: Payer: Self-pay | Admitting: Hematology & Oncology

## 2016-04-28 ENCOUNTER — Other Ambulatory Visit (HOSPITAL_BASED_OUTPATIENT_CLINIC_OR_DEPARTMENT_OTHER): Payer: Medicare Other

## 2016-04-28 ENCOUNTER — Other Ambulatory Visit: Payer: Medicare Other

## 2016-04-28 ENCOUNTER — Encounter: Payer: Self-pay | Admitting: Hematology & Oncology

## 2016-04-28 ENCOUNTER — Ambulatory Visit (HOSPITAL_BASED_OUTPATIENT_CLINIC_OR_DEPARTMENT_OTHER): Payer: Medicare Other | Admitting: Hematology & Oncology

## 2016-04-28 ENCOUNTER — Ambulatory Visit (HOSPITAL_BASED_OUTPATIENT_CLINIC_OR_DEPARTMENT_OTHER): Payer: Medicare Other

## 2016-04-28 ENCOUNTER — Ambulatory Visit (HOSPITAL_BASED_OUTPATIENT_CLINIC_OR_DEPARTMENT_OTHER)
Admission: RE | Admit: 2016-04-28 | Discharge: 2016-04-28 | Disposition: A | Discharge status: Home / Self Care | Payer: Medicare Other | Source: Ambulatory Visit | Attending: Hematology & Oncology | Admitting: Hematology & Oncology

## 2016-04-28 VITALS — BP 187/68 | HR 54 | Temp 97.7°F | Resp 16 | Ht 69.0 in | Wt 190.0 lb

## 2016-04-28 DIAGNOSIS — R911 Solitary pulmonary nodule: Secondary | ICD-10-CM | POA: Diagnosis not present

## 2016-04-28 DIAGNOSIS — C50912 Malignant neoplasm of unspecified site of left female breast: Secondary | ICD-10-CM

## 2016-04-28 DIAGNOSIS — I2510 Atherosclerotic heart disease of native coronary artery without angina pectoris: Secondary | ICD-10-CM | POA: Insufficient documentation

## 2016-04-28 DIAGNOSIS — L03111 Cellulitis of right axilla: Secondary | ICD-10-CM

## 2016-04-28 DIAGNOSIS — M810 Age-related osteoporosis without current pathological fracture: Secondary | ICD-10-CM

## 2016-04-28 DIAGNOSIS — M818 Other osteoporosis without current pathological fracture: Secondary | ICD-10-CM | POA: Diagnosis not present

## 2016-04-28 DIAGNOSIS — Z853 Personal history of malignant neoplasm of breast: Secondary | ICD-10-CM

## 2016-04-28 DIAGNOSIS — C50911 Malignant neoplasm of unspecified site of right female breast: Secondary | ICD-10-CM

## 2016-04-28 DIAGNOSIS — T50905A Adverse effect of unspecified drugs, medicaments and biological substances, initial encounter: Principal | ICD-10-CM

## 2016-04-28 DIAGNOSIS — I700 Atherosclerosis of aorta: Secondary | ICD-10-CM | POA: Diagnosis not present

## 2016-04-28 DIAGNOSIS — C50011 Malignant neoplasm of nipple and areola, right female breast: Secondary | ICD-10-CM

## 2016-04-28 LAB — CMP (CANCER CENTER ONLY)
ALBUMIN: 4 g/dL (ref 3.3–5.5)
ALT(SGPT): 24 U/L (ref 10–47)
AST: 22 U/L (ref 11–38)
Alkaline Phosphatase: 48 U/L (ref 26–84)
BUN, Bld: 26 mg/dL — ABNORMAL HIGH (ref 7–22)
CHLORIDE: 106 meq/L (ref 98–108)
CO2: 25 meq/L (ref 18–33)
CREATININE: 1.3 mg/dL — AB (ref 0.6–1.2)
Calcium: 9.1 mg/dL (ref 8.0–10.3)
Glucose, Bld: 101 mg/dL (ref 73–118)
POTASSIUM: 4.5 meq/L (ref 3.3–4.7)
SODIUM: 142 meq/L (ref 128–145)
Total Bilirubin: 0.8 mg/dl (ref 0.20–1.60)
Total Protein: 6.9 g/dL (ref 6.4–8.1)

## 2016-04-28 LAB — CBC WITH DIFFERENTIAL (CANCER CENTER ONLY)
BASO#: 0 10*3/uL (ref 0.0–0.2)
BASO%: 0.5 % (ref 0.0–2.0)
EOS ABS: 0.1 10*3/uL (ref 0.0–0.5)
EOS%: 0.8 % (ref 0.0–7.0)
HCT: 39.7 % (ref 34.8–46.6)
HEMOGLOBIN: 12.9 g/dL (ref 11.6–15.9)
LYMPH#: 2.3 10*3/uL (ref 0.9–3.3)
LYMPH%: 30.7 % (ref 14.0–48.0)
MCH: 31 pg (ref 26.0–34.0)
MCHC: 32.5 g/dL (ref 32.0–36.0)
MCV: 95 fL (ref 81–101)
MONO#: 0.7 10*3/uL (ref 0.1–0.9)
MONO%: 8.7 % (ref 0.0–13.0)
NEUT%: 59.3 % (ref 39.6–80.0)
NEUTROS ABS: 4.4 10*3/uL (ref 1.5–6.5)
Platelets: 157 10*3/uL (ref 145–400)
RBC: 4.16 10*6/uL (ref 3.70–5.32)
RDW: 12.8 % (ref 11.1–15.7)
WBC: 7.5 10*3/uL (ref 3.9–10.0)

## 2016-04-28 MED ORDER — AZITHROMYCIN 250 MG PO TABS: ORAL_TABLET | ORAL | Status: DC

## 2016-04-28 MED ORDER — ZOLEDRONIC ACID 4 MG/100ML IV SOLN
4.0000 mg | Freq: Once | INTRAVENOUS | Status: AC
  Administered 2016-04-28: 4 mg via INTRAVENOUS
  Filled 2016-04-28: qty 100

## 2016-04-28 MED FILL — AZITHROMYCIN 250 MG TABLET: 250 | 5 days supply | Qty: 6 | Fill #0

## 2016-04-28 NOTE — Progress Notes (Signed)
Hematology and Oncology Follow Up Visit  Jennifer Yang [id number] 01/30/1941 75 y.o. 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   ER(+)  Current Therapy:    observation     Interim History:  Ms.  Jennifer Yang is in for her follow-up. She is doing well. We did go ahead and get a CT scan on her. This was because of a nodule noted in her right lung. It was 2 mm. The CT scan was done today. The nodule has not grown in size. As such, the radiologist feels that this is a benign nodule.  Otherwise, she is doing okay. She has had no problems with nausea or vomiting. There is no cough. She's had no bleeding. There is no change in bowel or bladder habits. She has had no rashes. She has had no leg swelling.   Overall, her performance status is ECOG 0.   So far, she and her family do not have any big plans for vacation this summer.   Medications:  Current outpatient prescriptions:  .  ALPRAZolam (XANAX) 0.5 MG tablet, Take 0.5 mg by mouth as needed for anxiety. , Disp: , Rfl:  .  alum & mag hydroxide-simeth (MAALOX/MYLANTA) 200-200-20 MG/5ML suspension, Take 30 mLs by mouth every 6 (six) hours as needed for indigestion or heartburn., Disp: 355 mL, Rfl: 0 .  aspirin 81 MG EC tablet, Take 81 mg by mouth daily.  , Disp: , Rfl:  .  azithromycin (ZITHROMAX Z-PAK) 250 MG tablet, Take as directed on package., Disp: 6 each, Rfl: 3 .  Calcium Carbonate-Vitamin D (CALTRATE 600+D PO), Take by mouth., Disp: , Rfl:  .  Cholecalciferol (VITAMIN D) 1000 UNITS capsule, Take 2,000 Units by mouth daily. , Disp: , Rfl:  .  ciprofloxacin (CIPRO) 500 MG tablet, , Disp: , Rfl: 0 .  losartan (COZAAR) 25 MG tablet, Take 25 mg by mouth daily., Disp: , Rfl:  .  meclizine (ANTIVERT) 25 MG tablet, Take 25 mg by mouth 3 (three) times daily as needed for dizziness. , Disp: , Rfl:  .  Naproxen Sodium (ALEVE) 220  MG CAPS, Take by mouth., Disp: , Rfl:  .  ondansetron (ZOFRAN ODT) 4 MG disintegrating tablet, Take 1 tablet (4 mg total) by mouth every 8 (eight) hours as needed for nausea., Disp: 20 tablet, Rfl: 0 .  simvastatin (ZOCOR) 5 MG tablet, Take 5 mg by mouth daily., Disp: , Rfl:   Allergies:  Allergies  Allergen Reactions  . Codeine Sulfate Swelling  . Fish-Derived Products Other (See Comments)    Cellulitis   . Rocephin [Ceftriaxone Sodium] Other (See Comments)    Low blood count  . Sulfa Antibiotics Other (See Comments)    Mouth and lip sores    Past Medical History, Surgical history, Social history, and Family History were reviewed and updated.  Review of Systems: As above  Physical Exam:  height is 5\' 9"  (1.753 m) and weight is 190 lb (86.183 kg). Her oral temperature is 97.7 F (36.5 C). Her blood pressure is 187/68 and her pulse is 54. Her respiration is 16.   Well-developed and well-nourished white female. Her head and neck exam is nausea or oral lesion. She is no palpable cervical or supraclavicular lymph nodes. Lungs are clear a lot. Cardiac exam regular rate and rhythm with a normal S1 and S2. Breast exam  shows left breast with no masses edema or erythema. There is no left axillary adenopathy. Right chest wall shows a well-healed mastectomy.there is no nodule. There is no erythema. There is no right axillary adenopathy. Abdomen soft. Has good bowel sounds. There is no fluid wave. No palpable liver or spleen tip. Back exam shows no tenderness over the spine ribs or hips. There is no kyphosis. Extremities shows no clubbing cyanosis or edema. Maybe some slight lymphedema of the right arm. She's good range of motion of the joints. Skin exam shows a suspicious hyperpigmented lesion on the back. This is in the mid back. This is just to the right of the spine. Neurological exam is nonfocal.  Lab Results  Component Value Date   WBC 7.5 04/28/2016   HGB 12.9 04/28/2016   HCT 39.7  04/28/2016   MCV 95 04/28/2016   PLT 157 04/28/2016     Chemistry      Component Value Date/Time   NA 142 04/28/2016 0748   NA 141 03/21/2015 1243   NA 144 05/24/2013 0959   K 4.5 04/28/2016 0748   K 3.5 03/21/2015 1243   K 3.9 05/24/2013 0959   CL 106 04/28/2016 0748   CL 106 03/21/2015 1243   CO2 25 04/28/2016 0748   CO2 25 03/21/2015 1239   CO2 27 05/24/2013 0959   BUN 26* 04/28/2016 0748   BUN 12 03/21/2015 1243   BUN 19.7 05/24/2013 0959   CREATININE 1.3* 04/28/2016 0748   CREATININE 0.90 03/21/2015 1243   CREATININE 1.1 05/24/2013 0959      Component Value Date/Time   CALCIUM 9.1 04/28/2016 0748   CALCIUM 8.7* 03/21/2015 1239   CALCIUM 9.8 05/24/2013 0959   ALKPHOS 48 04/28/2016 0748   ALKPHOS 38* 05/27/2012 0907   AST 22 04/28/2016 0748   AST 15 05/27/2012 0907   ALT 24 04/28/2016 0748   ALT 14 05/27/2012 0907   BILITOT 0.80 04/28/2016 0748   BILITOT 0.6 05/27/2012 0907         Impression and Plan: Ms. Jennifer Yang is 75 year old white female with multiple right breast cancers. She's had 2 separate primaries. They're both stage I. They're both  ER positive. She underwent  a mastectomy.  I feel good about the CT scan. As such, I don't think we have to repeat a CT scan on her. This nodule is stable.  She will get her Zometa today. This is for osteoporosis. In addition, also helps with decreased recurrence of early stage breast cancer.  I'll plan to get her back in 6 more months.    [name], MD 6/12/201711:09 AM

## 2016-04-28 NOTE — Patient Instructions (Signed)

## 2016-04-29 LAB — VITAMIN D 25 HYDROXY (VIT D DEFICIENCY, FRACTURES): Vitamin D, 25-Hydroxy: 37 ng/mL (ref 30.0–100.0)

## 2016-09-03 ENCOUNTER — Encounter (HOSPITAL_COMMUNITY): Payer: Self-pay | Admitting: Emergency Medicine

## 2016-09-03 ENCOUNTER — Emergency Department (HOSPITAL_COMMUNITY)
Admission: EM | Admit: 2016-09-03 | Discharge: 2016-09-03 | Disposition: A | Discharge status: Home / Self Care | Payer: Medicare Other | Attending: Emergency Medicine | Admitting: Emergency Medicine

## 2016-09-03 DIAGNOSIS — Z7982 Long term (current) use of aspirin: Secondary | ICD-10-CM | POA: Diagnosis not present

## 2016-09-03 DIAGNOSIS — M25551 Pain in right hip: Secondary | ICD-10-CM | POA: Diagnosis present

## 2016-09-03 DIAGNOSIS — N3001 Acute cystitis with hematuria: Secondary | ICD-10-CM

## 2016-09-03 DIAGNOSIS — I10 Essential (primary) hypertension: Secondary | ICD-10-CM | POA: Insufficient documentation

## 2016-09-03 DIAGNOSIS — Z79899 Other long term (current) drug therapy: Secondary | ICD-10-CM | POA: Insufficient documentation

## 2016-09-03 DIAGNOSIS — Z853 Personal history of malignant neoplasm of breast: Secondary | ICD-10-CM | POA: Insufficient documentation

## 2016-09-03 LAB — CBC WITH DIFFERENTIAL/PLATELET
BASOS ABS: 0 10*3/uL (ref 0.0–0.1)
BASOS PCT: 0 %
EOS ABS: 0 10*3/uL (ref 0.0–0.7)
Eosinophils Relative: 0 %
HEMATOCRIT: 41.4 % (ref 36.0–46.0)
HEMOGLOBIN: 13.3 g/dL (ref 12.0–15.0)
Lymphocytes Relative: 9 %
Lymphs Abs: 1 10*3/uL (ref 0.7–4.0)
MCH: 30 pg (ref 26.0–34.0)
MCHC: 32.1 g/dL (ref 30.0–36.0)
MCV: 93.5 fL (ref 78.0–100.0)
MONOS PCT: 4 %
Monocytes Absolute: 0.5 10*3/uL (ref 0.1–1.0)
NEUTROS ABS: 9.5 10*3/uL — AB (ref 1.7–7.7)
NEUTROS PCT: 87 %
Platelets: 184 10*3/uL (ref 150–400)
RBC: 4.43 MIL/uL (ref 3.87–5.11)
RDW: 13.3 % (ref 11.5–15.5)
WBC: 11 10*3/uL — AB (ref 4.0–10.5)

## 2016-09-03 LAB — URINALYSIS, ROUTINE W REFLEX MICROSCOPIC
BILIRUBIN URINE: NEGATIVE
Glucose, UA: NEGATIVE mg/dL
KETONES UR: NEGATIVE mg/dL
NITRITE: POSITIVE — AB
Protein, ur: NEGATIVE mg/dL
SPECIFIC GRAVITY, URINE: 1.026 (ref 1.005–1.030)
pH: 5.5 (ref 5.0–8.0)

## 2016-09-03 LAB — BASIC METABOLIC PANEL
ANION GAP: 8 (ref 5–15)
BUN: 29 mg/dL — ABNORMAL HIGH (ref 6–20)
CALCIUM: 9.1 mg/dL (ref 8.9–10.3)
CO2: 26 mmol/L (ref 22–32)
CREATININE: 1.19 mg/dL — AB (ref 0.44–1.00)
Chloride: 104 mmol/L (ref 101–111)
GFR, EST AFRICAN AMERICAN: 50 mL/min — AB (ref >60)
GFR, EST NON AFRICAN AMERICAN: 44 mL/min — AB (ref >60)
Glucose, Bld: 124 mg/dL — ABNORMAL HIGH (ref 65–99)
Potassium: 4.3 mmol/L (ref 3.5–5.1)
SODIUM: 138 mmol/L (ref 135–145)

## 2016-09-03 LAB — URINE MICROSCOPIC-ADD ON

## 2016-09-03 MED ORDER — NAPROXEN 375 MG PO TABS: 375.0000 mg | ORAL_TABLET | Freq: Two times a day (BID) | ORAL | 0 refills | Status: DC

## 2016-09-03 MED ORDER — ONDANSETRON HCL 4 MG/2ML IJ SOLN
4.0000 mg | Freq: Once | INTRAMUSCULAR | Status: AC
  Administered 2016-09-03: 4 mg via INTRAVENOUS
  Filled 2016-09-03: qty 2

## 2016-09-03 MED ORDER — METHYLPREDNISOLONE SODIUM SUCC 125 MG IJ SOLR
125.0000 mg | Freq: Once | INTRAMUSCULAR | Status: AC
  Administered 2016-09-03: 125 mg via INTRAVENOUS
  Filled 2016-09-03: qty 2

## 2016-09-03 MED ORDER — MORPHINE SULFATE (PF) 4 MG/ML IV SOLN
4.0000 mg | Freq: Once | INTRAVENOUS | Status: AC
  Administered 2016-09-03: 4 mg via INTRAVENOUS
  Filled 2016-09-03: qty 1

## 2016-09-03 MED ORDER — CEPHALEXIN 500 MG PO CAPS: 500.0000 mg | ORAL_CAPSULE | Freq: Two times a day (BID) | ORAL | 0 refills | Status: DC

## 2016-09-03 MED ORDER — DIAZEPAM 5 MG/ML IJ SOLN
5.0000 mg | Freq: Once | INTRAMUSCULAR | Status: AC
Start: 2016-09-03 — End: 2016-09-03
  Administered 2016-09-03: 5 mg via INTRAVENOUS
  Filled 2016-09-03: qty 2

## 2016-09-03 NOTE — ED Notes (Signed)
Bed: [id number] Expected date:  Expected time:  Means of arrival:  Comments: EMS-hip  pain

## 2016-09-03 NOTE — ED Notes (Signed)
Pt just had IV medication -  Pt and RN aware that I will be back in one hour to collect labs.  (  IV is in her hand and the other arm is restricted. )

## 2016-09-03 NOTE — ED Triage Notes (Signed)
Per EMS-states has a history of bursitis-saw ortho on Monday for injection-states it has not helped-was given 100 mcg of Fentanyl and 8 mg of Zofran in route-states 10/10 pain down to 7/10

## 2016-09-03 NOTE — ED Provider Notes (Signed)
[facility] DEPT Provider Note   CSN: [id number] Arrival date & time: 09/03/16  1041     History   Chief Complaint Chief Complaint  Patient presents with  . Hip Pain    HPI Jennifer Yang is a 75 y.o. female who presents with right hip pain. His problem is chronic. She sees Dr. Alvan Dame and received an injection for her hip bursitis 2 weeks ago. Patient has also been going to physical therapy for this problem. Patient also spoke Dr. Delilah Shan 2 days ago for a cortisone injection for a probable bulging disc in her spine. Patient denies any back pain. Patient has had worsening pain over the past couple days. Patient has had associated nausea due to pain. She is also had difficulty ambulating due to pain. She feels that her right side is weaker due to the pain. Patient has been taking Aleve without relief. Patient denies any fevers, chest pain, shortness of breath, abdominal pain, vomiting, urinary symptoms.  HPI  Past Medical History:  Diagnosis Date  . Allergy    shell fish  . Anxiety    takes xanax on a rare occasion  . Arthritis    knees  . Benign essential HTN 11/24/2011  . Bladder infection 03/18/15   completed antibotics   . Breast cancer, right breast ([place]) 02/16/2008  . Breast cancer, stage 1 ([place]) 07/16/1990  . Cancer [facility])    right Chemo/radiation '01/radical '09  . Contact lens/glasses fitting   . 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 Family history of breast cancer    mother  . Hyperlipidemia   . Hypertension   . Lymphedema of arm 11/24/2011   right arm  . MVP (mitral valve prolapse)   . PONV (postoperative nausea and vomiting)    has vertigo also-would like scop patch  . Thyroid disease   . Vertigo     Patient Active Problem List   Diagnosis Date Noted  . Solitary pulmonary nodule 03/22/2015  . Gaseous abdominal distention 03/22/2015  . Near syncope 03/21/2015  . Orthostatic hypotension 03/21/2015  . Dyslipidemia 03/21/2015  . Lipoma of neck 07/28/2012  .  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 Lymphedema of arm 11/24/2011  . 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 Benign essential HTN 11/24/2011  . Cellulitis of arm, right 11/24/2011  . Breast cancer, right breast ([place]) 02/16/2008  . Breast cancer, stage 1 ([place]) 07/16/1990    Past Surgical History:  Procedure Laterality Date  . BREAST LUMPECTOMY     2001 right  . COLONOSCOPY  2006   w/Dr.Orr  . FOOT AMPUTATION     right hammer toe  . FOOT NEUROMA SURGERY     left  . KNEE ARTHROSCOPY     left  . MASS EXCISION  07/21/2012   Procedure: EXCISION MASS;  Surgeon: Adin [place], MD;  Location: [facility];  Service: General;  Laterality: N/A;  excision 15cm posterior neck mass posterior  . MASTECTOMY, RADICAL     2009 right  . THYROIDECTOMY, PARTIAL    . UPPER GASTROINTESTINAL ENDOSCOPY      OB History    No data available       Home Medications    Prior to Admission medications   Medication Sig Start Date End Date Taking? Authorizing Provider  ALPRAZolam Duanne Moron) 0.5 MG tablet Take 0.5 mg by mouth as needed for anxiety.    Yes Historical Provider, MD  alum & mag hydroxide-simeth (MAALOX/MYLANTA) 200-200-20 MG/5ML suspension Take 30 mLs by mouth every 6 (  six) hours as needed for indigestion or heartburn. 03/22/15  Yes Nishant Dhungel, MD  aspirin 81 MG EC tablet Take 81 mg by mouth at bedtime.    Yes Historical Provider, MD  Calcium Carbonate-Vitamin D (CALTRATE 600+D PO) Take 1 tablet by mouth daily.    Yes Historical Provider, MD  Cholecalciferol (VITAMIN D) 1000 UNITS capsule Take 2,000 Units by mouth daily.    Yes Historical Provider, MD  fluticasone (FLONASE) 50 MCG/ACT nasal spray Place 1-2 sprays into both nostrils daily as needed for allergies or rhinitis.   Yes Historical Provider, MD  losartan (COZAAR) 25 MG tablet Take 25 mg by mouth daily.   Yes Historical Provider, MD  meclizine (ANTIVERT) 25 MG tablet Take 25 mg by mouth 3 (three) times daily as needed for  dizziness.    Yes Historical Provider, MD  Naproxen Sodium (ALEVE) 220 MG CAPS Take 220 mg by mouth 3 (three) times daily as needed (pain).    Yes Historical Provider, MD  azithromycin (ZITHROMAX Z-PAK) 250 MG tablet Take as directed on package. Patient not taking: Reported on 09/03/2016 04/28/16   [name], MD  cephALEXin (KEFLEX) 500 MG capsule Take 1 capsule (500 mg total) by mouth 2 (two) times daily. 09/03/16   Frederica Kuster, PA-C  naproxen (NAPROSYN) 375 MG tablet Take 1 tablet (375 mg total) by mouth 2 (two) times daily. 09/03/16   Frederica Kuster, PA-C  rosuvastatin (CRESTOR) 5 MG tablet Take 5 mg by mouth daily. 09/01/16   Historical Provider, MD    Family History Family History  Problem Relation Age of Onset  . Cancer Mother   . Heart disease Mother   . Heart disease Father   . Heart disease Brother   . Colon cancer Neg Hx     Social History Social History  Substance Use Topics  . Smoking status: Never Smoker  . Smokeless tobacco: Never Used     Comment: NEVER USED TOBACCO  . Alcohol use No     Allergies   Codeine sulfate; Fish-derived products; Rocephin [ceftriaxone sodium]; and Sulfa antibiotics   Review of Systems Review of Systems  Constitutional: Negative for chills and fever.  HENT: Negative for facial swelling and sore throat.   Respiratory: Negative for shortness of breath.   Cardiovascular: Negative for chest pain.  Gastrointestinal: Negative for abdominal pain, nausea and vomiting.  Genitourinary: Negative for dysuria.  Musculoskeletal: Positive for arthralgias. Negative for back pain.  Skin: Negative for rash and wound.  Neurological: Negative for headaches.  Psychiatric/Behavioral: The patient is not nervous/anxious.      Physical Exam Updated Vital Signs BP 148/65   Pulse 61   Temp 97.5 F (36.4 C)   Resp 16   SpO2 98%   Physical Exam  Constitutional: She appears well-developed and well-nourished. No distress.  HENT:  Head:  Normocephalic and atraumatic.  Mouth/Throat: Oropharynx is clear and moist. No oropharyngeal exudate.  Eyes: Conjunctivae are normal. Pupils are equal, round, and reactive to light. Right eye exhibits no discharge. Left eye exhibits no discharge. No scleral icterus.  Neck: Normal range of motion. Neck supple. No thyromegaly present.  Cardiovascular: Normal rate, regular rhythm, normal heart sounds and intact distal pulses.  Exam reveals no gallop and no friction rub.   No murmur heard. Pulmonary/Chest: Effort normal and breath sounds normal. No stridor. No respiratory distress. She has no wheezes. She has no rales.  Abdominal: Soft. Bowel sounds are normal. She exhibits no distension. There is  no tenderness. There is no rebound and no guarding.  Musculoskeletal: She exhibits no edema.       Lumbar back: She exhibits no tenderness and no bony tenderness.       Legs: Normal sensation to lower extremities; 5/5 strength lower extremities  Lymphadenopathy:    She has no cervical adenopathy.  Neurological: She is alert. Coordination normal.  Skin: Skin is warm and dry. No rash noted. She is not diaphoretic. No pallor.  Psychiatric: She has a normal mood and affect.  Nursing note and vitals reviewed.    ED Treatments / Results  Labs (all labs ordered are listed, but only abnormal results are displayed) Labs Reviewed  BASIC METABOLIC PANEL - Abnormal; Notable for the following:       Result Value   Glucose, Bld 124 (*)    BUN 29 (*)    Creatinine, Ser 1.19 (*)    GFR calc non Af Amer 44 (*)    GFR calc Af Amer 50 (*)    All other components within normal limits  CBC WITH DIFFERENTIAL/PLATELET - Abnormal; Notable for the following:    WBC 11.0 (*)    Neutro Abs 9.5 (*)    All other components within normal limits  URINALYSIS, ROUTINE W REFLEX MICROSCOPIC (NOT AT [facility]) - Abnormal; Notable for the following:    APPearance CLOUDY (*)    Hgb urine dipstick TRACE (*)    Nitrite POSITIVE (*)     Leukocytes, UA SMALL (*)    All other components within normal limits  URINE MICROSCOPIC-ADD ON - Abnormal; Notable for the following:    Squamous Epithelial / LPF 6-30 (*)    Bacteria, UA MANY (*)    All other components within normal limits    EKG  EKG Interpretation None       Radiology No results found.  Procedures Procedures (including critical care time)  Medications Ordered in ED Medications  ondansetron (ZOFRAN) injection 4 mg (4 mg Intravenous Given 09/03/16 1203)  morphine 4 MG/ML injection 4 mg (4 mg Intravenous Given 09/03/16 1210)  methylPREDNISolone sodium succinate (SOLU-MEDROL) 125 mg/2 mL injection 125 mg (125 mg Intravenous Given 09/03/16 1353)  diazepam (VALIUM) injection 5 mg (5 mg Intravenous Given 09/03/16 1345)     Initial Impression / Assessment and Plan / ED Course  I have reviewed the triage vital signs and the nursing notes.  Pertinent labs & imaging results that were available during my care of the patient were reviewed by me and considered in my medical decision making (see chart for details).  Clinical Course    CBC shows WBC 11.0. BMP shows stable creatinine 1.19, BUN 29. UA shows positive nitrites, trace hematuria, small leukocytes, many bacteria. Patient with chronic hip pain, most likely bursitis. Patient also with urinary tract infection. Patient feeling much better after Solu-Medrol, Valium. We will treat UTI with Keflex. Patient to follow-up with orthopedic doctor. Supportive treatment discussed. Discharged with Naprosyn. Patient understands and agrees with plan. Return precautions discussed. Patient discharged in satisfactory condition.  Final Clinical Impressions(s) / ED Diagnoses   Final diagnoses:  Right hip pain  Acute cystitis with hematuria    New Prescriptions Discharge Medication List as of 09/03/2016  4:06 PM    START taking these medications   Details  cephALEXin (KEFLEX) 500 MG capsule Take 1 capsule (500 mg  total) by mouth 2 (two) times daily., Starting Wed 09/03/2016, Print    naproxen (NAPROSYN) 375 MG tablet Take 1 tablet (375  mg total) by mouth 2 (two) times daily., Starting Wed 09/03/2016, Print         Frederica Kuster, PA-C 09/03/16 2106    Isla Pence, MD 09/04/16 1320

## 2016-09-03 NOTE — Discharge Instructions (Signed)
Medications: Keflex, Naprosyn  Treatment: Take Keflex as prescribed for 1 week. Make sure to finish all this medicine. Take Naprosyn OR Aleve twice daily. Use ice 3-4 times daily alternating 20 minutes on, 20 minutes off. You can take the prednisone prescribed by your doctor, but do not take until tomorrow.  Follow-up: Please follow-up with Dr. Alvan Dame for further evaluation and treatment of your symptoms. Please return to emergency department if you develop any new or worsening symptoms.

## 2016-09-09 ENCOUNTER — Other Ambulatory Visit: Payer: Self-pay | Admitting: Family

## 2016-10-27 ENCOUNTER — Encounter: Payer: Self-pay | Admitting: Family

## 2016-10-27 ENCOUNTER — Ambulatory Visit (HOSPITAL_BASED_OUTPATIENT_CLINIC_OR_DEPARTMENT_OTHER): Payer: Medicare Other | Admitting: Family

## 2016-10-27 ENCOUNTER — Other Ambulatory Visit (HOSPITAL_BASED_OUTPATIENT_CLINIC_OR_DEPARTMENT_OTHER): Payer: Medicare Other

## 2016-10-27 VITALS — BP 190/73 | HR 54 | Temp 97.6°F | Resp 16 | Ht 69.0 in | Wt 192.0 lb

## 2016-10-27 DIAGNOSIS — Z170 Estrogen receptor positive status [ER+]: Principal | ICD-10-CM

## 2016-10-27 DIAGNOSIS — T50905A Adverse effect of unspecified drugs, medicaments and biological substances, initial encounter: Secondary | ICD-10-CM

## 2016-10-27 DIAGNOSIS — C50911 Malignant neoplasm of unspecified site of right female breast: Secondary | ICD-10-CM

## 2016-10-27 DIAGNOSIS — J0110 Acute frontal sinusitis, unspecified: Secondary | ICD-10-CM

## 2016-10-27 DIAGNOSIS — C50912 Malignant neoplasm of unspecified site of left female breast: Secondary | ICD-10-CM

## 2016-10-27 DIAGNOSIS — Z853 Personal history of malignant neoplasm of breast: Secondary | ICD-10-CM

## 2016-10-27 DIAGNOSIS — J329 Chronic sinusitis, unspecified: Secondary | ICD-10-CM

## 2016-10-27 DIAGNOSIS — L03111 Cellulitis of right axilla: Secondary | ICD-10-CM

## 2016-10-27 DIAGNOSIS — M818 Other osteoporosis without current pathological fracture: Secondary | ICD-10-CM

## 2016-10-27 DIAGNOSIS — R911 Solitary pulmonary nodule: Secondary | ICD-10-CM

## 2016-10-27 LAB — CBC WITH DIFFERENTIAL (CANCER CENTER ONLY)
BASO#: 0 10*3/uL (ref 0.0–0.2)
BASO%: 0.3 % (ref 0.0–2.0)
EOS%: 1.6 % (ref 0.0–7.0)
Eosinophils Absolute: 0.1 10*3/uL (ref 0.0–0.5)
HEMATOCRIT: 38.5 % (ref 34.8–46.6)
HEMOGLOBIN: 12.6 g/dL (ref 11.6–15.9)
LYMPH#: 2 10*3/uL (ref 0.9–3.3)
LYMPH%: 28.8 % (ref 14.0–48.0)
MCH: 30.7 pg (ref 26.0–34.0)
MCHC: 32.7 g/dL (ref 32.0–36.0)
MCV: 94 fL (ref 81–101)
MONO#: 0.6 10*3/uL (ref 0.1–0.9)
MONO%: 8 % (ref 0.0–13.0)
NEUT%: 61.3 % (ref 39.6–80.0)
NEUTROS ABS: 4.2 10*3/uL (ref 1.5–6.5)
Platelets: 162 10*3/uL (ref 145–400)
RBC: 4.11 10*6/uL (ref 3.70–5.32)
RDW: 13.1 % (ref 11.1–15.7)
WBC: 6.9 10*3/uL (ref 3.9–10.0)

## 2016-10-27 LAB — CMP (CANCER CENTER ONLY)
ALBUMIN: 3.5 g/dL (ref 3.3–5.5)
ALK PHOS: 35 U/L (ref 26–84)
ALT: 25 U/L (ref 10–47)
AST: 27 U/L (ref 11–38)
BILIRUBIN TOTAL: 0.7 mg/dL (ref 0.20–1.60)
BUN, Bld: 18 mg/dL (ref 7–22)
CALCIUM: 10.1 mg/dL (ref 8.0–10.3)
CO2: 30 meq/L (ref 18–33)
Chloride: 101 mEq/L (ref 98–108)
Creat: 1.2 mg/dl (ref 0.6–1.2)
Glucose, Bld: 107 mg/dL (ref 73–118)
POTASSIUM: 4.4 meq/L (ref 3.3–4.7)
Sodium: 144 mEq/L (ref 128–145)
Total Protein: 6.5 g/dL (ref 6.4–8.1)

## 2016-10-27 MED ORDER — AZITHROMYCIN 250 MG PO TABS: ORAL_TABLET | ORAL | 0 refills | Status: DC

## 2016-10-27 MED FILL — AZITHROMYCIN 250 MG TABLET: 250 | 5 days supply | Qty: 6 | Fill #0

## 2016-10-27 NOTE — Progress Notes (Signed)
Hematology and Oncology Follow Up Visit  Jennifer Yang [id number] November 05, 1941 75 y.o. 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 ER(+)  Current Therapy:   Zometa once a year     Interim History:  Jennifer Yang is here today with her husband for a follow-up. She is doing fairly well but has symptoms of an acute sinusitis. She has a runny nose, sore throat and dry cough.  Breast exam today was negative. Right chest mastectomy site dry and intact. No changes with the left breast. Mammogram in March of this year was negative.  She wraps her right arm each night. This has significantly reduced the lymphedema in her arm.  She was a bit hypertensive when she first go to our office. Now that she has been here for a bit I rechecked manually and she is 126/84. She admits that she stresses when she comes to these appointments.  No fever, chills, n/v, rash, dizziness, SOB, chest pain, palpitations, abdominal pain, or changes in bowel or bladder habits.  No episodes of bleeding or bruising. No lymphadenopathy found on exam.  No swelling, numbness or tingling in her extremities. She has chronic pain due to bursitis and arthritis in her hips, lower back and knees. She states that she received an epidural injection in her back 2 weeks ago and steroid injection to each knee last week.  She has a hand out from when she went to PT and does exercises each day.   She is eating healthy and staying well hydrated. Her weight is stable.    Medications:    Medication List       Accurate as of 10/27/16  9:38 AM. Always use your most recent med list.          ALEVE 220 MG Caps Generic drug:  Naproxen Sodium Take 220 mg by mouth 3 (three) times daily as needed (pain).   ALPRAZolam 0.5 MG tablet Commonly known as:  XANAX Take 0.5 mg by mouth as needed for anxiety.   alum & mag  hydroxide-simeth 200-200-20 MG/5ML suspension Commonly known as:  MAALOX/MYLANTA Take 30 mLs by mouth every 6 (six) hours as needed for indigestion or heartburn.   aspirin 81 MG EC tablet Take 81 mg by mouth at bedtime.   azithromycin 250 MG tablet Commonly known as:  ZITHROMAX Z-PAK Take as directed on package.   CALTRATE 600+D PO Take 1 tablet by mouth daily.   cephALEXin 500 MG capsule Commonly known as:  KEFLEX Take 1 capsule (500 mg total) by mouth 2 (two) times daily.   fluticasone 50 MCG/ACT nasal spray Commonly known as:  FLONASE Place 1-2 sprays into both nostrils daily as needed for allergies or rhinitis.   losartan 25 MG tablet Commonly known as:  COZAAR Take 25 mg by mouth daily.   meclizine 25 MG tablet Commonly known as:  ANTIVERT Take 25 mg by mouth 3 (three) times daily as needed for dizziness.   naproxen 375 MG tablet Commonly known as:  NAPROSYN Take 1 tablet (375 mg total) by mouth 2 (two) times daily.   rosuvastatin 5 MG tablet Commonly known as:  CRESTOR Take 5 mg by mouth daily.   Vitamin D 1000 units capsule Take 2,000 Units by mouth daily.       Allergies:  Allergies  Allergen Reactions  . Codeine Sulfate Swelling  . Fish-Derived  Products Other (See Comments)    Cellulitis   . Rocephin [Ceftriaxone Sodium] Other (See Comments)    Low blood count  . Sulfa Antibiotics Other (See Comments)    Mouth and lip sores    Past Medical History, Surgical history, Social history, and Family History were reviewed and updated.  Review of Systems: All other 10 point review of systems is negative.   Physical Exam:  vitals were not taken for this visit.  Wt Readings from Last 3 Encounters:  04/28/16 190 lb (86.2 kg)  01/17/16 179 lb (81.2 kg)  11/26/15 183 lb (83 kg)    Ocular: Sclerae unicteric, pupils equal, round and reactive to light Ear-nose-throat: Oropharynx clear, dentition fair, back of throat is red, no discharge Lymphatic: No  cervical or supraclavicular adenopathy Lungs clear throughout bilaterally, no rales or rhonchi, good excursion bilaterally Heart regular rate and rhythm, no murmur appreciated Abd soft, nontender, positive bowel sounds, no liver or spleen tip palpated on exam, no fluid wave MSK no focal spinal tenderness, no joint edema Neuro: non-focal, well-oriented, appropriate affect Breasts: No changes. No mass, lesion, rash or lymphadenopathy.   Lab Results  Component Value Date   WBC 11.0 (H) 09/03/2016   HGB 13.3 09/03/2016   HCT 41.4 09/03/2016   MCV 93.5 09/03/2016   PLT 184 09/03/2016   No results found for: FERRITIN, IRON, TIBC, UIBC, IRONPCTSAT Lab Results  Component Value Date   RBC 4.43 09/03/2016   No results found for: KPAFRELGTCHN, LAMBDASER, KAPLAMBRATIO No results found for: Kandis Cocking, IGMSERUM No results found for: Odetta Pink, SPEI   Chemistry      Component Value Date/Time   NA 138 09/03/2016 1512   NA 142 04/28/2016 0748   NA 144 05/24/2013 0959   K 4.3 09/03/2016 1512   K 4.5 04/28/2016 0748   K 3.9 05/24/2013 0959   CL 104 09/03/2016 1512   CL 106 04/28/2016 0748   CO2 26 09/03/2016 1512   CO2 25 04/28/2016 0748   CO2 27 05/24/2013 0959   BUN 29 (H) 09/03/2016 1512   BUN 26 (H) 04/28/2016 0748   BUN 19.7 05/24/2013 0959   CREATININE 1.19 (H) 09/03/2016 1512   CREATININE 1.3 (H) 04/28/2016 0748   CREATININE 1.1 05/24/2013 0959      Component Value Date/Time   CALCIUM 9.1 09/03/2016 1512   CALCIUM 9.1 04/28/2016 0748   CALCIUM 9.8 05/24/2013 0959   ALKPHOS 48 04/28/2016 0748   AST 22 04/28/2016 0748   ALT 24 04/28/2016 0748   BILITOT 0.80 04/28/2016 0748     Impression and Plan: Jennifer Yang is a pleasant 75 yo white female with multiple right breast cancers. She's had 2 separate primaries both stage I and ER positive. She did undergo a right mastectomy. She is doing well and so far, there has been  no evidence of recurrence.  She has a sinus infection and is symptomatic with sore throat, dry cough and runny nose/congestion. We will have her start a z-pack today. Prescription sent to pharmacy downstairs.  Se will be due for another mammogram in March 2018. Exam today was negative.  Yearly Zometa is due again at next appointment.  We will plan to see her back in 6 months for labs and follow-up.  She knows to call here with any questions or concerns. We can certainly see her sooner if need be.    Eliezer Bottom, NP 12/11/20179:38 AM

## 2016-10-28 ENCOUNTER — Telehealth: Payer: Self-pay | Admitting: *Deleted

## 2016-10-28 LAB — VITAMIN D 25 HYDROXY (VIT D DEFICIENCY, FRACTURES): Vitamin D, 25-Hydroxy: 41.1 ng/mL (ref 30.0–100.0)

## 2016-10-28 NOTE — Telephone Encounter (Addendum)
Patient aware of results  ----- Message from [name], MD sent at 10/28/2016  6:50 AM EST ----- Call - vit D level is perfect!!  pete

## 2016-11-15 ENCOUNTER — Emergency Department (HOSPITAL_BASED_OUTPATIENT_CLINIC_OR_DEPARTMENT_OTHER)
Admission: EM | Admit: 2016-11-15 | Discharge: 2016-11-15 | Disposition: A | Discharge status: Home / Self Care | Payer: Medicare Other | Attending: Emergency Medicine | Admitting: Emergency Medicine

## 2016-11-15 ENCOUNTER — Encounter (HOSPITAL_BASED_OUTPATIENT_CLINIC_OR_DEPARTMENT_OTHER): Payer: Self-pay | Admitting: *Deleted

## 2016-11-15 DIAGNOSIS — I10 Essential (primary) hypertension: Secondary | ICD-10-CM | POA: Insufficient documentation

## 2016-11-15 DIAGNOSIS — Z853 Personal history of malignant neoplasm of breast: Secondary | ICD-10-CM | POA: Diagnosis not present

## 2016-11-15 DIAGNOSIS — L509 Urticaria, unspecified: Secondary | ICD-10-CM | POA: Insufficient documentation

## 2016-11-15 DIAGNOSIS — Z7982 Long term (current) use of aspirin: Secondary | ICD-10-CM | POA: Insufficient documentation

## 2016-11-15 DIAGNOSIS — R21 Rash and other nonspecific skin eruption: Secondary | ICD-10-CM | POA: Diagnosis present

## 2016-11-15 DIAGNOSIS — Z79899 Other long term (current) drug therapy: Secondary | ICD-10-CM | POA: Insufficient documentation

## 2016-11-15 DIAGNOSIS — Z791 Long term (current) use of non-steroidal anti-inflammatories (NSAID): Secondary | ICD-10-CM | POA: Diagnosis not present

## 2016-11-15 MED ORDER — PREDNISONE 20 MG PO TABS: 20.0000 mg | ORAL_TABLET | Freq: Two times a day (BID) | ORAL | 0 refills | Status: DC

## 2016-11-15 MED ORDER — METHYLPREDNISOLONE SODIUM SUCC 125 MG IJ SOLR
125.0000 mg | Freq: Once | INTRAMUSCULAR | Status: AC
  Administered 2016-11-15: 125 mg via INTRAVENOUS
  Filled 2016-11-15: qty 2

## 2016-11-15 MED ORDER — FAMOTIDINE IN NACL 20-0.9 MG/50ML-% IV SOLN
20.0000 mg | Freq: Once | INTRAVENOUS | Status: AC
  Administered 2016-11-15: 20 mg via INTRAVENOUS
  Filled 2016-11-15: qty 50

## 2016-11-15 MED ORDER — CETIRIZINE HCL 10 MG PO TABS: 10.0000 mg | ORAL_TABLET | Freq: Every day | ORAL | 0 refills | Status: DC

## 2016-11-15 MED ORDER — DIPHENHYDRAMINE HCL 50 MG/ML IJ SOLN
25.0000 mg | Freq: Once | INTRAMUSCULAR | Status: AC
  Administered 2016-11-15: 25 mg via INTRAVENOUS
  Filled 2016-11-15: qty 1

## 2016-11-15 MED ORDER — FAMOTIDINE 20 MG PO TABS: 20.0000 mg | ORAL_TABLET | Freq: Two times a day (BID) | ORAL | 0 refills | Status: DC

## 2016-11-15 NOTE — Discharge Instructions (Signed)
Avoid using your new laundry detergent, and blanket. Medicationsas prescribed for the next 2 days. Return to ER as needed with any worsening symptoms

## 2016-11-15 NOTE — ED Notes (Signed)
ED Provider at bedside. 

## 2016-11-15 NOTE — ED Provider Notes (Signed)
[facility] Provider Note   CSN: [id number] Arrival date & time: 11/15/16  1007     History   Chief Complaint Chief Complaint  Patient presents with  . Rash    HPI Jennifer Yang is a 75 y.o. female.Chief complaint of itching rash  HPI patient presents for evaluation of itching rash. Started last night. Has been worsening. Has drunk arm and leg urticarial rash. No new medications or foods. Has a new laundry detergent got a new electric blanket for Christmas. Started feeling slightly nauseated this morning. No difficult breathing or mouth swelling.  Past Medical History:  Diagnosis Date  . Allergy    shell fish  . Anxiety    takes xanax on a rare occasion  . Arthritis    knees  . Benign essential HTN 11/24/2011  . Bladder infection 03/18/15   completed antibotics   . Breast cancer, right breast ([place]) 02/16/2008  . Breast cancer, stage 1 ([place]) 07/16/1990  . Cancer [facility])    right Chemo/radiation '01/radical '09  . Contact lens/glasses fitting   . 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 Family history of breast cancer    mother  . Hyperlipidemia   . Hypertension   . Lymphedema of arm 11/24/2011   right arm  . MVP (mitral valve prolapse)   . PONV (postoperative nausea and vomiting)    has vertigo also-would like scop patch  . Thyroid disease   . Vertigo     Patient Active Problem List   Diagnosis Date Noted  . Solitary pulmonary nodule 03/22/2015  . Gaseous abdominal distention 03/22/2015  . Near syncope 03/21/2015  . Orthostatic hypotension 03/21/2015  . Dyslipidemia 03/21/2015  . Lipoma of neck 07/28/2012  . 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 Lymphedema of arm 11/24/2011  . 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 Benign essential HTN 11/24/2011  . Cellulitis of arm, right 11/24/2011  . Breast cancer, right breast ([place]) 02/16/2008  . Breast cancer, stage 1 ([place]) 07/16/1990    Past Surgical History:  Procedure Laterality Date  . BREAST LUMPECTOMY     2001  right  . COLONOSCOPY  2006   w/Dr.Orr  . FOOT AMPUTATION     right hammer toe  . FOOT NEUROMA SURGERY     left  . KNEE ARTHROSCOPY     left  . MASS EXCISION  07/21/2012   Procedure: EXCISION MASS;  Surgeon: Adin Hector, MD;  Location: [place];  Service: General;  Laterality: N/A;  excision 15cm posterior neck mass posterior  . MASTECTOMY, RADICAL     2009 right  . THYROIDECTOMY, PARTIAL    . UPPER GASTROINTESTINAL ENDOSCOPY      OB History    No data available       Home Medications    Prior to Admission medications   Medication Sig Start Date End Date Taking? Authorizing Provider  ALPRAZolam Duanne Moron) 0.5 MG tablet Take 0.5 mg by mouth as needed for anxiety.    Yes Historical Provider, MD  alum & mag hydroxide-simeth (MAALOX/MYLANTA) 200-200-20 MG/5ML suspension Take 30 mLs by mouth every 6 (six) hours as needed for indigestion or heartburn. 03/22/15  Yes Nishant Dhungel, MD  aspirin 81 MG EC tablet Take 81 mg by mouth at bedtime.    Yes Historical Provider, MD  Calcium Carbonate-Vitamin D (CALTRATE 600+D PO) Take 1 tablet by mouth daily.    Yes Historical Provider, MD  Cholecalciferol (VITAMIN D) 1000 UNITS capsule Take 2,000 Units by mouth daily.  Yes Historical Provider, MD  fluticasone (FLONASE) 50 MCG/ACT nasal spray Place 1-2 sprays into both nostrils daily as needed for allergies or rhinitis.   Yes Historical Provider, MD  losartan (COZAAR) 25 MG tablet Take 25 mg by mouth daily.   Yes Historical Provider, MD  meclizine (ANTIVERT) 25 MG tablet Take 25 mg by mouth 3 (three) times daily as needed for dizziness.    Yes Historical Provider, MD  naproxen (NAPROSYN) 375 MG tablet Take 1 tablet (375 mg total) by mouth 2 (two) times daily. 09/03/16  Yes Alexandra M Law, PA-C  Naproxen Sodium (ALEVE) 220 MG CAPS Take 220 mg by mouth 3 (three) times daily as needed (pain).    Yes Historical Provider, MD  rosuvastatin (CRESTOR) 5 MG tablet Take 5 mg by mouth  daily. 09/01/16  Yes Historical Provider, MD  azithromycin (ZITHROMAX Z-PAK) 250 MG tablet Take as directed on package. 10/27/16   Eliezer Bottom, NP  cephALEXin (KEFLEX) 500 MG capsule Take 1 capsule (500 mg total) by mouth 2 (two) times daily. 09/03/16   Frederica Kuster, PA-C  cetirizine (ZYRTEC) 10 MG tablet Take 1 tablet (10 mg total) by mouth daily. 1 po q day prn allergies 11/15/16   Tanna Furry, MD  famotidine (PEPCID) 20 MG tablet Take 1 tablet (20 mg total) by mouth 2 (two) times daily. 11/15/16   Tanna Furry, MD  predniSONE (DELTASONE) 20 MG tablet Take 1 tablet (20 mg total) by mouth 2 (two) times daily with a meal. 11/15/16   Tanna Furry, MD    Family History Family History  Problem Relation Age of Onset  . Cancer Mother   . Heart disease Mother   . Heart disease Father   . Heart disease Brother   . Colon cancer Neg Hx     Social History Social History  Substance Use Topics  . Smoking status: Never Smoker  . Smokeless tobacco: Never Used     Comment: NEVER USED TOBACCO  . Alcohol use No     Allergies   Codeine sulfate; Fish-derived products; Rocephin [ceftriaxone sodium]; and Sulfa antibiotics   Review of Systems Review of Systems  Constitutional: Negative for appetite change, chills, diaphoresis, fatigue and fever.  HENT: Negative for mouth sores, sore throat and trouble swallowing.        No mouth or throat symptoms. No drooling or stridor. No sensation of swelling  Eyes: Negative for visual disturbance.  Respiratory: Negative for cough, chest tightness, shortness of breath and wheezing.        No difficulty breathing  Cardiovascular: Negative for chest pain.  Gastrointestinal: Negative for abdominal distention, abdominal pain, diarrhea, nausea and vomiting.  Endocrine: Negative for polydipsia, polyphagia and polyuria.  Genitourinary: Negative for dysuria, frequency and hematuria.  Musculoskeletal: Negative for gait problem.  Skin: Positive for rash.  Negative for color change and pallor.  Neurological: Negative for dizziness, syncope, light-headedness and headaches.  Hematological: Does not bruise/bleed easily.  Psychiatric/Behavioral: Negative for behavioral problems and confusion.     Physical Exam Updated Vital Signs BP 152/80 (BP Location: Left Arm)   Pulse 73   Temp 98.1 F (36.7 C) (Oral)   Resp 16   Ht 5\' 9"  (1.753 m)   Wt 192 lb (87.1 kg)   SpO2 100%   BMI 28.35 kg/m   Physical Exam  Constitutional: She is oriented to person, place, and time. She appears well-developed and well-nourished. No distress.  HENT:  Head: Normocephalic.  Normal tongue and pharynx.  No soft tissue swelling. No drooling or stridor. Normal voice.  Eyes: Conjunctivae are normal. Pupils are equal, round, and reactive to light. No scleral icterus.  Neck: Normal range of motion. Neck supple. No thyromegaly present.  Cardiovascular: Normal rate and regular rhythm.  Exam reveals no gallop and no friction rub.   No murmur heard. Pulmonary/Chest: Effort normal and breath sounds normal. No respiratory distress. She has no wheezes. She has no rales.  Clear lungs. No increased work of breathing. No wheezing rales or rhonchi.  Abdominal: Soft. Bowel sounds are normal. She exhibits no distension. There is no tenderness. There is no rebound.  Musculoskeletal: Normal range of motion.  Neurological: She is alert and oriented to person, place, and time.  Skin: Skin is warm and dry. No rash noted.  Diffuse urticaria  Psychiatric: She has a normal mood and affect. Her behavior is normal.     ED Treatments / Results  Labs (all labs ordered are listed, but only abnormal results are displayed) Labs Reviewed - No data to display  EKG  EKG Interpretation None       Radiology No results found.  Procedures Procedures (including critical care time)  Medications Ordered in ED Medications  diphenhydrAMINE (BENADRYL) injection 25 mg (25 mg Intravenous  Given 11/15/16 1055)  methylPREDNISolone sodium succinate (SOLU-MEDROL) 125 mg/2 mL injection 125 mg (125 mg Intravenous Given 11/15/16 1057)  famotidine (PEPCID) IVPB 20 mg premix (20 mg Intravenous New Bag/Given 11/15/16 1120)     Initial Impression / Assessment and Plan / ED Course  I have reviewed the triage vital signs and the nursing notes.  Pertinent labs & imaging results that were available during my care of the patient were reviewed by me and considered in my medical decision making (see chart for details).  Clinical Course     Allergic reaction, antigen uncertain. IV placed. Given Benadryl 50, site Medrol 125, Pepcid 20. No indication for epinephrine at this point. With consideration of her age and history of hypertension will avoid if possible. Plan frequent re-evaluations.  Final Clinical Impressions(s) / ED Diagnoses   Final diagnoses:  Urticaria  Hives    On recheck urticaria is greatly improved but not completely resolved. No airway symptoms. No stomach symptoms. Stable vital signs. Appropriate for discharge home. Will avoid her new blanket and some laundry detergent. Recheck here with any difficulty breathing lightheadedness abdominal symptoms are worsening. 48 hours prednisone, Zyrtec, Pepcid.  New Prescriptions New Prescriptions   CETIRIZINE (ZYRTEC) 10 MG TABLET    Take 1 tablet (10 mg total) by mouth daily. 1 po q day prn allergies   FAMOTIDINE (PEPCID) 20 MG TABLET    Take 1 tablet (20 mg total) by mouth 2 (two) times daily.   PREDNISONE (DELTASONE) 20 MG TABLET    Take 1 tablet (20 mg total) by mouth 2 (two) times daily with a meal.     Tanna Furry, MD 11/15/16 [phone]

## 2016-11-15 NOTE — ED Triage Notes (Signed)
Pt reports itchy rash that developed last night. Reports using new laundry detergent x 1wk. Pt has rash/hives to bil UE and bil feet, bil thighs, torso and buttocks. Reports nausea and chills that began this am. Denies v/d, sob, chest pain.

## 2016-11-19 DIAGNOSIS — L509 Urticaria, unspecified: Secondary | ICD-10-CM | POA: Diagnosis not present

## 2016-12-02 DIAGNOSIS — Z0101 Encounter for examination of eyes and vision with abnormal findings: Secondary | ICD-10-CM | POA: Diagnosis not present

## 2016-12-17 DIAGNOSIS — M170 Bilateral primary osteoarthritis of knee: Secondary | ICD-10-CM | POA: Diagnosis not present

## 2016-12-17 DIAGNOSIS — M5441 Lumbago with sciatica, right side: Secondary | ICD-10-CM | POA: Diagnosis not present

## 2016-12-17 DIAGNOSIS — M4686 Other specified inflammatory spondylopathies, lumbar region: Secondary | ICD-10-CM | POA: Diagnosis not present

## 2017-01-01 DIAGNOSIS — E1122 Type 2 diabetes mellitus with diabetic chronic kidney disease: Secondary | ICD-10-CM | POA: Diagnosis not present

## 2017-01-01 DIAGNOSIS — E538 Deficiency of other specified B group vitamins: Secondary | ICD-10-CM | POA: Diagnosis not present

## 2017-01-01 DIAGNOSIS — E559 Vitamin D deficiency, unspecified: Secondary | ICD-10-CM | POA: Diagnosis not present

## 2017-01-01 DIAGNOSIS — M810 Age-related osteoporosis without current pathological fracture: Secondary | ICD-10-CM | POA: Diagnosis not present

## 2017-01-01 DIAGNOSIS — Z0000 Encounter for general adult medical examination without abnormal findings: Secondary | ICD-10-CM | POA: Diagnosis not present

## 2017-01-01 DIAGNOSIS — E785 Hyperlipidemia, unspecified: Secondary | ICD-10-CM | POA: Diagnosis not present

## 2017-01-01 DIAGNOSIS — I129 Hypertensive chronic kidney disease with stage 1 through stage 4 chronic kidney disease, or unspecified chronic kidney disease: Secondary | ICD-10-CM | POA: Diagnosis not present

## 2017-01-01 DIAGNOSIS — N390 Urinary tract infection, site not specified: Secondary | ICD-10-CM | POA: Diagnosis not present

## 2017-01-02 DIAGNOSIS — H2513 Age-related nuclear cataract, bilateral: Secondary | ICD-10-CM | POA: Diagnosis not present

## 2017-01-08 DIAGNOSIS — Z0000 Encounter for general adult medical examination without abnormal findings: Secondary | ICD-10-CM | POA: Diagnosis not present

## 2017-01-08 DIAGNOSIS — H6121 Impacted cerumen, right ear: Secondary | ICD-10-CM | POA: Diagnosis not present

## 2017-01-08 DIAGNOSIS — I700 Atherosclerosis of aorta: Secondary | ICD-10-CM | POA: Diagnosis not present

## 2017-01-08 DIAGNOSIS — E1122 Type 2 diabetes mellitus with diabetic chronic kidney disease: Secondary | ICD-10-CM | POA: Diagnosis not present

## 2017-01-08 DIAGNOSIS — N183 Chronic kidney disease, stage 3 (moderate): Secondary | ICD-10-CM | POA: Diagnosis not present

## 2017-01-08 IMAGING — CT CT CHEST W/O CM
2 of 3 series · 15 of 36 positions shown, 18 images · non-contrast
Comparison: None.

03/21/2015

CLINICAL DATA: History of breast cancer.  Pulmonary nodule.

EXAM:
CT CHEST WITHOUT CONTRAST
TECHNIQUE: Multidetector CT imaging of the chest was performed following the
standard protocol without IV contrast.

[Series 2: thorax · axial · 0.82mm/px · z∈[-222,+36]mm · 12 of 153 slices shown, 15 images]
[im 12/153  mediastinal]
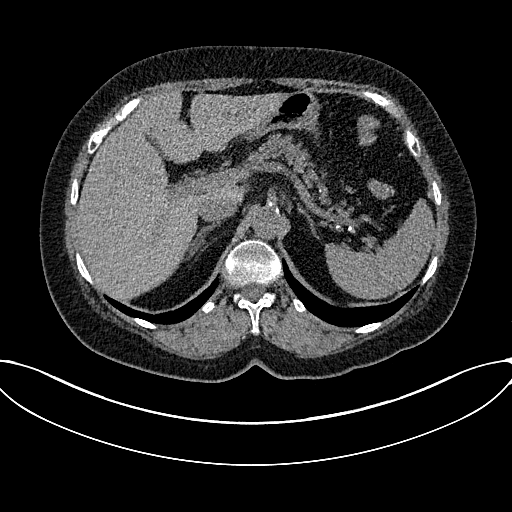
[im 12/153  lung]
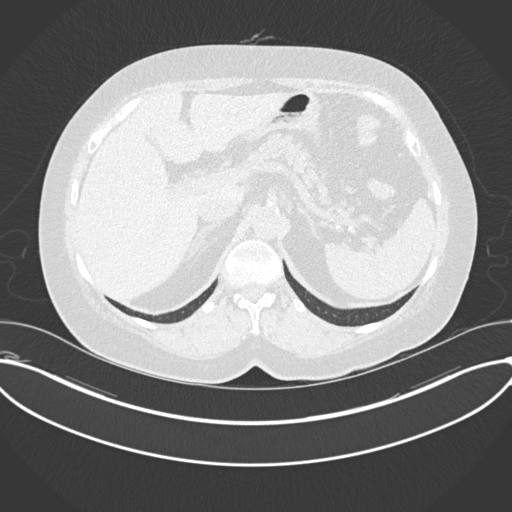
[im 23/153  lung]
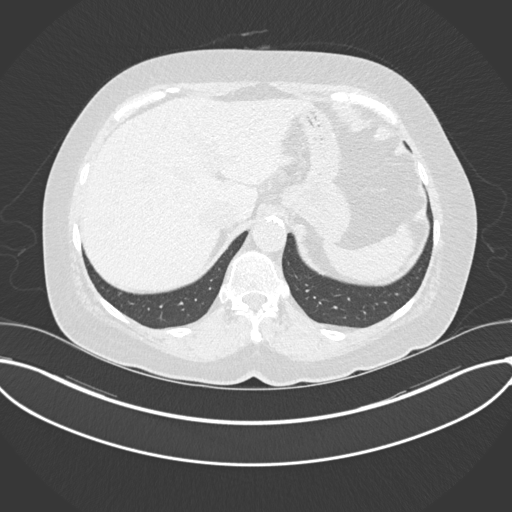
[im 34/153  lung]
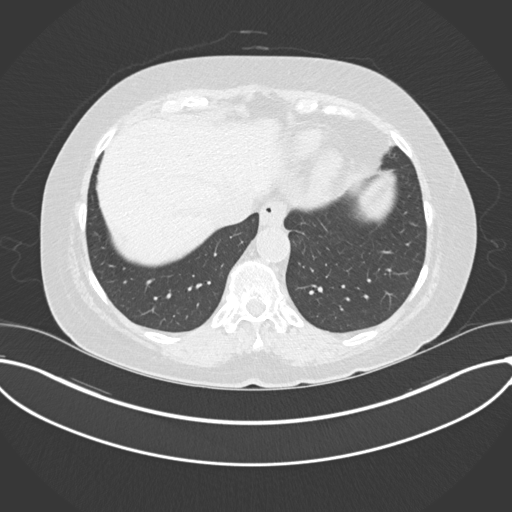
[im 46/153  lung]
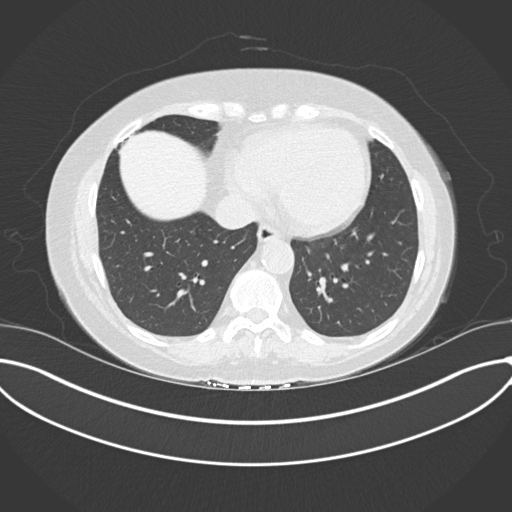
[im 57/153  mediastinal]
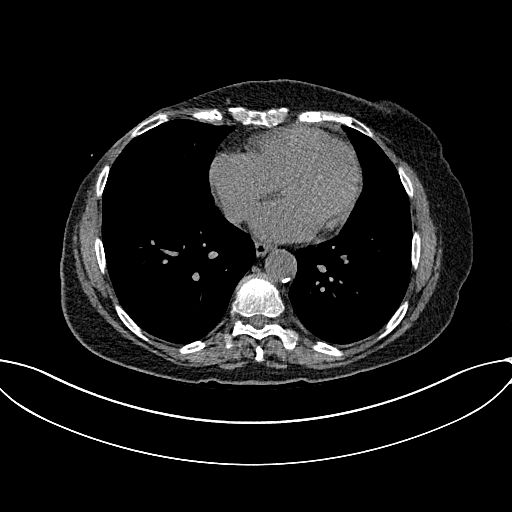
[im 57/153  lung]
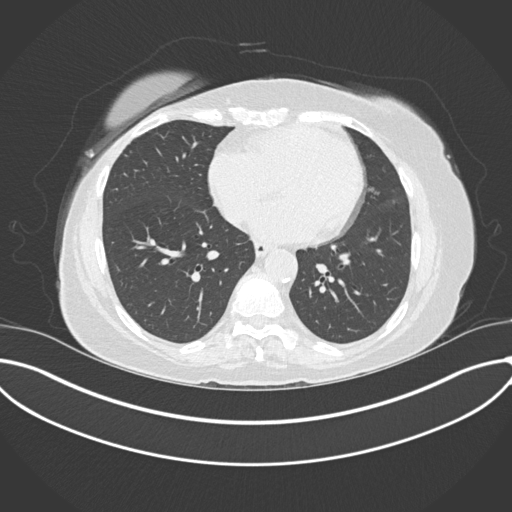
[im 68/153  lung]
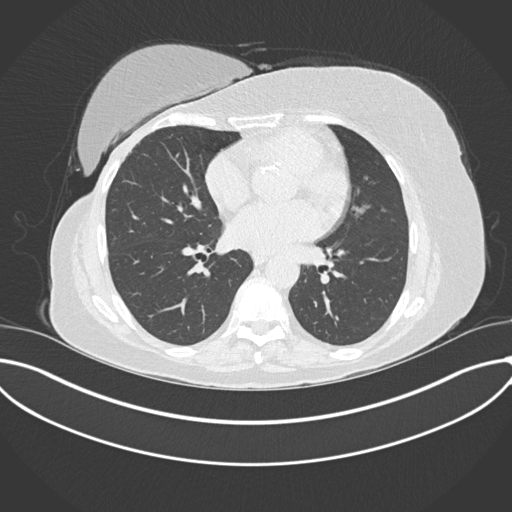
[im 85/153  lung]
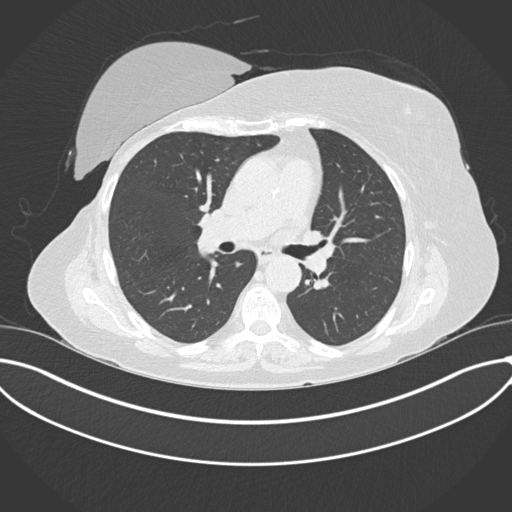
[im 96/153  lung]
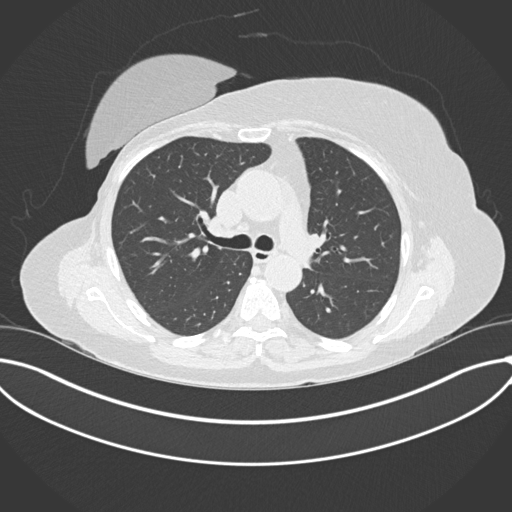
[im 107/153  mediastinal]
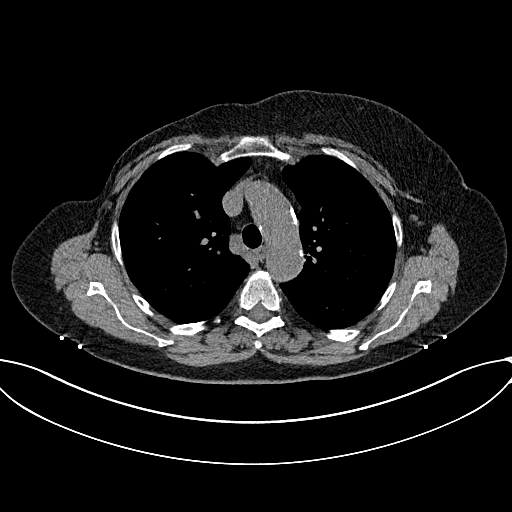
[im 107/153  lung]
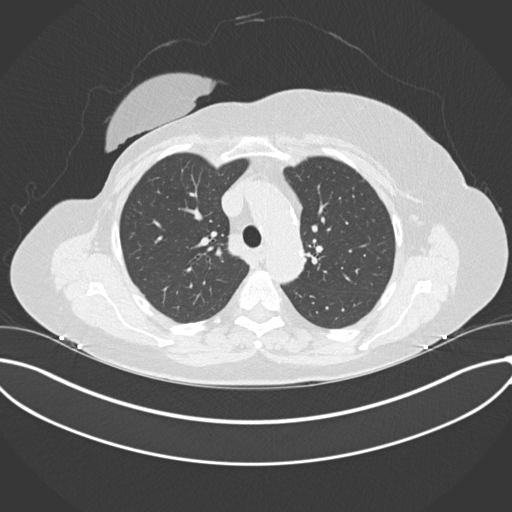
[im 119/153  lung]
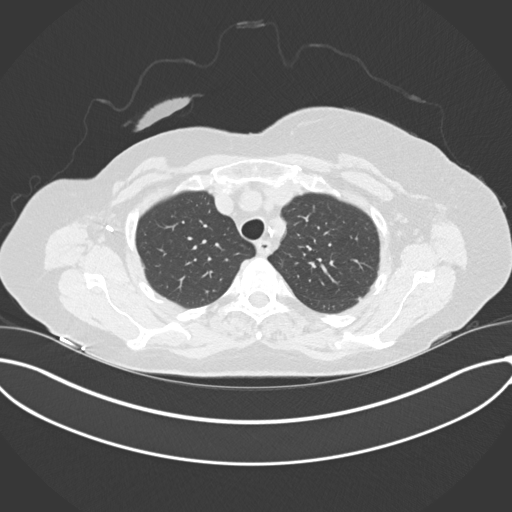
[im 130/153  lung]
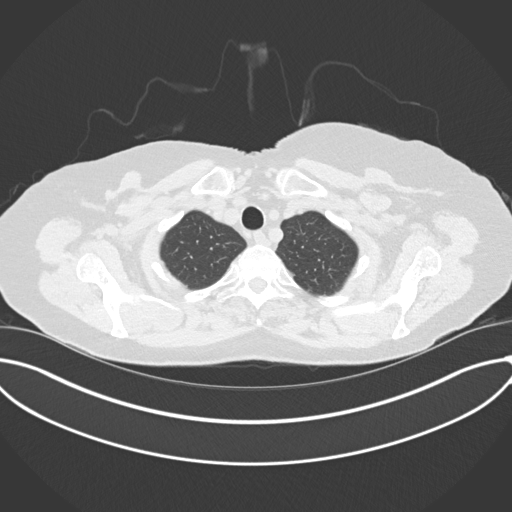
[im 141/153  lung]
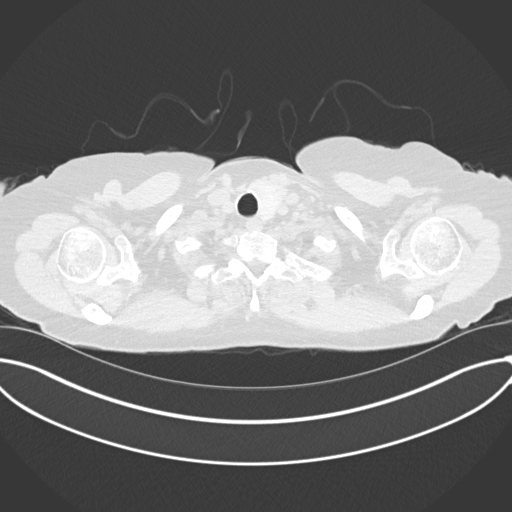

[Series 5: coronal · coronal · 0.62mm/px · 3 of 153 slices shown]
[im 31/153  lung]
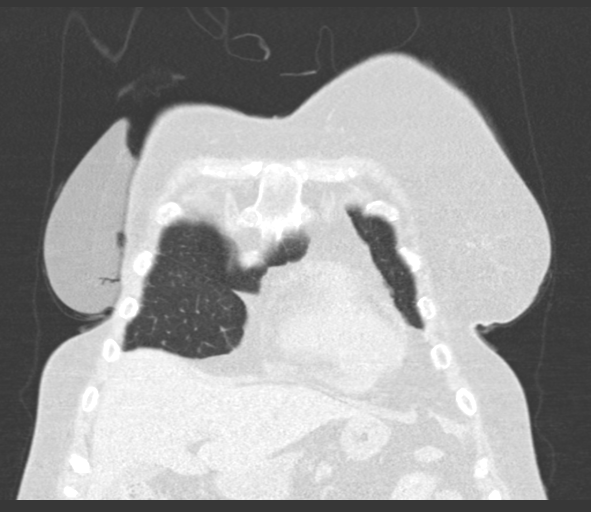
[im 61/153  lung]
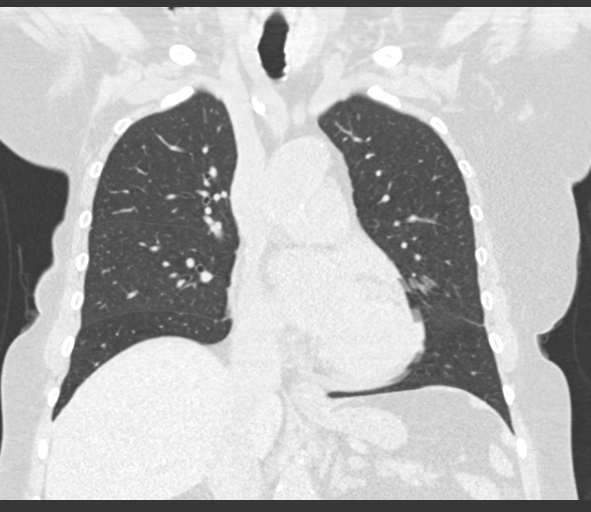
[im 92/153  lung]
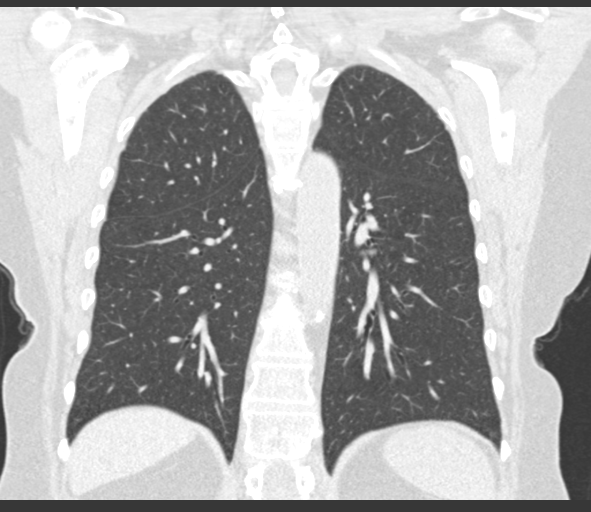

[15 of 36 positions shown; findings below may reference images not displayed]

FINDINGS: Mediastinum/Lymph Nodes: Normal heart size. There is no pericardial
effusion. Aortic atherosclerosis identified. Calcification involving
the LAD and left circumflex coronary artery noted. The trachea
appears patent and is midline. Normal appearance of the esophagus.
There is no mediastinal or hilar adenopathy identified. Status post
right mastectomy. No axillary or supraclavicular adenopathy
identified.

Lungs/Pleura: There is no airspace consolidation or atelectasis.
Within the anterior left upper lobe there is a stable nodule
measuring 2 mm, image 40 of series 3. No new pulmonary nodules
identified.

Upper abdomen: The adrenal glands are normal. Normal appearance of
the visualized portions of the liver and spleen.

Musculoskeletal: No aggressive lytic or sclerotic bone lesions
identified. Multi level disc space narrowing and ventral endplate
spurring noted.
IMPRESSION: 1. No acute cardiopulmonary abnormalities.
2. Aortic atherosclerosis and multi vessel coronary artery
calcification.
3. 2 mm nodule in the anterior right upper lobe is stable from
previous exam and is compatible with a benign etiology.

## 2017-01-21 DIAGNOSIS — Z853 Personal history of malignant neoplasm of breast: Secondary | ICD-10-CM | POA: Diagnosis not present

## 2017-01-21 DIAGNOSIS — Z803 Family history of malignant neoplasm of breast: Secondary | ICD-10-CM | POA: Diagnosis not present

## 2017-01-21 DIAGNOSIS — Z1231 Encounter for screening mammogram for malignant neoplasm of breast: Secondary | ICD-10-CM | POA: Diagnosis not present

## 2017-01-22 ENCOUNTER — Encounter: Payer: Self-pay | Admitting: Hematology & Oncology

## 2017-01-23 DIAGNOSIS — R350 Frequency of micturition: Secondary | ICD-10-CM | POA: Diagnosis not present

## 2017-02-11 DIAGNOSIS — M1711 Unilateral primary osteoarthritis, right knee: Secondary | ICD-10-CM | POA: Diagnosis not present

## 2017-02-11 DIAGNOSIS — M791 Myalgia: Secondary | ICD-10-CM | POA: Diagnosis not present

## 2017-02-11 DIAGNOSIS — G8929 Other chronic pain: Secondary | ICD-10-CM | POA: Diagnosis not present

## 2017-02-11 DIAGNOSIS — M25561 Pain in right knee: Secondary | ICD-10-CM | POA: Diagnosis not present

## 2017-02-11 DIAGNOSIS — M25562 Pain in left knee: Secondary | ICD-10-CM | POA: Diagnosis not present

## 2017-02-11 DIAGNOSIS — M170 Bilateral primary osteoarthritis of knee: Secondary | ICD-10-CM | POA: Diagnosis not present

## 2017-02-11 DIAGNOSIS — M1712 Unilateral primary osteoarthritis, left knee: Secondary | ICD-10-CM | POA: Diagnosis not present

## 2017-04-01 DIAGNOSIS — M79671 Pain in right foot: Secondary | ICD-10-CM | POA: Diagnosis not present

## 2017-04-01 DIAGNOSIS — S99921A Unspecified injury of right foot, initial encounter: Secondary | ICD-10-CM | POA: Diagnosis not present

## 2017-04-01 DIAGNOSIS — L03031 Cellulitis of right toe: Secondary | ICD-10-CM | POA: Diagnosis not present

## 2017-04-16 DIAGNOSIS — L03031 Cellulitis of right toe: Secondary | ICD-10-CM | POA: Diagnosis not present

## 2017-04-27 ENCOUNTER — Ambulatory Visit (HOSPITAL_BASED_OUTPATIENT_CLINIC_OR_DEPARTMENT_OTHER): Payer: Medicare HMO

## 2017-04-27 ENCOUNTER — Ambulatory Visit (HOSPITAL_BASED_OUTPATIENT_CLINIC_OR_DEPARTMENT_OTHER): Payer: Medicare HMO | Admitting: Family

## 2017-04-27 ENCOUNTER — Other Ambulatory Visit (HOSPITAL_BASED_OUTPATIENT_CLINIC_OR_DEPARTMENT_OTHER): Payer: Medicare HMO

## 2017-04-27 VITALS — BP 179/67 | HR 51 | Temp 98.2°F | Resp 18 | Wt 193.0 lb

## 2017-04-27 DIAGNOSIS — T50905A Adverse effect of unspecified drugs, medicaments and biological substances, initial encounter: Principal | ICD-10-CM

## 2017-04-27 DIAGNOSIS — C50911 Malignant neoplasm of unspecified site of right female breast: Secondary | ICD-10-CM | POA: Diagnosis not present

## 2017-04-27 DIAGNOSIS — M818 Other osteoporosis without current pathological fracture: Secondary | ICD-10-CM

## 2017-04-27 DIAGNOSIS — Z853 Personal history of malignant neoplasm of breast: Secondary | ICD-10-CM

## 2017-04-27 DIAGNOSIS — Z170 Estrogen receptor positive status [ER+]: Principal | ICD-10-CM

## 2017-04-27 DIAGNOSIS — Z9011 Acquired absence of right breast and nipple: Secondary | ICD-10-CM

## 2017-04-27 LAB — CMP (CANCER CENTER ONLY)
ALBUMIN: 3.4 g/dL (ref 3.3–5.5)
ALK PHOS: 41 U/L (ref 26–84)
ALT: 19 U/L (ref 10–47)
AST: 22 U/L (ref 11–38)
BUN: 13 mg/dL (ref 7–22)
CALCIUM: 9 mg/dL (ref 8.0–10.3)
CHLORIDE: 106 meq/L (ref 98–108)
CO2: 27 mEq/L (ref 18–33)
Creat: 1 mg/dl (ref 0.6–1.2)
Glucose, Bld: 112 mg/dL (ref 73–118)
POTASSIUM: 4 meq/L (ref 3.3–4.7)
Sodium: 141 mEq/L (ref 128–145)
TOTAL PROTEIN: 6.3 g/dL — AB (ref 6.4–8.1)
Total Bilirubin: 0.8 mg/dl (ref 0.20–1.60)

## 2017-04-27 LAB — CBC WITH DIFFERENTIAL (CANCER CENTER ONLY)
BASO#: 0 10*3/uL (ref 0.0–0.2)
BASO%: 0.6 % (ref 0.0–2.0)
EOS ABS: 0.1 10*3/uL (ref 0.0–0.5)
EOS%: 2.6 % (ref 0.0–7.0)
HEMATOCRIT: 38.2 % (ref 34.8–46.6)
HEMOGLOBIN: 12.5 g/dL (ref 11.6–15.9)
LYMPH#: 1.9 10*3/uL (ref 0.9–3.3)
LYMPH%: 35.2 % (ref 14.0–48.0)
MCH: 30.9 pg (ref 26.0–34.0)
MCHC: 32.7 g/dL (ref 32.0–36.0)
MCV: 94 fL (ref 81–101)
MONO#: 0.5 10*3/uL (ref 0.1–0.9)
MONO%: 8.7 % (ref 0.0–13.0)
NEUT#: 2.9 10*3/uL (ref 1.5–6.5)
NEUT%: 52.9 % (ref 39.6–80.0)
Platelets: 148 10*3/uL (ref 145–400)
RBC: 4.05 10*6/uL (ref 3.70–5.32)
RDW: 13.7 % (ref 11.1–15.7)
WBC: 5.4 10*3/uL (ref 3.9–10.0)

## 2017-04-27 MED ORDER — ZOLEDRONIC ACID 4 MG/100ML IV SOLN
4.0000 mg | Freq: Once | INTRAVENOUS | Status: AC
  Administered 2017-04-27: 4 mg via INTRAVENOUS
  Filled 2017-04-27: qty 100

## 2017-04-27 NOTE — Progress Notes (Signed)
Hematology and Oncology Follow Up Visit  Jennifer Yang [id number] March 01, 1941 76 y.o. 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 She is doing well and has no complaints at this time.  Chest exam today was negative. Ruight mastectomy site intact. No changes with left breast. No mass, lesion or rash. Mammogram in March was negative.  She has occasional lymphedema in her right arm. She will still wrap her arm at night instead of wearing a compression sleeve during the day. This has helped reduce her swelling and is much mor comfortable for her.  No lymphadenopathy found on exam.  No fever, chills, n/v, cough,  dizziness, SOB, chest pain, palpitations, abdominal pain or changes in bowel or bladder habits.  No episodes of bleeding or bruising.  She has a red patch of what appears to be eczema on her left buttock near her hip. She states that it itches on occasion and she has been putting triple antibiotic ointment on it with no improvement. She has an appointment with dermatology this week on Wednesday for further assessment.  No swelling, tenderness, numbness or tingling in her extremities at this time. No c/o pain.  She has maintained a good appetite and is staying well hydrated. Her weight is stable.   ECOG Performance Status: 0 - Asymptomatic  Medications:  Allergies as of 04/27/2017      Reactions   Codeine Sulfate Swelling   Fish-derived Products Other (See Comments)   Cellulitis    Rocephin [ceftriaxone Sodium] Other (See Comments)   Low blood count   Sulfa Antibiotics Other (See Comments)   Mouth and lip sores      Medication List       Accurate as of 04/27/17  8:42 AM. Always use your most recent med list.          ALEVE 220 MG  Caps Generic drug:  Naproxen Sodium Take 220 mg by mouth 3 (three) times daily as needed (pain).   ALPRAZolam 0.5 MG tablet Commonly known as:  XANAX Take 0.5 mg by mouth as needed for anxiety.   alum & mag hydroxide-simeth 200-200-20 MG/5ML suspension Commonly known as:  MAALOX/MYLANTA Take 30 mLs by mouth every 6 (six) hours as needed for indigestion or heartburn.   aspirin 81 MG EC tablet Take 81 mg by mouth at bedtime.   azithromycin 250 MG tablet Commonly known as:  ZITHROMAX Z-PAK Take as directed on package.   CALTRATE 600+D PO Take 1 tablet by mouth daily.   cephALEXin 500 MG capsule Commonly known as:  KEFLEX Take 1 capsule (500 mg total) by mouth 2 (two) times daily.   cetirizine 10 MG tablet Commonly known as:  ZYRTEC Take 1 tablet (10 mg total) by mouth daily. 1 po q day prn allergies   famotidine 20 MG tablet Commonly known as:  PEPCID Take 1 tablet (20 mg total) by mouth 2 (two) times daily.   fluticasone 50 MCG/ACT nasal spray Commonly known as:  FLONASE Place 1-2 sprays into both nostrils daily as needed for allergies or rhinitis.   losartan 25 MG tablet Commonly known as:  COZAAR Take 25 mg by mouth daily.   meclizine 25 MG tablet Commonly known as:  ANTIVERT  Take 25 mg by mouth 3 (three) times daily as needed for dizziness.   naproxen 375 MG tablet Commonly known as:  NAPROSYN Take 1 tablet (375 mg total) by mouth 2 (two) times daily.   predniSONE 20 MG tablet Commonly known as:  DELTASONE Take 1 tablet (20 mg total) by mouth 2 (two) times daily with a meal.   rosuvastatin 5 MG tablet Commonly known as:  CRESTOR Take 5 mg by mouth daily.   Vitamin D 1000 units capsule Take 2,000 Units by mouth daily.       Allergies:  Allergies  Allergen Reactions  . Codeine Sulfate Swelling  . Fish-Derived Products Other (See Comments)    Cellulitis   . Rocephin [Ceftriaxone Sodium] Other (See Comments)    Low blood count  . Sulfa Antibiotics  Other (See Comments)    Mouth and lip sores    Past Medical History, Surgical history, Social history, and Family History were reviewed and updated.  Review of Systems: All other 10 point review of systems is negative.   Physical Exam:  vitals were not taken for this visit.  Wt Readings from Last 3 Encounters:  11/15/16 192 lb (87.1 kg)  10/27/16 192 lb (87.1 kg)  04/28/16 190 lb (86.2 kg)    Ocular: Sclerae unicteric, pupils equal, round and reactive to light Ear-nose-throat: Oropharynx clear, dentition fair Lymphatic: No cervical, supraclavicular or axillary adenopathy Lungs no rales or rhonchi, good excursion bilaterally Heart regular rate and rhythm, no murmur appreciated Abd soft, nontender, positive bowel sounds, no liver or spleen tip palpated on exam, no fluid wave MSK no focal spinal tenderness, no joint edema Neuro: non-focal, well-oriented, appropriate affect Breasts: No change with left breast. Right mastectomy intact. No mass, lesion or rash noted on exam.   Lab Results  Component Value Date   WBC 6.9 10/27/2016   HGB 12.6 10/27/2016   HCT 38.5 10/27/2016   MCV 94 10/27/2016   PLT 162 10/27/2016   No results found for: FERRITIN, IRON, TIBC, UIBC, IRONPCTSAT Lab Results  Component Value Date   RBC 4.11 10/27/2016   No results found for: KPAFRELGTCHN, LAMBDASER, KAPLAMBRATIO No results found for: IGGSERUM, IGA, IGMSERUM No results found for: Odetta Pink, SPEI   Chemistry      Component Value Date/Time   NA 144 10/27/2016 0918   NA 144 05/24/2013 0959   K 4.4 10/27/2016 0918   K 3.9 05/24/2013 0959   CL 101 10/27/2016 0918   CO2 30 10/27/2016 0918   CO2 27 05/24/2013 0959   BUN 18 10/27/2016 0918   BUN 19.7 05/24/2013 0959   CREATININE 1.2 10/27/2016 0918   CREATININE 1.1 05/24/2013 0959      Component Value Date/Time   CALCIUM 10.1 10/27/2016 0918   CALCIUM 9.8 05/24/2013 0959   ALKPHOS 35  10/27/2016 0918   AST 27 10/27/2016 0918   ALT 25 10/27/2016 0918   BILITOT 0.70 10/27/2016 0918      Impression and Plan: Jennifer Yang is a very pleasant 76 yo caucasian female with history of 2 separate primaries both stage 1 and ER positive. She had a right mastectomy. She continues to do well and is asymptomatic at this time. Her exam today was negative.  She is due for her annual Zometa today and we will proceed with her infusion as planned.  She is following up with dermatology later this week regarding the itchy patch of skin on her hip.  We will plan to see her back again in 6 months for follow-up and repeat lab work.  Both she and her husband know to contact our office with any questions or concerns. We can certainly see her sooner if need be.   Eliezer Bottom, NP 6/11/20188:42 AM

## 2017-04-27 NOTE — Patient Instructions (Signed)

## 2017-04-28 LAB — VITAMIN D 25 HYDROXY (VIT D DEFICIENCY, FRACTURES): VIT D 25 HYDROXY: 45.6 ng/mL (ref 30.0–100.0)

## 2017-04-29 DIAGNOSIS — L309 Dermatitis, unspecified: Secondary | ICD-10-CM | POA: Diagnosis not present

## 2017-05-05 DIAGNOSIS — M170 Bilateral primary osteoarthritis of knee: Secondary | ICD-10-CM | POA: Diagnosis not present

## 2017-05-11 DIAGNOSIS — M170 Bilateral primary osteoarthritis of knee: Secondary | ICD-10-CM | POA: Diagnosis not present

## 2017-05-11 DIAGNOSIS — M25561 Pain in right knee: Secondary | ICD-10-CM | POA: Diagnosis not present

## 2017-05-11 DIAGNOSIS — M25562 Pain in left knee: Secondary | ICD-10-CM | POA: Diagnosis not present

## 2017-05-11 DIAGNOSIS — G8929 Other chronic pain: Secondary | ICD-10-CM | POA: Diagnosis not present

## 2017-05-14 DIAGNOSIS — C50911 Malignant neoplasm of unspecified site of right female breast: Secondary | ICD-10-CM | POA: Diagnosis not present

## 2017-05-14 DIAGNOSIS — R911 Solitary pulmonary nodule: Secondary | ICD-10-CM | POA: Diagnosis not present

## 2017-05-22 DIAGNOSIS — M170 Bilateral primary osteoarthritis of knee: Secondary | ICD-10-CM | POA: Diagnosis not present

## 2017-06-24 ENCOUNTER — Other Ambulatory Visit: Payer: Self-pay | Admitting: Dermatology

## 2017-06-24 DIAGNOSIS — C44519 Basal cell carcinoma of skin of other part of trunk: Secondary | ICD-10-CM | POA: Diagnosis not present

## 2017-07-03 DIAGNOSIS — M25562 Pain in left knee: Secondary | ICD-10-CM | POA: Diagnosis not present

## 2017-07-03 DIAGNOSIS — I129 Hypertensive chronic kidney disease with stage 1 through stage 4 chronic kidney disease, or unspecified chronic kidney disease: Secondary | ICD-10-CM | POA: Diagnosis not present

## 2017-07-03 DIAGNOSIS — E785 Hyperlipidemia, unspecified: Secondary | ICD-10-CM | POA: Diagnosis not present

## 2017-07-03 DIAGNOSIS — E1122 Type 2 diabetes mellitus with diabetic chronic kidney disease: Secondary | ICD-10-CM | POA: Diagnosis not present

## 2017-07-03 DIAGNOSIS — E538 Deficiency of other specified B group vitamins: Secondary | ICD-10-CM | POA: Diagnosis not present

## 2017-07-03 DIAGNOSIS — E559 Vitamin D deficiency, unspecified: Secondary | ICD-10-CM | POA: Diagnosis not present

## 2017-07-03 DIAGNOSIS — N390 Urinary tract infection, site not specified: Secondary | ICD-10-CM | POA: Diagnosis not present

## 2017-07-03 DIAGNOSIS — M170 Bilateral primary osteoarthritis of knee: Secondary | ICD-10-CM | POA: Diagnosis not present

## 2017-07-03 DIAGNOSIS — G8929 Other chronic pain: Secondary | ICD-10-CM | POA: Diagnosis not present

## 2017-07-03 DIAGNOSIS — M25561 Pain in right knee: Secondary | ICD-10-CM | POA: Diagnosis not present

## 2017-07-09 DIAGNOSIS — I700 Atherosclerosis of aorta: Secondary | ICD-10-CM | POA: Diagnosis not present

## 2017-07-09 DIAGNOSIS — I129 Hypertensive chronic kidney disease with stage 1 through stage 4 chronic kidney disease, or unspecified chronic kidney disease: Secondary | ICD-10-CM | POA: Diagnosis not present

## 2017-07-09 DIAGNOSIS — N183 Chronic kidney disease, stage 3 (moderate): Secondary | ICD-10-CM | POA: Diagnosis not present

## 2017-07-09 DIAGNOSIS — E1122 Type 2 diabetes mellitus with diabetic chronic kidney disease: Secondary | ICD-10-CM | POA: Diagnosis not present

## 2017-07-29 DIAGNOSIS — R69 Illness, unspecified: Secondary | ICD-10-CM | POA: Diagnosis not present

## 2017-08-10 ENCOUNTER — Other Ambulatory Visit: Payer: Self-pay | Admitting: Dermatology

## 2017-08-10 DIAGNOSIS — C4441 Basal cell carcinoma of skin of scalp and neck: Secondary | ICD-10-CM | POA: Diagnosis not present

## 2017-08-10 DIAGNOSIS — L820 Inflamed seborrheic keratosis: Secondary | ICD-10-CM | POA: Diagnosis not present

## 2017-08-25 DIAGNOSIS — Z23 Encounter for immunization: Secondary | ICD-10-CM | POA: Diagnosis not present

## 2017-08-26 DIAGNOSIS — H2513 Age-related nuclear cataract, bilateral: Secondary | ICD-10-CM | POA: Diagnosis not present

## 2017-09-10 DIAGNOSIS — R69 Illness, unspecified: Secondary | ICD-10-CM | POA: Diagnosis not present

## 2017-09-12 ENCOUNTER — Other Ambulatory Visit: Payer: Self-pay | Admitting: Nurse Practitioner

## 2017-09-16 DIAGNOSIS — H2513 Age-related nuclear cataract, bilateral: Secondary | ICD-10-CM | POA: Diagnosis not present

## 2017-09-16 DIAGNOSIS — H25043 Posterior subcapsular polar age-related cataract, bilateral: Secondary | ICD-10-CM | POA: Diagnosis not present

## 2017-09-16 DIAGNOSIS — H524 Presbyopia: Secondary | ICD-10-CM | POA: Diagnosis not present

## 2017-09-21 DIAGNOSIS — L821 Other seborrheic keratosis: Secondary | ICD-10-CM | POA: Diagnosis not present

## 2017-09-21 DIAGNOSIS — Z85828 Personal history of other malignant neoplasm of skin: Secondary | ICD-10-CM | POA: Diagnosis not present

## 2017-10-14 DIAGNOSIS — Z01419 Encounter for gynecological examination (general) (routine) without abnormal findings: Secondary | ICD-10-CM | POA: Diagnosis not present

## 2017-10-14 DIAGNOSIS — N898 Other specified noninflammatory disorders of vagina: Secondary | ICD-10-CM | POA: Diagnosis not present

## 2017-10-26 ENCOUNTER — Ambulatory Visit: Payer: Medicare Other | Admitting: Hematology & Oncology

## 2017-10-26 ENCOUNTER — Other Ambulatory Visit: Payer: Medicare Other

## 2017-11-09 ENCOUNTER — Other Ambulatory Visit: Payer: Self-pay

## 2017-11-09 ENCOUNTER — Other Ambulatory Visit: Payer: Medicare Other | Admitting: Hematology & Oncology

## 2017-11-09 ENCOUNTER — Ambulatory Visit (HOSPITAL_BASED_OUTPATIENT_CLINIC_OR_DEPARTMENT_OTHER): Payer: Medicare HMO | Admitting: Hematology & Oncology

## 2017-11-09 ENCOUNTER — Other Ambulatory Visit (HOSPITAL_BASED_OUTPATIENT_CLINIC_OR_DEPARTMENT_OTHER): Payer: Medicare HMO

## 2017-11-09 ENCOUNTER — Encounter: Payer: Self-pay | Admitting: Hematology & Oncology

## 2017-11-09 VITALS — BP 175/64 | HR 50 | Temp 97.8°F | Resp 16 | Wt 197.0 lb

## 2017-11-09 DIAGNOSIS — C50911 Malignant neoplasm of unspecified site of right female breast: Secondary | ICD-10-CM

## 2017-11-09 DIAGNOSIS — Z853 Personal history of malignant neoplasm of breast: Secondary | ICD-10-CM

## 2017-11-09 DIAGNOSIS — Z9011 Acquired absence of right breast and nipple: Secondary | ICD-10-CM

## 2017-11-09 DIAGNOSIS — Z170 Estrogen receptor positive status [ER+]: Principal | ICD-10-CM

## 2017-11-09 DIAGNOSIS — M818 Other osteoporosis without current pathological fracture: Secondary | ICD-10-CM

## 2017-11-09 DIAGNOSIS — T50905A Adverse effect of unspecified drugs, medicaments and biological substances, initial encounter: Principal | ICD-10-CM

## 2017-11-09 LAB — CBC WITH DIFFERENTIAL (CANCER CENTER ONLY)
BASO#: 0 10*3/uL (ref 0.0–0.2)
BASO%: 0.5 % (ref 0.0–2.0)
EOS%: 1.3 % (ref 0.0–7.0)
Eosinophils Absolute: 0.1 10*3/uL (ref 0.0–0.5)
HCT: 38.6 % (ref 34.8–46.6)
HEMOGLOBIN: 12.7 g/dL (ref 11.6–15.9)
LYMPH#: 1.9 10*3/uL (ref 0.9–3.3)
LYMPH%: 31.4 % (ref 14.0–48.0)
MCH: 30.2 pg (ref 26.0–34.0)
MCHC: 32.9 g/dL (ref 32.0–36.0)
MCV: 92 fL (ref 81–101)
MONO#: 0.6 10*3/uL (ref 0.1–0.9)
MONO%: 9.9 % (ref 0.0–13.0)
NEUT%: 56.9 % (ref 39.6–80.0)
NEUTROS ABS: 3.5 10*3/uL (ref 1.5–6.5)
Platelets: 189 10*3/uL (ref 145–400)
RBC: 4.2 10*6/uL (ref 3.70–5.32)
RDW: 12.9 % (ref 11.1–15.7)
WBC: 6.1 10*3/uL (ref 3.9–10.0)

## 2017-11-09 LAB — CMP (CANCER CENTER ONLY)
ALBUMIN: 3.7 g/dL (ref 3.3–5.5)
ALT: 23 U/L (ref 10–47)
AST: 22 U/L (ref 11–38)
Alkaline Phosphatase: 57 U/L (ref 26–84)
BILIRUBIN TOTAL: 0.8 mg/dL (ref 0.20–1.60)
BUN, Bld: 23 mg/dL — ABNORMAL HIGH (ref 7–22)
CO2: 28 meq/L (ref 18–33)
CREATININE: 1.1 mg/dL (ref 0.6–1.2)
Calcium: 9.4 mg/dL (ref 8.0–10.3)
Chloride: 104 mEq/L (ref 98–108)
Glucose, Bld: 98 mg/dL (ref 73–118)
Potassium: 3.9 mEq/L (ref 3.3–4.7)
SODIUM: 146 meq/L — AB (ref 128–145)
TOTAL PROTEIN: 7 g/dL (ref 6.4–8.1)

## 2017-11-09 MED ORDER — AZITHROMYCIN 250 MG PO TABS: ORAL_TABLET | ORAL | 0 refills | Status: DC

## 2017-11-09 MED FILL — AZITHROMYCIN 250 MG TABLET: 250 | 5 days supply | Qty: 6 | Fill #0
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
## 2017-11-10 LAB — VITAMIN D 25 HYDROXY (VIT D DEFICIENCY, FRACTURES): VIT D 25 HYDROXY: 34.9 ng/mL (ref 30.0–100.0)

## 2017-11-17 DIAGNOSIS — R55 Syncope and collapse: Secondary | ICD-10-CM

## 2017-11-17 HISTORY — PX: COLONOSCOPY: SHX174

## 2017-11-17 HISTORY — DX: Syncope and collapse: R55

## 2017-11-30 DIAGNOSIS — J069 Acute upper respiratory infection, unspecified: Secondary | ICD-10-CM | POA: Diagnosis not present

## 2017-12-04 DIAGNOSIS — M79672 Pain in left foot: Secondary | ICD-10-CM | POA: Diagnosis not present

## 2017-12-04 DIAGNOSIS — M2042 Other hammer toe(s) (acquired), left foot: Secondary | ICD-10-CM | POA: Diagnosis not present

## 2017-12-04 DIAGNOSIS — M21611 Bunion of right foot: Secondary | ICD-10-CM | POA: Diagnosis not present

## 2017-12-04 DIAGNOSIS — M21612 Bunion of left foot: Secondary | ICD-10-CM | POA: Diagnosis not present

## 2017-12-22 DIAGNOSIS — H25811 Combined forms of age-related cataract, right eye: Secondary | ICD-10-CM | POA: Diagnosis not present

## 2017-12-22 DIAGNOSIS — H25041 Posterior subcapsular polar age-related cataract, right eye: Secondary | ICD-10-CM | POA: Diagnosis not present

## 2017-12-22 DIAGNOSIS — H2511 Age-related nuclear cataract, right eye: Secondary | ICD-10-CM | POA: Diagnosis not present

## 2017-12-29 DIAGNOSIS — H25812 Combined forms of age-related cataract, left eye: Secondary | ICD-10-CM | POA: Diagnosis not present

## 2017-12-29 DIAGNOSIS — H2512 Age-related nuclear cataract, left eye: Secondary | ICD-10-CM | POA: Diagnosis not present

## 2017-12-29 DIAGNOSIS — H25042 Posterior subcapsular polar age-related cataract, left eye: Secondary | ICD-10-CM | POA: Diagnosis not present

## 2018-01-13 DIAGNOSIS — M79675 Pain in left toe(s): Secondary | ICD-10-CM | POA: Diagnosis not present

## 2018-01-13 DIAGNOSIS — L03032 Cellulitis of left toe: Secondary | ICD-10-CM | POA: Diagnosis not present

## 2018-01-13 DIAGNOSIS — M2042 Other hammer toe(s) (acquired), left foot: Secondary | ICD-10-CM | POA: Diagnosis not present

## 2018-01-13 DIAGNOSIS — M21612 Bunion of left foot: Secondary | ICD-10-CM | POA: Diagnosis not present

## 2018-01-19 DIAGNOSIS — N390 Urinary tract infection, site not specified: Secondary | ICD-10-CM | POA: Diagnosis not present

## 2018-01-20 DIAGNOSIS — E559 Vitamin D deficiency, unspecified: Secondary | ICD-10-CM | POA: Diagnosis not present

## 2018-01-20 DIAGNOSIS — N390 Urinary tract infection, site not specified: Secondary | ICD-10-CM | POA: Diagnosis not present

## 2018-01-20 DIAGNOSIS — N183 Chronic kidney disease, stage 3 (moderate): Secondary | ICD-10-CM | POA: Diagnosis not present

## 2018-01-20 DIAGNOSIS — E1122 Type 2 diabetes mellitus with diabetic chronic kidney disease: Secondary | ICD-10-CM | POA: Diagnosis not present

## 2018-01-20 DIAGNOSIS — E785 Hyperlipidemia, unspecified: Secondary | ICD-10-CM | POA: Diagnosis not present

## 2018-01-20 DIAGNOSIS — I10 Essential (primary) hypertension: Secondary | ICD-10-CM | POA: Diagnosis not present

## 2018-01-25 DIAGNOSIS — Z853 Personal history of malignant neoplasm of breast: Secondary | ICD-10-CM | POA: Diagnosis not present

## 2018-01-25 DIAGNOSIS — Z1231 Encounter for screening mammogram for malignant neoplasm of breast: Secondary | ICD-10-CM | POA: Diagnosis not present

## 2018-01-26 DIAGNOSIS — Z853 Personal history of malignant neoplasm of breast: Secondary | ICD-10-CM | POA: Diagnosis not present

## 2018-01-26 DIAGNOSIS — E7800 Pure hypercholesterolemia, unspecified: Secondary | ICD-10-CM | POA: Diagnosis not present

## 2018-01-26 DIAGNOSIS — Z0000 Encounter for general adult medical examination without abnormal findings: Secondary | ICD-10-CM | POA: Diagnosis not present

## 2018-01-26 DIAGNOSIS — E119 Type 2 diabetes mellitus without complications: Secondary | ICD-10-CM | POA: Diagnosis not present

## 2018-01-26 DIAGNOSIS — M859 Disorder of bone density and structure, unspecified: Secondary | ICD-10-CM | POA: Diagnosis not present

## 2018-01-26 DIAGNOSIS — M8580 Other specified disorders of bone density and structure, unspecified site: Secondary | ICD-10-CM | POA: Diagnosis not present

## 2018-01-26 DIAGNOSIS — I10 Essential (primary) hypertension: Secondary | ICD-10-CM | POA: Diagnosis not present

## 2018-01-27 DIAGNOSIS — L03032 Cellulitis of left toe: Secondary | ICD-10-CM | POA: Diagnosis not present

## 2018-02-12 DIAGNOSIS — H35371 Puckering of macula, right eye: Secondary | ICD-10-CM | POA: Diagnosis not present

## 2018-02-18 DIAGNOSIS — M1711 Unilateral primary osteoarthritis, right knee: Secondary | ICD-10-CM | POA: Diagnosis not present

## 2018-02-18 DIAGNOSIS — M1712 Unilateral primary osteoarthritis, left knee: Secondary | ICD-10-CM | POA: Diagnosis not present

## 2018-02-18 DIAGNOSIS — M1990 Unspecified osteoarthritis, unspecified site: Secondary | ICD-10-CM | POA: Diagnosis not present

## 2018-02-18 DIAGNOSIS — M5136 Other intervertebral disc degeneration, lumbar region: Secondary | ICD-10-CM | POA: Diagnosis not present

## 2018-03-09 ENCOUNTER — Encounter: Payer: Self-pay | Admitting: Gastroenterology

## 2018-03-15 ENCOUNTER — Telehealth: Payer: Self-pay | Admitting: Gastroenterology

## 2018-03-15 NOTE — Telephone Encounter (Signed)
Dr. Silverio Decamp will accept this patient?

## 2018-03-15 NOTE — Telephone Encounter (Signed)
OK 

## 2018-03-16 ENCOUNTER — Encounter: Payer: Self-pay | Admitting: Gastroenterology

## 2018-03-16 NOTE — Telephone Encounter (Signed)
ok 

## 2018-03-16 NOTE — Telephone Encounter (Signed)
Pt is scheduled for colon on 06-02-18 and previsit on 05-19-18 with Dr. Clementeen Hoof mailed and patient notified

## 2018-03-22 DIAGNOSIS — Z85828 Personal history of other malignant neoplasm of skin: Secondary | ICD-10-CM | POA: Diagnosis not present

## 2018-03-22 DIAGNOSIS — L905 Scar conditions and fibrosis of skin: Secondary | ICD-10-CM | POA: Diagnosis not present

## 2018-03-22 DIAGNOSIS — L821 Other seborrheic keratosis: Secondary | ICD-10-CM | POA: Diagnosis not present

## 2018-04-26 DIAGNOSIS — J069 Acute upper respiratory infection, unspecified: Secondary | ICD-10-CM | POA: Diagnosis not present

## 2018-04-26 DIAGNOSIS — I129 Hypertensive chronic kidney disease with stage 1 through stage 4 chronic kidney disease, or unspecified chronic kidney disease: Secondary | ICD-10-CM | POA: Diagnosis not present

## 2018-04-26 DIAGNOSIS — J0100 Acute maxillary sinusitis, unspecified: Secondary | ICD-10-CM | POA: Diagnosis not present

## 2018-04-26 DIAGNOSIS — E785 Hyperlipidemia, unspecified: Secondary | ICD-10-CM | POA: Diagnosis not present

## 2018-04-26 DIAGNOSIS — E1122 Type 2 diabetes mellitus with diabetic chronic kidney disease: Secondary | ICD-10-CM | POA: Diagnosis not present

## 2018-04-26 DIAGNOSIS — J309 Allergic rhinitis, unspecified: Secondary | ICD-10-CM | POA: Diagnosis not present

## 2018-05-05 DIAGNOSIS — R0989 Other specified symptoms and signs involving the circulatory and respiratory systems: Secondary | ICD-10-CM | POA: Diagnosis not present

## 2018-05-05 DIAGNOSIS — I129 Hypertensive chronic kidney disease with stage 1 through stage 4 chronic kidney disease, or unspecified chronic kidney disease: Secondary | ICD-10-CM | POA: Diagnosis not present

## 2018-05-10 ENCOUNTER — Encounter: Payer: Self-pay | Admitting: Family

## 2018-05-10 ENCOUNTER — Inpatient Hospital Stay: Payer: Medicare Other | Attending: Family | Admitting: Family

## 2018-05-10 ENCOUNTER — Inpatient Hospital Stay: Payer: Medicare Other

## 2018-05-10 ENCOUNTER — Other Ambulatory Visit: Payer: Self-pay

## 2018-05-10 VITALS — BP 208/90 | HR 51 | Temp 98.3°F | Resp 16 | Wt 196.0 lb

## 2018-05-10 DIAGNOSIS — T50905A Adverse effect of unspecified drugs, medicaments and biological substances, initial encounter: Principal | ICD-10-CM

## 2018-05-10 DIAGNOSIS — Z853 Personal history of malignant neoplasm of breast: Secondary | ICD-10-CM | POA: Diagnosis not present

## 2018-05-10 DIAGNOSIS — C50911 Malignant neoplasm of unspecified site of right female breast: Secondary | ICD-10-CM

## 2018-05-10 DIAGNOSIS — I890 Lymphedema, not elsewhere classified: Secondary | ICD-10-CM

## 2018-05-10 DIAGNOSIS — I10 Essential (primary) hypertension: Secondary | ICD-10-CM

## 2018-05-10 DIAGNOSIS — M810 Age-related osteoporosis without current pathological fracture: Secondary | ICD-10-CM | POA: Insufficient documentation

## 2018-05-10 DIAGNOSIS — Z170 Estrogen receptor positive status [ER+]: Secondary | ICD-10-CM

## 2018-05-10 DIAGNOSIS — M818 Other osteoporosis without current pathological fracture: Secondary | ICD-10-CM

## 2018-05-10 DIAGNOSIS — E559 Vitamin D deficiency, unspecified: Secondary | ICD-10-CM

## 2018-05-10 LAB — CBC WITH DIFFERENTIAL (CANCER CENTER ONLY)
BASOS ABS: 0 10*3/uL (ref 0.0–0.1)
Basophils Relative: 0 %
EOS ABS: 0.1 10*3/uL (ref 0.0–0.5)
Eosinophils Relative: 2 %
HCT: 39.7 % (ref 34.8–46.6)
HEMOGLOBIN: 12.7 g/dL (ref 11.6–15.9)
LYMPHS ABS: 1.9 10*3/uL (ref 0.9–3.3)
Lymphocytes Relative: 34 %
MCH: 29.5 pg (ref 26.0–34.0)
MCHC: 32 g/dL (ref 32.0–36.0)
MCV: 92.3 fL (ref 81.0–101.0)
Monocytes Absolute: 0.5 10*3/uL (ref 0.1–0.9)
Monocytes Relative: 8 %
NEUTROS PCT: 56 %
Neutro Abs: 3.3 10*3/uL (ref 1.5–6.5)
Platelet Count: 171 10*3/uL (ref 145–400)
RBC: 4.3 MIL/uL (ref 3.70–5.32)
RDW: 12.9 % (ref 11.1–15.7)
WBC: 5.8 10*3/uL (ref 3.9–10.0)

## 2018-05-10 LAB — CMP (CANCER CENTER ONLY)
ALBUMIN: 3.7 g/dL (ref 3.5–5.0)
ALK PHOS: 55 U/L (ref 26–84)
ALT: 21 U/L (ref 10–47)
ANION GAP: 10 (ref 5–15)
AST: 23 U/L (ref 11–38)
BUN: 16 mg/dL (ref 7–22)
CALCIUM: 9.4 mg/dL (ref 8.0–10.3)
CHLORIDE: 106 mmol/L (ref 98–108)
CO2: 29 mmol/L (ref 18–33)
CREATININE: 1 mg/dL (ref 0.60–1.20)
GLUCOSE: 104 mg/dL (ref 73–118)
Potassium: 4.2 mmol/L (ref 3.3–4.7)
SODIUM: 145 mmol/L (ref 128–145)
Total Bilirubin: 1 mg/dL (ref 0.2–1.6)
Total Protein: 7 g/dL (ref 6.4–8.1)

## 2018-05-10 MED ORDER — ZOLEDRONIC ACID 4 MG/100ML IV SOLN
4.0000 mg | Freq: Once | INTRAVENOUS | Status: AC
  Administered 2018-05-10: 4 mg via INTRAVENOUS
  Filled 2018-05-10: qty 100

## 2018-05-10 MED ORDER — SODIUM CHLORIDE 0.9 % IV SOLN
INTRAVENOUS | Status: DC
  Administered 2018-05-10: 12:00:00 via INTRAVENOUS

## 2018-05-10 NOTE — Patient Instructions (Signed)

## 2018-05-10 NOTE — Progress Notes (Signed)
Hematology and Oncology Follow Up Visit  Jennifer Yang [id number] [birth year] 77 y.o. 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 She is having problems with hypertension and manual blood pressure today was 192/80. She states that she is nervous as well when she comes to her visit here.  Her PCP is aware and increased her Cozaar to 50 mg PO daily last week. She follows up with them again next week.  Her only complaint at this time os intermittent headaches.  No fever, chills, n/v, cough, rash, dizziness, blurred vision, SOB, chest pain, palpitations, abdominal pain or changes in bowel or bladder habits.  Her breast exam today was negative. The lymphedema in her right arm is stable. We will try and get her a sleeve. She is wrapping her arm every night but it would be nice to have something for her to wear for support during the day.  No lymphadenopathy noted on exam.  No episodes of bleeding, no bruising or petechiae.  No other swelling noted, no tenderness, numbness or tingling in her extremities.  She has a good appetite and is staying well hydrated. Her weight is stable.   ECOG Performance Status: 1 - Symptomatic but completely ambulatory  Medications:  Allergies as of 05/10/2018      Reactions   Ceftriaxone    Codeine    Codeine Sulfate Swelling   Fish-derived Products Other (See Comments)   Cellulitis    Rocephin [ceftriaxone Sodium] Other (See Comments)   Low blood count   Sulfa Antibiotics Other (See Comments)   Mouth and lip sores      Medication List        Accurate as of 05/10/18 10:55 AM. Always use your most recent med list.          ALEVE 220 MG Caps Generic drug:  Naproxen Sodium Take 220 mg by  mouth 3 (three) times daily as needed (pain).   ALPRAZolam 0.5 MG tablet Commonly known as:  XANAX Take 0.5 mg by mouth as needed for anxiety.   alum & mag hydroxide-simeth 200-200-20 MG/5ML suspension Commonly known as:  MAALOX/MYLANTA Take 30 mLs by mouth every 6 (six) hours as needed for indigestion or heartburn.   aspirin 81 MG EC tablet Take 81 mg by mouth at bedtime.   azithromycin 250 MG tablet Commonly known as:  ZITHROMAX Z-PAK Take as directed on package.   azithromycin 250 MG tablet Commonly known as:  ZITHROMAX Z-PAK Take as directed   CALTRATE 600+D PLUS MINERALS 600-800 MG-UNIT Chew Caltrate 600 + D   CALTRATE 600+D PO Take 1 tablet by mouth daily.   cephALEXin 500 MG capsule Commonly known as:  KEFLEX Take 1 capsule (500 mg total) by mouth 2 (two) times daily.   cetirizine 10 MG tablet Commonly known as:  ZYRTEC Take 1 tablet (10 mg total) by mouth daily. 1 po q day prn allergies   chlorhexidine 0.12 % solution Commonly known as:  PERIDEX chlorhexidine gluconate 0.12 % mouthwash   DUREZOL 0.05 % Emul Generic drug:  Difluprednate INSTILL 1 DROP INTO RIGHT EYE 4 TIMES A DAY   famotidine 20 MG tablet Commonly known as:  PEPCID Take 1 tablet (20  mg total) by mouth 2 (two) times daily.   fluticasone 50 MCG/ACT nasal spray Commonly known as:  FLONASE Place 1-2 sprays into both nostrils daily as needed for allergies or rhinitis.   gatifloxacin 0.5 % Soln Commonly known as:  ZYMAXID   losartan 25 MG tablet Commonly known as:  COZAAR Take 25 mg by mouth daily.   meclizine 25 MG tablet Commonly known as:  ANTIVERT Take 25 mg by mouth 3 (three) times daily as needed for dizziness.   naproxen 375 MG tablet Commonly known as:  NAPROSYN Take 1 tablet (375 mg total) by mouth 2 (two) times daily.   oseltamivir 75 MG capsule Commonly known as:  TAMIFLU oseltamivir 75 mg capsule   predniSONE 20 MG tablet Commonly known as:  DELTASONE Take 1 tablet  (20 mg total) by mouth 2 (two) times daily with a meal.   promethazine 25 MG tablet Commonly known as:  PHENERGAN promethazine 25 mg tablet   rosuvastatin 5 MG tablet Commonly known as:  CRESTOR Take 5 mg by mouth daily.   Vitamin D 1000 units capsule Take 2,000 Units by mouth daily.   ZOMETA IV Zometa       Allergies:  Allergies  Allergen Reactions  . Ceftriaxone   . Codeine   . Codeine Sulfate Swelling  . Fish-Derived Products Other (See Comments)    Cellulitis   . Rocephin [Ceftriaxone Sodium] Other (See Comments)    Low blood count  . Sulfa Antibiotics Other (See Comments)    Mouth and lip sores    Past Medical History, Surgical history, Social history, and Family History were reviewed and updated.  Review of Systems: All other 10 point review of systems is negative.   Physical Exam:  vitals were not taken for this visit.   Wt Readings from Last 3 Encounters:  11/09/17 197 lb (89.4 kg)  04/27/17 193 lb (87.5 kg)  11/15/16 192 lb (87.1 kg)    Ocular: Sclerae unicteric, pupils equal, round and reactive to light Ear-nose-throat: Oropharynx clear, dentition fair Lymphatic: No cervical, supraclavicular or axillary adenopathy Lungs no rales or rhonchi, good excursion bilaterally Heart regular rate and rhythm, no murmur appreciated Abd soft, nontender, positive bowel sounds, no liver or spleen tip palpated on exam, no fluid wave  MSK no focal spinal tenderness, no joint edema Neuro: non-focal, well-oriented, appropriate affect Breasts: No changes. Right mastectomy intact, no changes with the left breast. No mass, lesion or rash, noted.    Lab Results  Component Value Date   WBC 5.8 05/10/2018   HGB 12.7 05/10/2018   HCT 39.7 05/10/2018   MCV 92.3 05/10/2018   PLT 171 05/10/2018   No results found for: FERRITIN, IRON, TIBC, UIBC, IRONPCTSAT Lab Results  Component Value Date   RBC 4.30 05/10/2018   No results found for: KPAFRELGTCHN, LAMBDASER,  KAPLAMBRATIO No results found for: IGGSERUM, IGA, IGMSERUM No results found for: Kathrynn Ducking, MSPIKE, SPEI   Chemistry      Component Value Date/Time   NA 146 (H) 11/09/2017 1135   NA 144 05/24/2013 0959   K 3.9 11/09/2017 1135   K 3.9 05/24/2013 0959   CL 104 11/09/2017 1135   CO2 28 11/09/2017 1135   CO2 27 05/24/2013 0959   BUN 23 (H) 11/09/2017 1135   BUN 19.7 05/24/2013 0959   CREATININE 1.1 11/09/2017 1135   CREATININE 1.1 05/24/2013 0959      Component Value Date/Time   CALCIUM 9.4  11/09/2017 1135   CALCIUM 9.8 05/24/2013 0959   ALKPHOS 57 11/09/2017 1135   AST 22 11/09/2017 1135   ALT 23 11/09/2017 1135   BILITOT 0.80 11/09/2017 1135      Impression and Plan: Jennifer Yang is a very pleasant 77 yo caucasian female with history of 2 separate primary breast cancers, both stage 1 and ER positive. She continues to do well and so far there has been no evidence of recurrence.  We will order her a compression sleeve for the right arm.  She received her Zometa today as planned.  We will see her back again in another year for follow-up.  They will contact our office with any questions or concerns. We can certainly see her sooner if need be.   Laverna Peace, NP 6/24/201910:55 AM

## 2018-05-11 DIAGNOSIS — F419 Anxiety disorder, unspecified: Secondary | ICD-10-CM | POA: Diagnosis not present

## 2018-05-11 DIAGNOSIS — I10 Essential (primary) hypertension: Secondary | ICD-10-CM | POA: Diagnosis not present

## 2018-05-11 LAB — VITAMIN D 25 HYDROXY (VIT D DEFICIENCY, FRACTURES): VIT D 25 HYDROXY: 38 ng/mL (ref 30.0–100.0)

## 2018-05-19 ENCOUNTER — Telehealth: Payer: Self-pay | Admitting: *Deleted

## 2018-05-19 ENCOUNTER — Other Ambulatory Visit: Payer: Self-pay

## 2018-05-19 ENCOUNTER — Ambulatory Visit (AMBULATORY_SURGERY_CENTER): Payer: Self-pay | Admitting: *Deleted

## 2018-05-19 VITALS — Ht 69.0 in | Wt 196.4 lb

## 2018-05-19 DIAGNOSIS — I129 Hypertensive chronic kidney disease with stage 1 through stage 4 chronic kidney disease, or unspecified chronic kidney disease: Secondary | ICD-10-CM | POA: Diagnosis not present

## 2018-05-19 DIAGNOSIS — R300 Dysuria: Secondary | ICD-10-CM | POA: Diagnosis not present

## 2018-05-19 DIAGNOSIS — Z86010 Personal history of colonic polyps: Secondary | ICD-10-CM
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
## 2018-05-19 NOTE — Progress Notes (Signed)
No egg or soy allergy known to patient  No issues with past sedation with any surgeries  or procedures, no intubation problems  No diet pills per patient No home 02 use per patient  No blood thinners per patient  Pt denies issues with constipation  No A fib or A flutter  EMMI video offered and patient declined  Patient stating on last Colonoscopy patient was admitted to the hospital with dehydration and abdominal pain. Telephone note sent to Dr. Silverio Decamp requesting additional IV hydration for patient on day of procedure.

## 2018-05-21 MED ORDER — ONDANSETRON HCL 4 MG PO TABS: 4.0000 mg | ORAL_TABLET | Freq: Three times a day (TID) | ORAL | 0 refills | Status: DC | PRN

## 2018-05-21 NOTE — Telephone Encounter (Signed)
Called pt - informed per Dr Silverio Decamp will call in Zofran to use 30 minutes before each prep dose- instructed times of 430 pm day before and 3 am day of colon - to drink lots of liquids thru a straw, take her time with the prep. Pt states she did not vomit last prep at all- but ok'd calling in the Zofran.   Call with questions Zofran sent to pharmacy  Lelan Pons

## 2018-05-21 NOTE — Telephone Encounter (Signed)
Yes agree with IV fluid management. Please send Rx for Zofran 4mg  every 8 hours as needed X 5 tabs to take during prep. Encourage patient to drink clear liquids as much as tolerated and drink prep slowly with a straw. Thank you

## 2018-06-02 ENCOUNTER — Encounter: Payer: Medicare HMO | Admitting: Gastroenterology

## 2018-06-02 ENCOUNTER — Telehealth: Payer: Self-pay | Admitting: *Deleted

## 2018-06-02 DIAGNOSIS — I129 Hypertensive chronic kidney disease with stage 1 through stage 4 chronic kidney disease, or unspecified chronic kidney disease: Secondary | ICD-10-CM | POA: Diagnosis not present

## 2018-06-02 DIAGNOSIS — Z87440 Personal history of urinary (tract) infections: Secondary | ICD-10-CM | POA: Diagnosis not present

## 2018-06-02 NOTE — Telephone Encounter (Signed)
Zofran prior auth approved 03/04/2018-06/02/2019

## 2018-06-07 DIAGNOSIS — M810 Age-related osteoporosis without current pathological fracture: Secondary | ICD-10-CM | POA: Diagnosis not present

## 2018-06-07 DIAGNOSIS — I10 Essential (primary) hypertension: Secondary | ICD-10-CM | POA: Diagnosis not present

## 2018-06-09 ENCOUNTER — Ambulatory Visit (AMBULATORY_SURGERY_CENTER): Payer: Medicare Other | Admitting: Gastroenterology

## 2018-06-09 ENCOUNTER — Encounter: Payer: Self-pay | Admitting: Gastroenterology

## 2018-06-09 VITALS — BP 144/60 | HR 76 | Temp 96.7°F | Resp 11 | Ht 69.0 in | Wt 196.0 lb

## 2018-06-09 DIAGNOSIS — Z86010 Personal history of colonic polyps: Secondary | ICD-10-CM

## 2018-06-09 DIAGNOSIS — Z1211 Encounter for screening for malignant neoplasm of colon: Secondary | ICD-10-CM | POA: Diagnosis not present

## 2018-06-09 DIAGNOSIS — D123 Benign neoplasm of transverse colon: Secondary | ICD-10-CM | POA: Diagnosis not present

## 2018-06-09 DIAGNOSIS — K529 Noninfective gastroenteritis and colitis, unspecified: Secondary | ICD-10-CM

## 2018-06-09 DIAGNOSIS — D122 Benign neoplasm of ascending colon: Secondary | ICD-10-CM

## 2018-06-09 DIAGNOSIS — K559 Vascular disorder of intestine, unspecified: Secondary | ICD-10-CM | POA: Diagnosis not present

## 2018-06-09 DIAGNOSIS — D120 Benign neoplasm of cecum: Secondary | ICD-10-CM

## 2018-06-09 MED ORDER — SODIUM CHLORIDE 0.9 % IV SOLN: 500.0000 mL | Freq: Once | INTRAVENOUS | Status: DC

## 2018-06-09 NOTE — Progress Notes (Signed)
Called to room to assist during endoscopic procedure.  Patient ID and intended procedure confirmed with present staff. Received instructions for my participation in the procedure from the performing physician.  

## 2018-06-09 NOTE — Patient Instructions (Signed)
Handout given on polyps and diverticulosis  YOU HAD AN ENDOSCOPIC PROCEDURE TODAY AT [facility]:   Refer to the procedure report that was given to you for any specific questions about what was found during the examination.  If the procedure report does not answer your questions, please call your gastroenterologist to clarify.  If you requested that your care partner not be given the details of your procedure findings, then the procedure report has been included in a sealed envelope for you to review at your convenience later.  YOU SHOULD EXPECT: Some feelings of bloating in the abdomen. Passage of more gas than usual.  Walking can help get rid of the air that was put into your GI tract during the procedure and reduce the bloating. If you had a lower endoscopy (such as a colonoscopy or flexible sigmoidoscopy) you may notice spotting of blood in your stool or on the toilet paper. If you underwent a bowel prep for your procedure, you may not have a normal bowel movement for a few days.  Please Note:  You might notice some irritation and congestion in your nose or some drainage.  This is from the oxygen used during your procedure.  There is no need for concern and it should clear up in a day or so.  SYMPTOMS TO REPORT IMMEDIATELY:   Following lower endoscopy (colonoscopy or flexible sigmoidoscopy):  Excessive amounts of blood in the stool  Significant tenderness or worsening of abdominal pains  Swelling of the abdomen that is new, acute  Fever of 100F or higher  For urgent or emergent issues, a gastroenterologist can be reached at any hour by calling [phone].   DIET:  We do recommend a small meal at first, but then you may proceed to your regular diet.  Drink plenty of fluids but you should avoid alcoholic beverages for 24 hours.  ACTIVITY:  You should plan to take it easy for the rest of today and you should NOT DRIVE or use heavy machinery until tomorrow (because of the  sedation medicines used during the test).    FOLLOW UP: Our staff will call the number listed on your records the next business day following your procedure to check on you and address any questions or concerns that you may have regarding the information given to you following your procedure. If we do not reach you, we will leave a message.  However, if you are feeling well and you are not experiencing any problems, there is no need to return our call.  We will assume that you have returned to your regular daily activities without incident.  If any biopsies were taken you will be contacted by phone or by letter within the next 1-3 weeks.  Please call us at [phone] if you have not heard about the biopsies in 3 weeks.    SIGNATURES/CONFIDENTIALITY: You and/or your care partner have signed paperwork which will be entered into your electronic medical record.  These signatures attest to the fact that that the information above on your After Visit Summary has been reviewed and is understood.  Full responsibility of the confidentiality of this discharge information lies with you and/or your care-partner.

## 2018-06-09 NOTE — Progress Notes (Signed)
Pt's states no medical or surgical changes since previsit or office visit. 

## 2018-06-09 NOTE — Op Note (Signed)
[facility] Patient Name: Jennifer Yang Procedure Date: 06/09/2018 9:54 AM MRN: [id number] Endoscopist: Mauri Pole , MD Age: 77 Referring MD:  Date of Birth: [birth year] Gender: Female Account #: [id number] Procedure:                Colonoscopy Indications:              High risk colon cancer surveillance: Personal                            history of colonic polyps, High risk colon cancer                            surveillance: Personal history of multiple (3 or                            more) adenomas Medicines:                Monitored Anesthesia Care Procedure:                Pre-Anesthesia Assessment:                           - Prior to the procedure, a History and Physical                            was performed, and patient medications and                            allergies were reviewed. The patient's tolerance of                            previous anesthesia was also reviewed. The risks                            and benefits of the procedure and the sedation                            options and risks were discussed with the patient.                            All questions were answered, and informed consent                            was obtained. Prior Anticoagulants: The patient has                            taken no previous anticoagulant or antiplatelet                            agents. ASA Grade Assessment: III - A patient with                            severe systemic disease. After reviewing the risks  and benefits, the patient was deemed in                            satisfactory condition to undergo the procedure.                           After obtaining informed consent, the colonoscope                            was passed under direct vision. Throughout the                            procedure, the patient's blood pressure, pulse, and                            oxygen saturations were monitored  continuously. The                            Colonoscope was introduced through the anus and                            advanced to the the cecum, identified by                            appendiceal orifice and ileocecal valve. The                            colonoscopy was performed without difficulty. The                            patient tolerated the procedure well. The quality                            of the bowel preparation was good. The ileocecal                            valve, appendiceal orifice, and rectum were                            photographed. Scope In: 10:04:22 AM Scope Out: 10:37:44 AM Scope Withdrawal Time: 0 hours 26 minutes 19 seconds  Total Procedure Duration: 0 hours 33 minutes 22 seconds  Findings:                 The perianal and digital rectal examinations were                            normal.                           Two carpet-like polyps were found in the transverse                            colon and cecum. The polyps were 15 to 18 mm in  size. These polyps were removed with a piecemeal                            technique using a cold snare. Resection and                            retrieval were complete.                           Three sessile polyps were found in the ascending                            colon. The polyps were 1 to 2 mm in size. These                            polyps were removed with a cold biopsy forceps.                            Resection and retrieval were complete.                           Scattered small and large-mouthed diverticula were                            found in the sigmoid colon, descending colon and                            ascending colon. There was narrowing of the colon                            in association with the diverticular opening.                           A localized area of moderately erythematous mucosa                            with congestion and erosions  was found in the                            descending colon. Biopsies were taken with a cold                            forceps for histology.                           Non-bleeding internal hemorrhoids were found during                            retroflexion. The hemorrhoids were small. Complications:            No immediate complications. Estimated Blood Loss:     Estimated blood loss was minimal. Impression:               - Two 15 to 18 mm polyps in the transverse colon  and in the cecum, removed piecemeal using a cold                            snare. Resected and retrieved.                           - Three 1 to 2 mm polyps in the ascending colon,                            removed with a cold biopsy forceps. Resected and                            retrieved.                           - Moderate diverticulosis in the sigmoid colon, in                            the descending colon and in the ascending colon.                            There was narrowing of the colon in association                            with the diverticular opening.                           - Erythematous mucosa in the descending colon.                            Biopsied.                           - Non-bleeding internal hemorrhoids. Recommendation:           - Patient has a contact number available for                            emergencies. The signs and symptoms of potential                            delayed complications were discussed with the                            patient. Return to normal activities tomorrow.                            Written discharge instructions were provided to the                            patient.                           - Resume previous diet.                           -  Continue present medications.                           - No aspirin, ibuprofen, naproxen, or other                            non-steroidal anti-inflammatory drugs for 2  weeks.                           - Await pathology results.                           - Repeat colonoscopy date to be determined after                            pending pathology results are reviewed for                            surveillance based on pathology results. Mauri Pole, MD 06/09/2018 10:49:22 AM This report has been signed electronically.

## 2018-06-09 NOTE — Progress Notes (Signed)
To PACU, VSS. Report to [email or web address] 

## 2018-06-10 ENCOUNTER — Telehealth: Payer: Self-pay | Admitting: *Deleted

## 2018-06-10 DIAGNOSIS — R911 Solitary pulmonary nodule: Secondary | ICD-10-CM | POA: Diagnosis not present

## 2018-06-10 DIAGNOSIS — C50911 Malignant neoplasm of unspecified site of right female breast: Secondary | ICD-10-CM | POA: Diagnosis not present

## 2018-06-10 NOTE — Telephone Encounter (Signed)
  Follow up Call-  Call back number 06/09/2018  Post procedure Call Back phone  # [phone]  Permission to leave phone message Yes  Some recent data might be hidden     Patient questions:  Do you have a fever, pain , or abdominal swelling? No. Pain Score  0 *  Have you tolerated food without any problems? Yes.    Have you been able to return to your normal activities? Yes.    Do you have any questions about your discharge instructions: Diet   No. Medications  No. Follow up visit  No.  Do you have questions or concerns about your Care? No.  Actions: * If pain score is 4 or above: No action needed, pain <4.

## 2018-06-15 ENCOUNTER — Encounter: Payer: Self-pay | Admitting: Gastroenterology

## 2018-06-17 DIAGNOSIS — M1711 Unilateral primary osteoarthritis, right knee: Secondary | ICD-10-CM | POA: Diagnosis not present

## 2018-07-01 ENCOUNTER — Encounter: Payer: Self-pay | Admitting: Genetics

## 2018-07-22 DIAGNOSIS — E7800 Pure hypercholesterolemia, unspecified: Secondary | ICD-10-CM | POA: Diagnosis not present

## 2018-07-22 DIAGNOSIS — E119 Type 2 diabetes mellitus without complications: Secondary | ICD-10-CM | POA: Diagnosis not present

## 2018-08-05 DIAGNOSIS — R8290 Unspecified abnormal findings in urine: Secondary | ICD-10-CM | POA: Diagnosis not present

## 2018-08-05 DIAGNOSIS — Z23 Encounter for immunization: Secondary | ICD-10-CM | POA: Diagnosis not present

## 2018-08-05 DIAGNOSIS — H35371 Puckering of macula, right eye: Secondary | ICD-10-CM | POA: Diagnosis not present

## 2018-08-05 DIAGNOSIS — E7800 Pure hypercholesterolemia, unspecified: Secondary | ICD-10-CM | POA: Diagnosis not present

## 2018-08-05 DIAGNOSIS — I10 Essential (primary) hypertension: Secondary | ICD-10-CM | POA: Diagnosis not present

## 2018-08-05 DIAGNOSIS — E119 Type 2 diabetes mellitus without complications: Secondary | ICD-10-CM | POA: Diagnosis not present

## 2018-08-10 DIAGNOSIS — Z87440 Personal history of urinary (tract) infections: Secondary | ICD-10-CM | POA: Diagnosis not present

## 2018-08-10 DIAGNOSIS — J0100 Acute maxillary sinusitis, unspecified: Secondary | ICD-10-CM | POA: Diagnosis not present

## 2018-08-10 DIAGNOSIS — J309 Allergic rhinitis, unspecified: Secondary | ICD-10-CM | POA: Diagnosis not present

## 2018-08-10 DIAGNOSIS — E1122 Type 2 diabetes mellitus with diabetic chronic kidney disease: Secondary | ICD-10-CM | POA: Diagnosis not present

## 2018-08-12 DIAGNOSIS — N390 Urinary tract infection, site not specified: Secondary | ICD-10-CM | POA: Diagnosis not present

## 2018-08-16 DIAGNOSIS — D1801 Hemangioma of skin and subcutaneous tissue: Secondary | ICD-10-CM | POA: Diagnosis not present

## 2018-08-16 DIAGNOSIS — L821 Other seborrheic keratosis: Secondary | ICD-10-CM | POA: Diagnosis not present

## 2018-08-16 DIAGNOSIS — Z85828 Personal history of other malignant neoplasm of skin: Secondary | ICD-10-CM | POA: Diagnosis not present

## 2018-08-16 DIAGNOSIS — D229 Melanocytic nevi, unspecified: Secondary | ICD-10-CM | POA: Diagnosis not present

## 2018-08-16 DIAGNOSIS — D485 Neoplasm of uncertain behavior of skin: Secondary | ICD-10-CM | POA: Diagnosis not present

## 2018-08-16 DIAGNOSIS — L814 Other melanin hyperpigmentation: Secondary | ICD-10-CM | POA: Diagnosis not present

## 2018-08-16 DIAGNOSIS — C44519 Basal cell carcinoma of skin of other part of trunk: Secondary | ICD-10-CM | POA: Diagnosis not present

## 2018-08-25 DIAGNOSIS — R351 Nocturia: Secondary | ICD-10-CM | POA: Diagnosis not present

## 2018-08-25 DIAGNOSIS — N3020 Other chronic cystitis without hematuria: Secondary | ICD-10-CM | POA: Diagnosis not present

## 2018-09-02 DIAGNOSIS — C44519 Basal cell carcinoma of skin of other part of trunk: Secondary | ICD-10-CM | POA: Diagnosis not present

## 2018-09-06 DIAGNOSIS — I10 Essential (primary) hypertension: Secondary | ICD-10-CM | POA: Diagnosis not present

## 2018-10-18 DIAGNOSIS — N8111 Cystocele, midline: Secondary | ICD-10-CM | POA: Diagnosis not present

## 2018-10-18 DIAGNOSIS — Z01419 Encounter for gynecological examination (general) (routine) without abnormal findings: Secondary | ICD-10-CM | POA: Diagnosis not present

## 2018-11-08 ENCOUNTER — Ambulatory Visit: Payer: Medicare Other | Admitting: Hematology & Oncology

## 2018-11-08 ENCOUNTER — Other Ambulatory Visit: Payer: Medicare Other

## 2018-11-08 ENCOUNTER — Ambulatory Visit: Payer: Medicare Other

## 2018-12-08 DIAGNOSIS — M21612 Bunion of left foot: Secondary | ICD-10-CM | POA: Diagnosis not present

## 2018-12-08 DIAGNOSIS — M21611 Bunion of right foot: Secondary | ICD-10-CM | POA: Diagnosis not present

## 2018-12-08 DIAGNOSIS — M2042 Other hammer toe(s) (acquired), left foot: Secondary | ICD-10-CM | POA: Diagnosis not present

## 2018-12-08 DIAGNOSIS — L03031 Cellulitis of right toe: Secondary | ICD-10-CM | POA: Diagnosis not present

## 2019-01-05 DIAGNOSIS — M21612 Bunion of left foot: Secondary | ICD-10-CM | POA: Diagnosis not present

## 2019-01-05 DIAGNOSIS — M2042 Other hammer toe(s) (acquired), left foot: Secondary | ICD-10-CM | POA: Diagnosis not present

## 2019-01-05 DIAGNOSIS — M21611 Bunion of right foot: Secondary | ICD-10-CM | POA: Diagnosis not present

## 2019-01-05 DIAGNOSIS — M2041 Other hammer toe(s) (acquired), right foot: Secondary | ICD-10-CM | POA: Diagnosis not present

## 2019-01-25 DIAGNOSIS — I10 Essential (primary) hypertension: Secondary | ICD-10-CM | POA: Diagnosis not present

## 2019-01-25 DIAGNOSIS — E785 Hyperlipidemia, unspecified: Secondary | ICD-10-CM | POA: Diagnosis not present

## 2019-01-25 DIAGNOSIS — Z87440 Personal history of urinary (tract) infections: Secondary | ICD-10-CM | POA: Diagnosis not present

## 2019-01-25 DIAGNOSIS — N390 Urinary tract infection, site not specified: Secondary | ICD-10-CM | POA: Diagnosis not present

## 2019-01-25 DIAGNOSIS — E1121 Type 2 diabetes mellitus with diabetic nephropathy: Secondary | ICD-10-CM | POA: Diagnosis not present

## 2019-01-25 DIAGNOSIS — M810 Age-related osteoporosis without current pathological fracture: Secondary | ICD-10-CM | POA: Diagnosis not present

## 2019-01-27 DIAGNOSIS — Z853 Personal history of malignant neoplasm of breast: Secondary | ICD-10-CM | POA: Diagnosis not present

## 2019-01-27 DIAGNOSIS — Z1231 Encounter for screening mammogram for malignant neoplasm of breast: Secondary | ICD-10-CM | POA: Diagnosis not present

## 2019-02-01 DIAGNOSIS — Z0000 Encounter for general adult medical examination without abnormal findings: Secondary | ICD-10-CM | POA: Diagnosis not present

## 2019-02-01 DIAGNOSIS — I10 Essential (primary) hypertension: Secondary | ICD-10-CM | POA: Diagnosis not present

## 2019-02-01 DIAGNOSIS — N390 Urinary tract infection, site not specified: Secondary | ICD-10-CM | POA: Diagnosis not present

## 2019-02-01 DIAGNOSIS — E7800 Pure hypercholesterolemia, unspecified: Secondary | ICD-10-CM | POA: Diagnosis not present

## 2019-02-14 DIAGNOSIS — H1013 Acute atopic conjunctivitis, bilateral: Secondary | ICD-10-CM | POA: Diagnosis not present

## 2019-02-21 DIAGNOSIS — M21612 Bunion of left foot: Secondary | ICD-10-CM | POA: Diagnosis not present

## 2019-02-21 DIAGNOSIS — M2042 Other hammer toe(s) (acquired), left foot: Secondary | ICD-10-CM | POA: Diagnosis not present

## 2019-02-21 DIAGNOSIS — M21611 Bunion of right foot: Secondary | ICD-10-CM | POA: Diagnosis not present

## 2019-02-21 DIAGNOSIS — M2041 Other hammer toe(s) (acquired), right foot: Secondary | ICD-10-CM | POA: Diagnosis not present

## 2019-04-15 DIAGNOSIS — H35371 Puckering of macula, right eye: Secondary | ICD-10-CM | POA: Diagnosis not present

## 2019-04-19 DIAGNOSIS — Z85828 Personal history of other malignant neoplasm of skin: Secondary | ICD-10-CM | POA: Diagnosis not present

## 2019-04-19 DIAGNOSIS — L821 Other seborrheic keratosis: Secondary | ICD-10-CM | POA: Diagnosis not present

## 2019-04-19 DIAGNOSIS — L814 Other melanin hyperpigmentation: Secondary | ICD-10-CM | POA: Diagnosis not present

## 2019-04-19 DIAGNOSIS — D229 Melanocytic nevi, unspecified: Secondary | ICD-10-CM | POA: Diagnosis not present

## 2019-04-19 DIAGNOSIS — L905 Scar conditions and fibrosis of skin: Secondary | ICD-10-CM | POA: Diagnosis not present

## 2019-04-19 DIAGNOSIS — D1801 Hemangioma of skin and subcutaneous tissue: Secondary | ICD-10-CM | POA: Diagnosis not present

## 2019-05-05 DIAGNOSIS — Z20828 Contact with and (suspected) exposure to other viral communicable diseases: Secondary | ICD-10-CM | POA: Diagnosis not present

## 2019-05-05 DIAGNOSIS — J029 Acute pharyngitis, unspecified: Secondary | ICD-10-CM | POA: Diagnosis not present

## 2019-05-06 ENCOUNTER — Encounter: Payer: Self-pay | Admitting: Gastroenterology

## 2019-05-11 ENCOUNTER — Inpatient Hospital Stay: Payer: Medicare Other

## 2019-05-11 ENCOUNTER — Inpatient Hospital Stay: Payer: Medicare Other | Attending: Hematology & Oncology | Admitting: Hematology & Oncology

## 2019-05-11 ENCOUNTER — Other Ambulatory Visit: Payer: Self-pay

## 2019-05-11 ENCOUNTER — Encounter: Payer: Self-pay | Admitting: Hematology & Oncology

## 2019-05-11 VITALS — BP 172/60 | HR 63 | Temp 98.4°F | Resp 20 | Wt 199.1 lb

## 2019-05-11 DIAGNOSIS — Z853 Personal history of malignant neoplasm of breast: Secondary | ICD-10-CM | POA: Insufficient documentation

## 2019-05-11 DIAGNOSIS — M818 Other osteoporosis without current pathological fracture: Secondary | ICD-10-CM

## 2019-05-11 DIAGNOSIS — E559 Vitamin D deficiency, unspecified: Secondary | ICD-10-CM

## 2019-05-11 DIAGNOSIS — C50911 Malignant neoplasm of unspecified site of right female breast: Secondary | ICD-10-CM

## 2019-05-11 DIAGNOSIS — I890 Lymphedema, not elsewhere classified: Secondary | ICD-10-CM | POA: Diagnosis not present

## 2019-05-11 LAB — CBC WITH DIFFERENTIAL (CANCER CENTER ONLY)
Abs Immature Granulocytes: 0.02 10*3/uL (ref 0.00–0.07)
Basophils Absolute: 0.1 10*3/uL (ref 0.0–0.1)
Basophils Relative: 1 %
Eosinophils Absolute: 0.1 10*3/uL (ref 0.0–0.5)
Eosinophils Relative: 2 %
HCT: 38.2 % (ref 36.0–46.0)
Hemoglobin: 12.4 g/dL (ref 12.0–15.0)
Immature Granulocytes: 0 %
Lymphocytes Relative: 36 %
Lymphs Abs: 2.3 10*3/uL (ref 0.7–4.0)
MCH: 30 pg (ref 26.0–34.0)
MCHC: 32.5 g/dL (ref 30.0–36.0)
MCV: 92.5 fL (ref 80.0–100.0)
Monocytes Absolute: 0.5 10*3/uL (ref 0.1–1.0)
Monocytes Relative: 8 %
Neutro Abs: 3.4 10*3/uL (ref 1.7–7.7)
Neutrophils Relative %: 53 %
Platelet Count: 171 10*3/uL (ref 150–400)
RBC: 4.13 MIL/uL (ref 3.87–5.11)
RDW: 13 % (ref 11.5–15.5)
WBC Count: 6.3 10*3/uL (ref 4.0–10.5)
nRBC: 0 % (ref 0.0–0.2)

## 2019-05-11 LAB — CMP (CANCER CENTER ONLY)
ALT: 13 U/L (ref 0–44)
AST: 17 U/L (ref 15–41)
Albumin: 4.3 g/dL (ref 3.5–5.0)
Alkaline Phosphatase: 45 U/L (ref 38–126)
Anion gap: 9 (ref 5–15)
BUN: 13 mg/dL (ref 8–23)
CO2: 27 mmol/L (ref 22–32)
Calcium: 9.4 mg/dL (ref 8.9–10.3)
Chloride: 107 mmol/L (ref 98–111)
Creatinine: 1.1 mg/dL — ABNORMAL HIGH (ref 0.44–1.00)
GFR, Est AFR Am: 56 mL/min — ABNORMAL LOW (ref >60)
GFR, Estimated: 48 mL/min — ABNORMAL LOW (ref >60)
Glucose, Bld: 101 mg/dL — ABNORMAL HIGH (ref 70–99)
Potassium: 3.6 mmol/L (ref 3.5–5.1)
Sodium: 143 mmol/L (ref 135–145)
Total Bilirubin: 1 mg/dL (ref 0.3–1.2)
Total Protein: 6.6 g/dL (ref 6.5–8.1)

## 2019-05-11 MED ORDER — AZITHROMYCIN 250 MG PO TABS: ORAL_TABLET | ORAL | 0 refills | Status: DC

## 2019-05-11 MED ORDER — SODIUM CHLORIDE 0.9 % IV SOLN
INTRAVENOUS | Status: DC
  Administered 2019-05-11: 09:00:00 via INTRAVENOUS
  Filled 2019-05-11: qty 250

## 2019-05-11 MED ORDER — ZOLEDRONIC ACID 4 MG/100ML IV SOLN
4.0000 mg | Freq: Once | INTRAVENOUS | Status: AC
  Administered 2019-05-11: 4 mg via INTRAVENOUS
  Filled 2019-05-11: qty 100

## 2019-05-11 NOTE — Patient Instructions (Signed)

## 2019-05-11 NOTE — Progress Notes (Signed)
Hematology and Oncology Follow Up Visit  Jennifer Yang [id number] 1941/09/18 78 y.o. 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  She is doing quite good.  She is tolerating the coronavirus restrictions.  She really is not able to do all that much..  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  She is going to get some injections into her left knee later on this week.  She had a mammogram back in March.  Everything looked fine with the mammogram.  She has lymphedema of the right arm.  She does wear a compression sleeve on the arm.  The arm does not have too much problems with pain.  Just because of her some aggravation with being "heavy."  There is no change in bowel or bladder habits.  She has had no rashes.  There is been no cough or shortness of breath.  Overall, her performance status is ECOG 0.   Medications:  Allergies as of 05/11/2019      Reactions   Ceftriaxone    Codeine    Codeine Sulfate Swelling   Fish-derived Products Other (See Comments)   Cellulitis    Rocephin [ceftriaxone Sodium] Other (See Comments)   Low blood count   Sulfa Antibiotics Other (See Comments)   Mouth and lip sores      Medication List       Accurate as of May 11, 2019  8:18 AM. If you have any questions, ask your nurse or doctor.        ALPRAZolam 0.5 MG tablet Commonly known as: XANAX Take 0.5 mg by mouth as needed for anxiety.   alum & mag hydroxide-simeth 200-200-20 MG/5ML suspension Commonly known as: MAALOX/MYLANTA Take 30 mLs by mouth every 6 (six) hours as needed for indigestion or heartburn.   amLODipine 5 MG tablet Commonly known as: NORVASC Take 5 mg by mouth daily.   aspirin 81 MG EC tablet Take 81 mg by  mouth at bedtime.   Caltrate 600+D Plus Minerals 600-800 MG-UNIT Chew Caltrate 600 + D   cetirizine 10 MG tablet Commonly known as: ZYRTEC Take 1 tablet (10 mg total) by mouth daily. 1 po q day prn allergies   Cozaar 100 MG tablet Generic drug: losartan Take 100 mg by mouth daily.   Durezol 0.05 % Emul Generic drug: Difluprednate Durezol 0.05 % eye drops  INSTILL 1 DROP INTO RIGHT EYE 4 TIMES A DAY   famotidine 20 MG tablet Commonly known as: PEPCID Take 1 tablet (20 mg total) by mouth 2 (two) times daily.   fluticasone 50 MCG/ACT nasal spray Commonly known as: FLONASE Place 1-2 sprays into both nostrils daily as needed for allergies or rhinitis.   meclizine 25 MG tablet Commonly known as: ANTIVERT Take 25 mg by mouth 3 (three) times daily as needed for dizziness.   ondansetron 4 MG tablet Commonly known as: ZOFRAN Take 1 tablet (4 mg total) by mouth every 8 (eight) hours as needed for nausea or vomiting.   OSTEO BI-FLEX REGULAR STRENGTH PO Take by mouth.   promethazine 25 MG tablet Commonly known as: PHENERGAN promethazine 25 mg tablet   rosuvastatin 5 MG tablet Commonly known as: CRESTOR Take 5 mg  by mouth daily.   ZOMETA IV Zometa       Allergies:  Allergies  Allergen Reactions  . Ceftriaxone   . Codeine   . Codeine Sulfate Swelling  . Fish-Derived Products Other (See Comments)    Cellulitis   . Rocephin [Ceftriaxone Sodium] Other (See Comments)    Low blood count  . Sulfa Antibiotics Other (See Comments)    Mouth and lip sores    Past Medical History, Surgical history, Social history, and Family History were reviewed and updated.  Review of Systems: Review of Systems  Constitutional: Negative.   HENT: Negative.   Eyes: Negative.   Respiratory: Negative.   Cardiovascular: Negative.   Gastrointestinal: Negative.   Genitourinary: Negative.   Musculoskeletal: Negative.   Skin: Negative.   Neurological: Negative.   Endo/Heme/Allergies:  Negative.   Psychiatric/Behavioral: Negative.      Physical Exam:  weight is 199 lb 1.9 oz (90.3 kg). Her oral temperature is 98.4 F (36.9 C). Her blood pressure is 172/60 (abnormal) and her pulse is 63. Her respiration is 20 and oxygen saturation is 100%.   Wt Readings from Last 3 Encounters:  05/11/19 199 lb 1.9 oz (90.3 kg)  06/09/18 196 lb (88.9 kg)  05/19/18 196 lb 6.4 oz (89.1 kg)    Physical Exam Vitals signs reviewed.  Constitutional:      Comments: Breast exam shows left breast with no masses edema or erythema.  There is no left axillary adenopathy.  Right chest wall shows well-healed mastectomy.  She has no chest wall masses.  There is no erythema on the right anterior chest wall.  There is no right axillary adenopathy.  HENT:     Head: Normocephalic and atraumatic.  Eyes:     Pupils: Pupils are equal, round, and reactive to light.  Neck:     Musculoskeletal: Normal range of motion.  Cardiovascular:     Rate and Rhythm: Normal rate and regular rhythm.     Heart sounds: Normal heart sounds.  Pulmonary:     Effort: Pulmonary effort is normal.     Breath sounds: Normal breath sounds.  Abdominal:     General: Bowel sounds are normal.     Palpations: Abdomen is soft.  Musculoskeletal: Normal range of motion.        General: No tenderness or deformity.     Comments: There is edema of the right arm.  There is no erythema.  Lymphadenopathy:     Cervical: No cervical adenopathy.  Skin:    General: Skin is warm and dry.     Findings: No erythema or rash.  Neurological:     Mental Status: She is alert and oriented to person, place, and time.  Psychiatric:        Behavior: Behavior normal.        Thought Content: Thought content normal.        Judgment: Judgment normal.      Lab Results  Component Value Date   WBC 6.3 05/11/2019   HGB 12.4 05/11/2019   HCT 38.2 05/11/2019   MCV 92.5 05/11/2019   PLT 171 05/11/2019   No results found for: FERRITIN, IRON,  TIBC, UIBC, IRONPCTSAT Lab Results  Component Value Date   RBC 4.13 05/11/2019   No results found for: KPAFRELGTCHN, LAMBDASER, KAPLAMBRATIO No results found for: IGGSERUM, IGA, IGMSERUM No results found for: TOTALPROTELP, ALBUMINELP, A1GS, A2GS, BETS, BETA2SER, GAMS, MSPIKE, SPEI   Chemistry      Component Value Date/Time  NA 145 05/10/2018 1031   NA 146 (H) 11/09/2017 1135   NA 144 05/24/2013 0959   K 4.2 05/10/2018 1031   K 3.9 11/09/2017 1135   K 3.9 05/24/2013 0959   CL 106 05/10/2018 1031   CL 104 11/09/2017 1135   CO2 29 05/10/2018 1031   CO2 28 11/09/2017 1135   CO2 27 05/24/2013 0959   BUN 16 05/10/2018 1031   BUN 23 (H) 11/09/2017 1135   BUN 19.7 05/24/2013 0959   CREATININE 1.00 05/10/2018 1031   CREATININE 1.1 11/09/2017 1135   CREATININE 1.1 05/24/2013 0959      Component Value Date/Time   CALCIUM 9.4 05/10/2018 1031   CALCIUM 9.4 11/09/2017 1135   CALCIUM 9.8 05/24/2013 0959   ALKPHOS 55 05/10/2018 1031   ALKPHOS 57 11/09/2017 1135   AST 23 05/10/2018 1031   ALT 21 05/10/2018 1031   ALT 23 11/09/2017 1135   BILITOT 1.0 05/10/2018 1031      Impression and Plan: Ms. Loveall is a very pleasant 78 yo caucasian female with history of 2 separate primary breast cancers, both stage 1 and ER positive.   She continues to do well and so far there has been no evidence of recurrence.   She received her Zometa today as planned.   Hopefully, her blood pressure will be a little bit better we see her back.  It does seem to be better than it was last year.  We will see her back again in another year for follow-up.   They will contact our office with any questions or concerns. We can certainly see her sooner if need be.   [name], MD 6/24/20208:18 AM

## 2019-05-12 LAB — VITAMIN D 25 HYDROXY (VIT D DEFICIENCY, FRACTURES): Vit D, 25-Hydroxy: 34.3 ng/mL (ref 30.0–100.0)

## 2019-05-17 ENCOUNTER — Other Ambulatory Visit: Payer: Self-pay

## 2019-05-17 ENCOUNTER — Ambulatory Visit (AMBULATORY_SURGERY_CENTER): Payer: Self-pay

## 2019-05-17 VITALS — Ht 69.0 in | Wt 194.0 lb

## 2019-05-17 DIAGNOSIS — Z86010 Personal history of colonic polyps: Secondary | ICD-10-CM

## 2019-05-17 MED ORDER — NA SULFATE-K SULFATE-MG SULF 17.5-3.13-1.6 GM/177ML PO SOLN: 1.0000 | Freq: Once | ORAL | 0 refills | Status: AC

## 2019-05-17 NOTE — Progress Notes (Signed)
Denies allergies to eggs or soy products. Denies complication of anesthesia or sedation. Denies use of weight loss medication. Denies use of O2.   Emmi instructions given for colonoscopy.  Pre-Visit was conducted by phone due to Covid 19. Instructions were reviewed and mailed to patients confirmed home address. Patient was encouraged to call if she had questions regarding her instructions.

## 2019-05-24 ENCOUNTER — Telehealth: Payer: Self-pay | Admitting: Gastroenterology

## 2019-05-24 NOTE — Telephone Encounter (Signed)

## 2019-05-25 ENCOUNTER — Other Ambulatory Visit: Payer: Self-pay

## 2019-05-25 ENCOUNTER — Ambulatory Visit (AMBULATORY_SURGERY_CENTER): Payer: Medicare Other | Admitting: Gastroenterology

## 2019-05-25 ENCOUNTER — Encounter: Payer: Self-pay | Admitting: Gastroenterology

## 2019-05-25 VITALS — BP 178/74 | HR 63 | Temp 98.2°F | Resp 12 | Ht 69.0 in | Wt 194.0 lb

## 2019-05-25 DIAGNOSIS — D123 Benign neoplasm of transverse colon: Secondary | ICD-10-CM | POA: Diagnosis not present

## 2019-05-25 DIAGNOSIS — D120 Benign neoplasm of cecum: Secondary | ICD-10-CM | POA: Diagnosis not present

## 2019-05-25 DIAGNOSIS — Z86010 Personal history of colonic polyps: Secondary | ICD-10-CM

## 2019-05-25 MED ORDER — SODIUM CHLORIDE 0.9 % IV SOLN: 500.0000 mL | Freq: Once | INTRAVENOUS | Status: DC

## 2019-05-25 NOTE — Patient Instructions (Signed)
YOU HAD AN ENDOSCOPIC PROCEDURE TODAY AT [facility]:   Refer to the procedure report that was given to you for any specific questions about what was found during the examination.  If the procedure report does not answer your questions, please call your gastroenterologist to clarify.  If you requested that your care partner not be given the details of your procedure findings, then the procedure report has been included in a sealed envelope for you to review at your convenience later.  YOU SHOULD EXPECT: Some feelings of bloating in the abdomen. Passage of more gas than usual.  Walking can help get rid of the air that was put into your GI tract during the procedure and reduce the bloating. If you had a lower endoscopy (such as a colonoscopy or flexible sigmoidoscopy) you may notice spotting of blood in your stool or on the toilet paper. If you underwent a bowel prep for your procedure, you may not have a normal bowel movement for a few days.  Please Note:  You might notice some irritation and congestion in your nose or some drainage.  This is from the oxygen used during your procedure.  There is no need for concern and it should clear up in a day or so.  SYMPTOMS TO REPORT IMMEDIATELY:   Following lower endoscopy (colonoscopy or flexible sigmoidoscopy):  Excessive amounts of blood in the stool  Significant tenderness or worsening of abdominal pains  Swelling of the abdomen that is new, acute  Fever of 100F or higher  For urgent or emergent issues, a gastroenterologist can be reached at any hour by calling [phone].  DIET:  We do recommend a small meal at first, but then you may proceed to your regular diet.  Drink plenty of fluids but you should avoid alcoholic beverages for 24 hours.  ACTIVITY:  You should plan to take it easy for the rest of today and you should NOT DRIVE or use heavy machinery until tomorrow (because of the sedation medicines used during the test).     FOLLOW UP: Our staff will call the number listed on your records 48-72 hours following your procedure to check on you and address any questions or concerns that you may have regarding the information given to you following your procedure. If we do not reach you, we will leave a message.  We will attempt to reach you two times.  During this call, we will ask if you have developed any symptoms of COVID 19. If you develop any symptoms (ie: fever, flu-like symptoms, shortness of breath, cough etc.) before then, please call [phone].  If you test positive for Covid 19 in the 2 weeks post procedure, please call and report this information to [place].    If any biopsies were taken you will be contacted by phone or by letter within the next 1-3 weeks.  Please call us at [phone] if you have not heard about the biopsies in 3 weeks.    SIGNATURES/CONFIDENTIALITY: You and/or your care partner have signed paperwork which will be entered into your electronic medical record.  These signatures attest to the fact that that the information above on your After Visit Summary has been reviewed and is understood.  Full responsibility of the confidentiality of this discharge information lies with you and/or your care-partner.  Await pathology  Please read over handouts about polyps, diverticulosis and hemorrhoids  No repeat colonoscopy  Please continue your normal medications

## 2019-05-25 NOTE — Progress Notes (Signed)
Called to room to assist during endoscopic procedure.  Patient ID and intended procedure confirmed with present staff. Received instructions for my participation in the procedure from the performing physician.  

## 2019-05-25 NOTE — Op Note (Signed)
[facility] Patient Name: Jennifer Yang Procedure Date: 05/25/2019 11:10 AM MRN: [id number] Endoscopist: Mauri Pole , MD Age: 78 Referring MD:  Date of Birth: 09/22/1941 Gender: Female Account #: [id number] Procedure:                Colonoscopy Indications:              High risk colon cancer surveillance: Personal                            history of colonic polyps Medicines:                Monitored Anesthesia Care Procedure:                Pre-Anesthesia Assessment:                           - Prior to the procedure, a History and Physical                            was performed, and patient medications and                            allergies were reviewed. The patient's tolerance of                            previous anesthesia was also reviewed. The risks                            and benefits of the procedure and the sedation                            options and risks were discussed with the patient.                            All questions were answered, and informed consent                            was obtained. Prior Anticoagulants: The patient has                            taken no previous anticoagulant or antiplatelet                            agents. ASA Grade Assessment: II - A patient with                            mild systemic disease. After reviewing the risks                            and benefits, the patient was deemed in                            satisfactory condition to undergo the procedure.  After obtaining informed consent, the colonoscope                            was passed under direct vision. Throughout the                            procedure, the patient's blood pressure, pulse, and                            oxygen saturations were monitored continuously. The                            Colonoscope was introduced through the anus and                            advanced to the the cecum, identified  by                            appendiceal orifice and ileocecal valve. The                            colonoscopy was performed without difficulty. The                            patient tolerated the procedure well. The quality                            of the bowel preparation was excellent. The                            ileocecal valve, appendiceal orifice, and rectum                            were photographed. Scope In: 11:20:03 AM Scope Out: 11:32:13 AM Scope Withdrawal Time: 0 hours 8 minutes 13 seconds  Total Procedure Duration: 0 hours 12 minutes 10 seconds  Findings:                 The perianal and digital rectal examinations were                            normal.                           Two sessile polyps were found in the transverse                            colon and cecum. The polyps were 1 to 2 mm in size.                            These polyps were removed with a cold biopsy                            forceps. Resection and retrieval were complete.  Scattered small-mouthed diverticula were found in                            the sigmoid colon, descending colon, transverse                            colon and ascending colon.                           Non-bleeding internal hemorrhoids were found during                            retroflexion. The hemorrhoids were small.                           The exam was otherwise without abnormality. Complications:            No immediate complications. Estimated Blood Loss:     Estimated blood loss was minimal. Impression:               - Two 1 to 2 mm polyps in the transverse colon and                            in the cecum, removed with a cold biopsy forceps.                            Resected and retrieved.                           - Mild diverticulosis in the sigmoid colon, in the                            descending colon, in the transverse colon and in                            the  ascending colon.                           - Non-bleeding internal hemorrhoids.                           - The examination was otherwise normal. Recommendation:           - Patient has a contact number available for                            emergencies. The signs and symptoms of potential                            delayed complications were discussed with the                            patient. Return to normal activities tomorrow.                            Written discharge instructions were provided  to the                            patient.                           - Resume previous diet.                           - Continue present medications.                           - Await pathology results.                           - No repeat colonoscopy due to age. Mauri Pole, MD 05/25/2019 11:39:36 AM This report has been signed electronically.

## 2019-05-25 NOTE — Progress Notes (Signed)
Pt's states no medical or surgical changes since previsit or office visit.  Temp CW Vitals JB 

## 2019-05-25 NOTE — Progress Notes (Signed)
A/ox3, pleased with MAC, report to RN 

## 2019-05-25 NOTE — Progress Notes (Signed)
Assisted pt to dress while in the RR.  She denies feeling dizzy or lightheaded before discharge

## 2019-05-27 ENCOUNTER — Telehealth: Payer: Self-pay

## 2019-05-27 DIAGNOSIS — M170 Bilateral primary osteoarthritis of knee: Secondary | ICD-10-CM | POA: Diagnosis not present

## 2019-05-27 DIAGNOSIS — M1712 Unilateral primary osteoarthritis, left knee: Secondary | ICD-10-CM | POA: Diagnosis not present

## 2019-05-27 NOTE — Telephone Encounter (Signed)
  Follow up Call-  Call back number 05/25/2019 06/09/2018  Post procedure Call Back phone  # [phone]  Permission to leave phone message Yes Yes  Some recent data might be hidden     Patient questions:  Do you have a fever, pain , or abdominal swelling? No. Pain Score  0 *  Have you tolerated food without any problems? Yes.    Have you been able to return to your normal activities? Yes.    Do you have any questions about your discharge instructions: Diet   No. Medications  No. Follow up visit  No.  Do you have questions or concerns about your Care? No.  Actions: * If pain score is 4 or above: No action needed, pain <4.  1. Have you developed a fever since your procedure? no  2.   Have you had an respiratory symptoms (SOB or cough) since your procedure? no  3.   Have you tested positive for COVID 19 since your procedure no  4.   Have you had any family members/close contacts diagnosed with the COVID 19 since your procedure?  no   If yes to any of these questions please route to Joylene John, RN and Alphonsa Gin, Therapist, sports.

## 2019-06-07 ENCOUNTER — Encounter: Payer: Self-pay | Admitting: Gastroenterology

## 2019-06-20 ENCOUNTER — Inpatient Hospital Stay (HOSPITAL_COMMUNITY): Payer: Medicare Other

## 2019-06-20 ENCOUNTER — Other Ambulatory Visit: Payer: Self-pay

## 2019-06-20 ENCOUNTER — Emergency Department (HOSPITAL_COMMUNITY): Payer: Medicare Other

## 2019-06-20 ENCOUNTER — Inpatient Hospital Stay (HOSPITAL_COMMUNITY)
Admission: EM | Admit: 2019-06-20 | Discharge: 2019-06-24 | DRG: 470 | Disposition: A | Discharge status: Home Health | Payer: Medicare Other | Attending: Family Medicine | Admitting: Family Medicine

## 2019-06-20 ENCOUNTER — Encounter (HOSPITAL_COMMUNITY): Payer: Self-pay

## 2019-06-20 ENCOUNTER — Telehealth: Payer: Self-pay | Admitting: Gastroenterology

## 2019-06-20 DIAGNOSIS — D696 Thrombocytopenia, unspecified: Secondary | ICD-10-CM | POA: Diagnosis not present

## 2019-06-20 DIAGNOSIS — I10 Essential (primary) hypertension: Secondary | ICD-10-CM | POA: Diagnosis not present

## 2019-06-20 DIAGNOSIS — S72001A Fracture of unspecified part of neck of right femur, initial encounter for closed fracture: Secondary | ICD-10-CM | POA: Diagnosis not present

## 2019-06-20 DIAGNOSIS — Z7982 Long term (current) use of aspirin: Secondary | ICD-10-CM | POA: Diagnosis not present

## 2019-06-20 DIAGNOSIS — Y92015 Private garage of single-family (private) house as the place of occurrence of the external cause: Secondary | ICD-10-CM | POA: Diagnosis not present

## 2019-06-20 DIAGNOSIS — S7291XA Unspecified fracture of right femur, initial encounter for closed fracture: Secondary | ICD-10-CM

## 2019-06-20 DIAGNOSIS — M25572 Pain in left ankle and joints of left foot: Secondary | ICD-10-CM | POA: Diagnosis not present

## 2019-06-20 DIAGNOSIS — Y92009 Unspecified place in unspecified non-institutional (private) residence as the place of occurrence of the external cause: Secondary | ICD-10-CM

## 2019-06-20 DIAGNOSIS — Z9221 Personal history of antineoplastic chemotherapy: Secondary | ICD-10-CM | POA: Diagnosis not present

## 2019-06-20 DIAGNOSIS — W108XXA Fall (on) (from) other stairs and steps, initial encounter: Secondary | ICD-10-CM | POA: Diagnosis present

## 2019-06-20 DIAGNOSIS — K5900 Constipation, unspecified: Secondary | ICD-10-CM | POA: Diagnosis present

## 2019-06-20 DIAGNOSIS — S0011XA Contusion of right eyelid and periocular area, initial encounter: Secondary | ICD-10-CM | POA: Diagnosis not present

## 2019-06-20 DIAGNOSIS — B9620 Unspecified Escherichia coli [E. coli] as the cause of diseases classified elsewhere: Secondary | ICD-10-CM | POA: Diagnosis present

## 2019-06-20 DIAGNOSIS — S0033XA Contusion of nose, initial encounter: Secondary | ICD-10-CM | POA: Diagnosis present

## 2019-06-20 DIAGNOSIS — I890 Lymphedema, not elsewhere classified: Secondary | ICD-10-CM | POA: Diagnosis present

## 2019-06-20 DIAGNOSIS — Z20828 Contact with and (suspected) exposure to other viral communicable diseases: Secondary | ICD-10-CM | POA: Diagnosis not present

## 2019-06-20 DIAGNOSIS — Z853 Personal history of malignant neoplasm of breast: Secondary | ICD-10-CM | POA: Diagnosis not present

## 2019-06-20 DIAGNOSIS — Z471 Aftercare following joint replacement surgery: Secondary | ICD-10-CM | POA: Diagnosis not present

## 2019-06-20 DIAGNOSIS — Z8249 Family history of ischemic heart disease and other diseases of the circulatory system: Secondary | ICD-10-CM | POA: Diagnosis not present

## 2019-06-20 DIAGNOSIS — C50911 Malignant neoplasm of unspecified site of right female breast: Secondary | ICD-10-CM | POA: Diagnosis present

## 2019-06-20 DIAGNOSIS — Z923 Personal history of irradiation: Secondary | ICD-10-CM

## 2019-06-20 DIAGNOSIS — J90 Pleural effusion, not elsewhere classified: Secondary | ICD-10-CM | POA: Diagnosis not present

## 2019-06-20 DIAGNOSIS — R8271 Bacteriuria: Secondary | ICD-10-CM | POA: Diagnosis present

## 2019-06-20 DIAGNOSIS — Z03818 Encounter for observation for suspected exposure to other biological agents ruled out: Secondary | ICD-10-CM | POA: Diagnosis not present

## 2019-06-20 DIAGNOSIS — M25551 Pain in right hip: Secondary | ICD-10-CM | POA: Diagnosis not present

## 2019-06-20 DIAGNOSIS — S72041A Displaced fracture of base of neck of right femur, initial encounter for closed fracture: Secondary | ICD-10-CM | POA: Diagnosis not present

## 2019-06-20 DIAGNOSIS — Z96641 Presence of right artificial hip joint: Secondary | ICD-10-CM

## 2019-06-20 DIAGNOSIS — T50905A Adverse effect of unspecified drugs, medicaments and biological substances, initial encounter: Secondary | ICD-10-CM | POA: Diagnosis not present

## 2019-06-20 DIAGNOSIS — M818 Other osteoporosis without current pathological fracture: Secondary | ICD-10-CM | POA: Diagnosis present

## 2019-06-20 DIAGNOSIS — K219 Gastro-esophageal reflux disease without esophagitis: Secondary | ICD-10-CM | POA: Diagnosis present

## 2019-06-20 DIAGNOSIS — E785 Hyperlipidemia, unspecified: Secondary | ICD-10-CM | POA: Diagnosis not present

## 2019-06-20 DIAGNOSIS — F419 Anxiety disorder, unspecified: Secondary | ICD-10-CM | POA: Diagnosis present

## 2019-06-20 DIAGNOSIS — Z419 Encounter for procedure for purposes other than remedying health state, unspecified: Secondary | ICD-10-CM

## 2019-06-20 DIAGNOSIS — D62 Acute posthemorrhagic anemia: Secondary | ICD-10-CM | POA: Diagnosis not present

## 2019-06-20 DIAGNOSIS — M810 Age-related osteoporosis without current pathological fracture: Secondary | ICD-10-CM | POA: Diagnosis not present

## 2019-06-20 DIAGNOSIS — M25461 Effusion, right knee: Secondary | ICD-10-CM | POA: Diagnosis not present

## 2019-06-20 DIAGNOSIS — R52 Pain, unspecified: Secondary | ICD-10-CM | POA: Diagnosis not present

## 2019-06-20 DIAGNOSIS — R0902 Hypoxemia: Secondary | ICD-10-CM | POA: Diagnosis not present

## 2019-06-20 DIAGNOSIS — S8001XA Contusion of right knee, initial encounter: Secondary | ICD-10-CM | POA: Diagnosis present

## 2019-06-20 DIAGNOSIS — E039 Hypothyroidism, unspecified: Secondary | ICD-10-CM | POA: Diagnosis not present

## 2019-06-20 DIAGNOSIS — W19XXXA Unspecified fall, initial encounter: Secondary | ICD-10-CM

## 2019-06-20 DIAGNOSIS — C50912 Malignant neoplasm of unspecified site of left female breast: Secondary | ICD-10-CM | POA: Diagnosis not present

## 2019-06-20 LAB — CBC WITH DIFFERENTIAL/PLATELET
Abs Immature Granulocytes: 0.05 10*3/uL (ref 0.00–0.07)
Basophils Absolute: 0 10*3/uL (ref 0.0–0.1)
Basophils Relative: 1 %
Eosinophils Absolute: 0 10*3/uL (ref 0.0–0.5)
Eosinophils Relative: 0 %
HCT: 39.9 % (ref 36.0–46.0)
Hemoglobin: 12.6 g/dL (ref 12.0–15.0)
Immature Granulocytes: 1 %
Lymphocytes Relative: 16 %
Lymphs Abs: 1.2 10*3/uL (ref 0.7–4.0)
MCH: 29.9 pg (ref 26.0–34.0)
MCHC: 31.6 g/dL (ref 30.0–36.0)
MCV: 94.8 fL (ref 80.0–100.0)
Monocytes Absolute: 0.5 10*3/uL (ref 0.1–1.0)
Monocytes Relative: 6 %
Neutro Abs: 5.8 10*3/uL (ref 1.7–7.7)
Neutrophils Relative %: 76 %
Platelets: 139 10*3/uL — ABNORMAL LOW (ref 150–400)
RBC: 4.21 MIL/uL (ref 3.87–5.11)
RDW: 13 % (ref 11.5–15.5)
WBC: 7.6 10*3/uL (ref 4.0–10.5)
nRBC: 0 % (ref 0.0–0.2)

## 2019-06-20 LAB — COMPREHENSIVE METABOLIC PANEL
ALT: 18 U/L (ref 0–44)
AST: 26 U/L (ref 15–41)
Albumin: 4.1 g/dL (ref 3.5–5.0)
Alkaline Phosphatase: 49 U/L (ref 38–126)
Anion gap: 10 (ref 5–15)
BUN: 19 mg/dL (ref 8–23)
CO2: 24 mmol/L (ref 22–32)
Calcium: 8.9 mg/dL (ref 8.9–10.3)
Chloride: 108 mmol/L (ref 98–111)
Creatinine, Ser: 1.08 mg/dL — ABNORMAL HIGH (ref 0.44–1.00)
GFR calc Af Amer: 57 mL/min — ABNORMAL LOW (ref >60)
GFR calc non Af Amer: 49 mL/min — ABNORMAL LOW (ref >60)
Glucose, Bld: 136 mg/dL — ABNORMAL HIGH (ref 70–99)
Potassium: 4.1 mmol/L (ref 3.5–5.1)
Sodium: 142 mmol/L (ref 135–145)
Total Bilirubin: 1.1 mg/dL (ref 0.3–1.2)
Total Protein: 7.1 g/dL (ref 6.5–8.1)

## 2019-06-20 LAB — PROTIME-INR
INR: 0.9 (ref 0.8–1.2)
Prothrombin Time: 12 seconds (ref 11.4–15.2)

## 2019-06-20 LAB — SARS CORONAVIRUS 2 BY RT PCR (HOSPITAL ORDER, PERFORMED IN ~~LOC~~ HOSPITAL LAB): SARS Coronavirus 2: NEGATIVE

## 2019-06-20 MED ORDER — MORPHINE SULFATE (PF) 4 MG/ML IV SOLN
4.0000 mg | Freq: Once | INTRAVENOUS | Status: AC
  Administered 2019-06-20: 4 mg via INTRAVENOUS
  Filled 2019-06-20: qty 1

## 2019-06-20 NOTE — ED Notes (Signed)
Admitting MD at bedside.

## 2019-06-20 NOTE — ED Notes (Signed)
Pt transported to XR.  

## 2019-06-20 NOTE — ED Triage Notes (Addendum)
Per ems: Pt coming from home c/o right hip pain after falling down the stairs outside while obtaining mail. Stated she "felt something pop." Noted shortening and outer rotation to right hip pain. Also hit bridge of nose. No LOC, no blood thinners. A&Ox4   Given by ems:  4 mg zofran 150 mg fentanyl   22 L AC   Right arm restricted

## 2019-06-20 NOTE — Telephone Encounter (Signed)
Patient would like for someone to call her and explain her colon results letter

## 2019-06-20 NOTE — ED Notes (Signed)
Pt transported to radiology.

## 2019-06-20 NOTE — ED Provider Notes (Signed)
[facility] DEPT Provider Note   CSN: [id number] Arrival date & time: 06/20/19  2025     History   Chief Complaint Chief Complaint  Patient presents with  . Fall  . Hip Pain    HPI Jennifer Yang is a 77 y.o. female hx of breast cancer, here with fall.  Patient states that she slipped and fell landing on the right hip.  Denies any head injury or loss of consciousness patient states that she was unable to walk afterwards.  EMS given her 150 mcg of fentanyl as well as 4 mg of Zofran.  Patient states that she is not on blood thinners.      The history is provided by the patient.    Past Medical History:  Diagnosis Date  . Allergy    shell fish  . Anxiety    takes xanax on a rare occasion  . Arthritis    knees  . Benign essential HTN 11/24/2011  . Bladder infection 03/18/15   completed antibotics   . Breast cancer, right breast ([place]) 02/16/2008  . Breast cancer, stage 1 ([place]) 07/16/1990  . Cancer [facility])    right Chemo/radiation '01/radical '09  . Cataract   . Contact lens/glasses fitting   . 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 Family history of breast cancer    mother  . GERD (gastroesophageal reflux disease)   . Hyperlipidemia   . Hypertension   . Lymphedema of arm 11/24/2011   right arm  . MVP (mitral valve prolapse)   . PONV (postoperative nausea and vomiting)    has vertigo also-would like scop patch  . Syncope 2019   after colonoscopy states "dehydrated"  . Thyroid disease   . Vertigo     Patient Active Problem List   Diagnosis Date Noted  . Right femoral fracture ([place]) 06/20/2019  . Solitary pulmonary nodule 03/22/2015  . Gaseous abdominal distention 03/22/2015  . Near syncope 03/21/2015  . Orthostatic hypotension 03/21/2015  . Dyslipidemia 03/21/2015  . Lipoma of neck 07/28/2012  . 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 Lymphedema of arm 11/24/2011  . 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 Benign essential HTN 11/24/2011  .  Cellulitis of arm, right 11/24/2011  . Breast cancer, right breast ([place]) 02/16/2008  . Breast cancer, stage 1 ([place]) 07/16/1990    Past Surgical History:  Procedure Laterality Date  . BREAST LUMPECTOMY     2001 right  . CATARACT EXTRACTION, BILATERAL    . COLONOSCOPY  2006   w/Dr.Orr  . COLONOSCOPY  2019   fainted afterward at home  . FOOT AMPUTATION     right hammer toe  . FOOT NEUROMA SURGERY     left  . KNEE ARTHROSCOPY     left  . MASS EXCISION  07/21/2012   Procedure: EXCISION MASS;  Surgeon: Adin Hector, MD;  Location: [facility];  Service: General;  Laterality: N/A;  excision 15cm posterior neck mass posterior  . MASTECTOMY    . MASTECTOMY, RADICAL     2009 right  . THYROIDECTOMY, PARTIAL    . UPPER GASTROINTESTINAL ENDOSCOPY       OB History   No obstetric history on file.      Home Medications    Prior to Admission medications   Medication Sig Start Date End Date Taking? Authorizing Provider  ALPRAZolam Duanne Moron) 0.5 MG tablet Take 0.5 mg by mouth daily as needed for anxiety.    Yes [provider]  amLODipine (NORVASC)  5 MG tablet Take 5 mg by mouth daily. 05/19/18  Yes [provider]  aspirin 81 MG EC tablet Take 81 mg by mouth at bedtime.    Yes [provider]  Calcium Carbonate-Vit D-Min (CALTRATE 600+D PLUS MINERALS) 600-800 MG-UNIT CHEW Chew 1 tablet by mouth daily.    Yes [provider]  fluticasone (FLONASE) 50 MCG/ACT nasal spray Place 1-2 sprays into both nostrils daily as needed for allergies or rhinitis.   Yes [provider]  losartan (COZAAR) 100 MG tablet Take 100 mg by mouth daily.    Yes [provider]  meclizine (ANTIVERT) 25 MG tablet Take 25 mg by mouth 3 (three) times daily as needed for dizziness.    Yes [provider]  rosuvastatin (CRESTOR) 5 MG tablet Take 5 mg by mouth daily. 09/01/16  Yes [provider]  Zoledronic Acid (ZOMETA IV) Inject 1 each  into the skin See admin instructions. One a year   Yes [provider]  cetirizine (ZYRTEC) 10 MG tablet Take 1 tablet (10 mg total) by mouth daily. 1 po q day prn allergies Patient not taking: Reported on 06/20/2019 11/15/16   Tanna Furry, MD  ondansetron (ZOFRAN) 4 MG tablet Take 1 tablet (4 mg total) by mouth every 8 (eight) hours as needed for nausea or vomiting. Patient not taking: Reported on 06/20/2019 05/21/18   Mauri Pole, MD    Family History Family History  Problem Relation Age of Onset  . Cancer Mother   . Heart disease Mother   . Heart disease Father   . Heart disease Brother   . Colon cancer Cousin   . Stomach cancer Neg Hx   . Rectal cancer Neg Hx   . Pancreatic cancer Neg Hx   . Esophageal cancer Neg Hx     Social History Social History   Tobacco Use  . Smoking status: Never Smoker  . Smokeless tobacco: Never Used  . Tobacco comment: NEVER USED TOBACCO  Substance Use Topics  . Alcohol use: No    Alcohol/week: 0.0 standard drinks  . Drug use: No     Allergies   Codeine sulfate, Fish-derived products, Rocephin [ceftriaxone sodium], and Sulfa antibiotics   Review of Systems Review of Systems  Musculoskeletal:       L hip pain   All other systems reviewed and are negative.    Physical Exam Updated Vital Signs BP (!) 148/54   Pulse 66   Resp (!) 22   Ht 5\' 9"  (1.753 m)   Wt 87.5 kg   SpO2 94%   BMI 28.50 kg/m   Physical Exam Vitals signs and nursing note reviewed.  Constitutional:      Appearance: Normal appearance.  HENT:     Head: Normocephalic.     Nose:     Comments: Abrasion on nose     Mouth/Throat:     Mouth: Mucous membranes are moist.  Eyes:     Extraocular Movements: Extraocular movements intact.     Pupils: Pupils are equal, round, and reactive to light.  Neck:     Musculoskeletal: Normal range of motion.  Cardiovascular:     Rate and Rhythm: Normal rate and regular rhythm.     Pulses: Normal pulses.      Heart sounds: Normal heart sounds.  Pulmonary:     Effort: Pulmonary effort is normal.     Breath sounds: Normal breath sounds.  Abdominal:     General: Abdomen is flat.  Palpations: Abdomen is soft.  Musculoskeletal:     Comments: Unable to range R hip, no spinal tenderness, nl pedal pulses bilateral legs   Skin:    General: Skin is warm.     Capillary Refill: Capillary refill takes less than 2 seconds.  Neurological:     General: No focal deficit present.     Mental Status: She is alert.      ED Treatments / Results  Labs (all labs ordered are listed, but only abnormal results are displayed) Labs Reviewed  CBC WITH DIFFERENTIAL/PLATELET - Abnormal; Notable for the following components:      Result Value   Platelets 139 (*)    All other components within normal limits  COMPREHENSIVE METABOLIC PANEL - Abnormal; Notable for the following components:   Glucose, Bld 136 (*)    Creatinine, Ser 1.08 (*)    GFR calc non Af Amer 49 (*)    GFR calc Af Amer 57 (*)    All other components within normal limits  PROTIME-INR    EKG EKG Interpretation  Date/Time:  Monday June 20 2019 21:02:04 EDT Ventricular Rate:  87 PR Interval:    QRS Duration: 97 QT Interval:  390 QTC Calculation: 470 R Axis:   1 Text Interpretation:  Sinus rhythm Low voltage, precordial leads no change from previous Confirmed by Charlesetta Shanks [phone]) on 06/20/2019 9:17:26 PM   Radiology Dg Chest 1 View  Result Date: 06/20/2019 CLINICAL DATA:  Fall EXAM: CHEST  1 VIEW COMPARISON:  01/17/2016 FINDINGS: Mild cardiomegaly with shallow lung inflation. No focal airspace consolidation or pulmonary edema. No pleural effusion. Right axillary surgical clips. IMPRESSION: Mild cardiomegaly without acute airspace disease. Electronically Signed   By: Ulyses Jarred M.D.   On: 06/20/2019 21:43   Dg Hip Unilat W Or Wo Pelvis 2-3 Views Right  Result Date: 06/20/2019 CLINICAL DATA:  Fall down stairs EXAM: DG HIP (WITH  OR WITHOUT PELVIS) 2-3V RIGHT COMPARISON:  None. FINDINGS: There is a mildly superiorly displaced fracture of the right femoral neck. The femoral head remains situated in the acetabulum. No other pelvic fracture. IMPRESSION: Mildly displaced fracture of the right femoral neck. Electronically Signed   By: Ulyses Jarred M.D.   On: 06/20/2019 21:41    Procedures Procedures (including critical care time)  Medications Ordered in ED Medications  morphine 4 MG/ML injection 4 mg (4 mg Intravenous Given 06/20/19 2116)     Initial Impression / Assessment and Plan / ED Course  I have reviewed the triage vital signs and the nursing notes.  Pertinent labs & imaging results that were available during my care of the patient were reviewed by me and considered in my medical decision making (see chart for details).       Jennifer Yang is a 78 y.o. female here with fall, R hip pain.  Patient is on blood thinners had no head injury.  I am concerned for possible hip fracture versus pelvic fracture.  Will get preop labs and hip xrays   10:30 pm Xray showed R femoral neck fracture. Dr. Alvan Dame consulted and will see in the morning. Hospitalist to admit. COVID ordered    Final Clinical Impressions(s) / ED Diagnoses   Final diagnoses:  None    ED Discharge Orders    None       Drenda Freeze, MD 06/20/19 2316

## 2019-06-21 ENCOUNTER — Inpatient Hospital Stay (HOSPITAL_COMMUNITY): Payer: Medicare Other | Admitting: Anesthesiology

## 2019-06-21 ENCOUNTER — Encounter (HOSPITAL_COMMUNITY): Payer: Self-pay

## 2019-06-21 ENCOUNTER — Inpatient Hospital Stay (HOSPITAL_COMMUNITY): Payer: Medicare Other

## 2019-06-21 ENCOUNTER — Encounter (HOSPITAL_COMMUNITY)
Admission: EM | Disposition: A | Discharge status: Home Health | Payer: Self-pay | Source: Home / Self Care | Attending: Internal Medicine

## 2019-06-21 DIAGNOSIS — C50912 Malignant neoplasm of unspecified site of left female breast: Secondary | ICD-10-CM | POA: Diagnosis not present

## 2019-06-21 DIAGNOSIS — E039 Hypothyroidism, unspecified: Secondary | ICD-10-CM | POA: Diagnosis not present

## 2019-06-21 DIAGNOSIS — E785 Hyperlipidemia, unspecified: Secondary | ICD-10-CM

## 2019-06-21 DIAGNOSIS — S72001A Fracture of unspecified part of neck of right femur, initial encounter for closed fracture: Principal | ICD-10-CM

## 2019-06-21 DIAGNOSIS — T50905A Adverse effect of unspecified drugs, medicaments and biological substances, initial encounter: Secondary | ICD-10-CM

## 2019-06-21 DIAGNOSIS — D696 Thrombocytopenia, unspecified: Secondary | ICD-10-CM | POA: Diagnosis present

## 2019-06-21 DIAGNOSIS — I10 Essential (primary) hypertension: Secondary | ICD-10-CM

## 2019-06-21 DIAGNOSIS — M818 Other osteoporosis without current pathological fracture: Secondary | ICD-10-CM

## 2019-06-21 HISTORY — PX: TOTAL HIP ARTHROPLASTY: SHX124

## 2019-06-21 LAB — CBC
HCT: 37.9 % (ref 36.0–46.0)
Hemoglobin: 11.9 g/dL — ABNORMAL LOW (ref 12.0–15.0)
MCH: 29.8 pg (ref 26.0–34.0)
MCHC: 31.4 g/dL (ref 30.0–36.0)
MCV: 94.8 fL (ref 80.0–100.0)
Platelets: 139 10*3/uL — ABNORMAL LOW (ref 150–400)
RBC: 4 MIL/uL (ref 3.87–5.11)
RDW: 12.9 % (ref 11.5–15.5)
WBC: 9.7 10*3/uL (ref 4.0–10.5)
nRBC: 0 % (ref 0.0–0.2)

## 2019-06-21 LAB — COMPREHENSIVE METABOLIC PANEL
ALT: 19 U/L (ref 0–44)
AST: 26 U/L (ref 15–41)
Albumin: 3.8 g/dL (ref 3.5–5.0)
Alkaline Phosphatase: 44 U/L (ref 38–126)
Anion gap: 8 (ref 5–15)
BUN: 18 mg/dL (ref 8–23)
CO2: 26 mmol/L (ref 22–32)
Calcium: 8.6 mg/dL — ABNORMAL LOW (ref 8.9–10.3)
Chloride: 106 mmol/L (ref 98–111)
Creatinine, Ser: 1.08 mg/dL — ABNORMAL HIGH (ref 0.44–1.00)
GFR calc Af Amer: 57 mL/min — ABNORMAL LOW (ref >60)
GFR calc non Af Amer: 49 mL/min — ABNORMAL LOW (ref >60)
Glucose, Bld: 192 mg/dL — ABNORMAL HIGH (ref 70–99)
Potassium: 4.7 mmol/L (ref 3.5–5.1)
Sodium: 140 mmol/L (ref 135–145)
Total Bilirubin: 1.1 mg/dL (ref 0.3–1.2)
Total Protein: 6.7 g/dL (ref 6.5–8.1)

## 2019-06-21 LAB — SURGICAL PCR SCREEN
MRSA, PCR: NEGATIVE
Staphylococcus aureus: NEGATIVE

## 2019-06-21 LAB — ABO/RH: ABO/RH(D): B POS

## 2019-06-21 LAB — PREPARE RBC (CROSSMATCH)

## 2019-06-21 SURGERY — ARTHROPLASTY, HIP, TOTAL, ANTERIOR APPROACH
Anesthesia: General | Site: Hip | Laterality: Right

## 2019-06-21 MED ORDER — ROCURONIUM BROMIDE 50 MG/5ML IV SOSY
PREFILLED_SYRINGE | INTRAVENOUS | Status: DC | PRN
  Administered 2019-06-21: 10 mg via INTRAVENOUS
  Administered 2019-06-21: 40 mg via INTRAVENOUS

## 2019-06-21 MED ORDER — DEXAMETHASONE SODIUM PHOSPHATE 10 MG/ML IJ SOLN
INTRAMUSCULAR | Status: AC
  Filled 2019-06-21: qty 1

## 2019-06-21 MED ORDER — ALBUMIN HUMAN 5 % IV SOLN
INTRAVENOUS | Status: DC | PRN
  Administered 2019-06-21: 20:00:00 via INTRAVENOUS

## 2019-06-21 MED ORDER — METOCLOPRAMIDE HCL 5 MG/ML IJ SOLN
5.0000 mg | Freq: Three times a day (TID) | INTRAMUSCULAR | Status: DC | PRN
  Administered 2019-06-21: 10 mg via INTRAVENOUS
  Filled 2019-06-21: qty 2

## 2019-06-21 MED ORDER — FLUTICASONE PROPIONATE 50 MCG/ACT NA SUSP
1.0000 | Freq: Every day | NASAL | Status: DC | PRN
  Filled 2019-06-21: qty 16

## 2019-06-21 MED ORDER — DOCUSATE SODIUM 100 MG PO CAPS
100.0000 mg | ORAL_CAPSULE | Freq: Two times a day (BID) | ORAL | Status: DC
  Administered 2019-06-22 – 2019-06-24 (×5): 100 mg via ORAL
  Filled 2019-06-21 (×5): qty 1

## 2019-06-21 MED ORDER — KETOROLAC TROMETHAMINE 15 MG/ML IJ SOLN: 15.0000 mg | Freq: Four times a day (QID) | INTRAMUSCULAR | Status: DC | PRN

## 2019-06-21 MED ORDER — FENTANYL CITRATE (PF) 100 MCG/2ML IJ SOLN
INTRAMUSCULAR | Status: DC | PRN
  Administered 2019-06-21 (×5): 50 ug via INTRAVENOUS

## 2019-06-21 MED ORDER — GLYCOPYRROLATE 0.2 MG/ML IJ SOLN
INTRAMUSCULAR | Status: DC | PRN
  Administered 2019-06-21: 0.2 mg via INTRAVENOUS

## 2019-06-21 MED ORDER — SUGAMMADEX SODIUM 200 MG/2ML IV SOLN
INTRAVENOUS | Status: DC | PRN
  Administered 2019-06-21: 200 mg via INTRAVENOUS

## 2019-06-21 MED ORDER — ALBUMIN HUMAN 5 % IV SOLN
INTRAVENOUS | Status: AC
  Filled 2019-06-21: qty 250

## 2019-06-21 MED ORDER — ONDANSETRON HCL 4 MG PO TABS
4.0000 mg | ORAL_TABLET | Freq: Four times a day (QID) | ORAL | Status: DC | PRN
  Administered 2019-06-22 – 2019-06-23 (×2): 4 mg via ORAL
  Filled 2019-06-21 (×2): qty 1

## 2019-06-21 MED ORDER — PROPOFOL 10 MG/ML IV BOLUS
INTRAVENOUS | Status: AC
  Filled 2019-06-21: qty 20

## 2019-06-21 MED ORDER — CEFAZOLIN SODIUM-DEXTROSE 2-4 GM/100ML-% IV SOLN
2.0000 g | INTRAVENOUS | Status: AC
  Administered 2019-06-21: 18:00:00 2 g via INTRAVENOUS
  Filled 2019-06-21: qty 100

## 2019-06-21 MED ORDER — LIDOCAINE 2% (20 MG/ML) 5 ML SYRINGE
INTRAMUSCULAR | Status: AC
  Filled 2019-06-21: qty 5

## 2019-06-21 MED ORDER — FERROUS SULFATE 325 (65 FE) MG PO TABS
325.0000 mg | ORAL_TABLET | Freq: Three times a day (TID) | ORAL | Status: DC
  Administered 2019-06-22 – 2019-06-23 (×4): 325 mg via ORAL
  Filled 2019-06-21 (×6): qty 1

## 2019-06-21 MED ORDER — AMLODIPINE BESYLATE 5 MG PO TABS: 5.0000 mg | ORAL_TABLET | Freq: Every day | ORAL | Status: DC

## 2019-06-21 MED ORDER — TRAMADOL HCL 50 MG PO TABS
50.0000 mg | ORAL_TABLET | Freq: Four times a day (QID) | ORAL | Status: DC | PRN
  Administered 2019-06-22: 50 mg via ORAL
  Administered 2019-06-22 – 2019-06-23 (×2): 100 mg via ORAL
  Administered 2019-06-24: 50 mg via ORAL
  Filled 2019-06-21: qty 1
  Filled 2019-06-21: qty 2
  Filled 2019-06-21: qty 1
  Filled 2019-06-21 (×2): qty 2

## 2019-06-21 MED ORDER — ONDANSETRON HCL 4 MG/2ML IJ SOLN
INTRAMUSCULAR | Status: DC | PRN
  Administered 2019-06-21 (×2): 4 mg via INTRAVENOUS

## 2019-06-21 MED ORDER — ACETAMINOPHEN 500 MG PO TABS
1000.0000 mg | ORAL_TABLET | Freq: Three times a day (TID) | ORAL | Status: DC
  Administered 2019-06-22 – 2019-06-23 (×6): 1000 mg via ORAL
  Filled 2019-06-21 (×6): qty 2

## 2019-06-21 MED ORDER — SUCCINYLCHOLINE CHLORIDE 200 MG/10ML IV SOSY
PREFILLED_SYRINGE | INTRAVENOUS | Status: DC | PRN
  Administered 2019-06-21: 80 mg via INTRAVENOUS

## 2019-06-21 MED ORDER — DEXTROSE-NACL 5-0.9 % IV SOLN
INTRAVENOUS | Status: DC
  Administered 2019-06-21 (×3): via INTRAVENOUS

## 2019-06-21 MED ORDER — ALPRAZOLAM 0.5 MG PO TABS: 0.5000 mg | ORAL_TABLET | Freq: Every day | ORAL | Status: DC | PRN

## 2019-06-21 MED ORDER — FENTANYL CITRATE (PF) 250 MCG/5ML IJ SOLN
INTRAMUSCULAR | Status: AC
  Filled 2019-06-21: qty 5

## 2019-06-21 MED ORDER — METHOCARBAMOL 500 MG IVPB - SIMPLE MED
500.0000 mg | Freq: Four times a day (QID) | INTRAVENOUS | Status: DC | PRN
  Filled 2019-06-21: qty 50

## 2019-06-21 MED ORDER — METOCLOPRAMIDE HCL 5 MG PO TABS: 5.0000 mg | ORAL_TABLET | Freq: Three times a day (TID) | ORAL | Status: DC | PRN

## 2019-06-21 MED ORDER — ESMOLOL HCL 100 MG/10ML IV SOLN
INTRAVENOUS | Status: AC
  Filled 2019-06-21: qty 10

## 2019-06-21 MED ORDER — MUPIROCIN 2 % EX OINT
1.0000 "application " | TOPICAL_OINTMENT | Freq: Two times a day (BID) | CUTANEOUS | Status: DC
  Administered 2019-06-21 – 2019-06-24 (×6): 1 via NASAL
  Filled 2019-06-21 (×3): qty 22

## 2019-06-21 MED ORDER — TRANEXAMIC ACID-NACL 1000-0.7 MG/100ML-% IV SOLN
1000.0000 mg | INTRAVENOUS | Status: AC
  Administered 2019-06-21: 18:00:00 1000 mg via INTRAVENOUS
  Filled 2019-06-21: qty 100

## 2019-06-21 MED ORDER — ONDANSETRON HCL 4 MG/2ML IJ SOLN
INTRAMUSCULAR | Status: AC
  Filled 2019-06-21: qty 2

## 2019-06-21 MED ORDER — MECLIZINE HCL 25 MG PO TABS: 25.0000 mg | ORAL_TABLET | Freq: Three times a day (TID) | ORAL | Status: DC | PRN

## 2019-06-21 MED ORDER — SODIUM CHLORIDE 0.9 % IV SOLN: INTRAVENOUS | Status: DC

## 2019-06-21 MED ORDER — CHLORHEXIDINE GLUCONATE 4 % EX LIQD: 60.0000 mL | Freq: Once | CUTANEOUS | Status: DC

## 2019-06-21 MED ORDER — SODIUM CHLORIDE 0.9 % IR SOLN
Status: DC | PRN
  Administered 2019-06-21: 1000 mL

## 2019-06-21 MED ORDER — LOSARTAN POTASSIUM 50 MG PO TABS: 100.0000 mg | ORAL_TABLET | Freq: Every day | ORAL | Status: DC

## 2019-06-21 MED ORDER — CALTRATE 600+D PLUS MINERALS 600-800 MG-UNIT PO CHEW: 1.0000 | CHEWABLE_TABLET | Freq: Every day | ORAL | Status: DC

## 2019-06-21 MED ORDER — METHOCARBAMOL 500 MG PO TABS
500.0000 mg | ORAL_TABLET | Freq: Four times a day (QID) | ORAL | Status: DC | PRN
  Administered 2019-06-22: 500 mg via ORAL
  Filled 2019-06-21 (×2): qty 1

## 2019-06-21 MED ORDER — LIDOCAINE 2% (20 MG/ML) 5 ML SYRINGE
INTRAMUSCULAR | Status: DC | PRN
  Administered 2019-06-21: 60 mg via INTRAVENOUS

## 2019-06-21 MED ORDER — POVIDONE-IODINE 10 % EX SWAB: 2.0000 "application " | Freq: Once | CUTANEOUS | Status: DC

## 2019-06-21 MED ORDER — ESMOLOL HCL 100 MG/10ML IV SOLN
INTRAVENOUS | Status: DC | PRN
  Administered 2019-06-21 (×2): 10 mg via INTRAVENOUS

## 2019-06-21 MED ORDER — STERILE WATER FOR IRRIGATION IR SOLN
Status: DC | PRN
  Administered 2019-06-21: 2000 mL

## 2019-06-21 MED ORDER — MORPHINE SULFATE (PF) 2 MG/ML IV SOLN
2.0000 mg | INTRAVENOUS | Status: DC | PRN
  Administered 2019-06-21 (×8): 2 mg via INTRAVENOUS
  Filled 2019-06-21 (×8): qty 1

## 2019-06-21 MED ORDER — PROPOFOL 10 MG/ML IV BOLUS
INTRAVENOUS | Status: DC | PRN
  Administered 2019-06-21: 100 mg via INTRAVENOUS

## 2019-06-21 MED ORDER — LACTATED RINGERS IV SOLN
INTRAVENOUS | Status: DC | PRN
  Administered 2019-06-21: 18:00:00 via INTRAVENOUS

## 2019-06-21 MED ORDER — FENTANYL CITRATE (PF) 100 MCG/2ML IJ SOLN
INTRAMUSCULAR | Status: AC
  Filled 2019-06-21: qty 2

## 2019-06-21 MED ORDER — LACTATED RINGERS IV SOLN
INTRAVENOUS | Status: DC
  Administered 2019-06-21 (×2): via INTRAVENOUS

## 2019-06-21 MED ORDER — ONDANSETRON HCL 4 MG/2ML IJ SOLN
4.0000 mg | Freq: Four times a day (QID) | INTRAMUSCULAR | Status: DC | PRN
  Administered 2019-06-21: 4 mg via INTRAVENOUS
  Filled 2019-06-21: qty 2

## 2019-06-21 MED ORDER — ROSUVASTATIN CALCIUM 5 MG PO TABS
5.0000 mg | ORAL_TABLET | Freq: Every day | ORAL | Status: DC
  Administered 2019-06-22 – 2019-06-24 (×3): 5 mg via ORAL
  Filled 2019-06-21 (×3): qty 1

## 2019-06-21 MED ORDER — VANCOMYCIN HCL IN DEXTROSE 1-5 GM/200ML-% IV SOLN
1000.0000 mg | Freq: Two times a day (BID) | INTRAVENOUS | Status: AC
  Administered 2019-06-22: 1000 mg via INTRAVENOUS
  Filled 2019-06-21: qty 200

## 2019-06-21 MED ORDER — PHENOL 1.4 % MT LIQD: 1.0000 | OROMUCOSAL | Status: DC | PRN

## 2019-06-21 MED ORDER — ASPIRIN EC 325 MG PO TBEC
325.0000 mg | DELAYED_RELEASE_TABLET | Freq: Two times a day (BID) | ORAL | Status: DC
  Administered 2019-06-22 – 2019-06-24 (×5): 325 mg via ORAL
  Filled 2019-06-21 (×5): qty 1

## 2019-06-21 MED ORDER — CALCIUM CARBONATE-VITAMIN D 500-200 MG-UNIT PO TABS
1.0000 | ORAL_TABLET | Freq: Every day | ORAL | Status: DC
  Administered 2019-06-22 – 2019-06-24 (×3): 1 via ORAL
  Filled 2019-06-21 (×3): qty 1

## 2019-06-21 MED ORDER — MORPHINE SULFATE (PF) 4 MG/ML IV SOLN: 0.5000 mg | INTRAVENOUS | Status: DC | PRN

## 2019-06-21 MED ORDER — ROCURONIUM BROMIDE 10 MG/ML (PF) SYRINGE
PREFILLED_SYRINGE | INTRAVENOUS | Status: AC
  Filled 2019-06-21: qty 10

## 2019-06-21 MED ORDER — MENTHOL 3 MG MT LOZG: 1.0000 | LOZENGE | OROMUCOSAL | Status: DC | PRN

## 2019-06-21 MED ORDER — PHENYLEPHRINE 40 MCG/ML (10ML) SYRINGE FOR IV PUSH (FOR BLOOD PRESSURE SUPPORT)
PREFILLED_SYRINGE | INTRAVENOUS | Status: AC
  Filled 2019-06-21: qty 10

## 2019-06-21 MED ORDER — DEXAMETHASONE SODIUM PHOSPHATE 10 MG/ML IJ SOLN
INTRAMUSCULAR | Status: DC | PRN
  Administered 2019-06-21: 10 mg via INTRAVENOUS

## 2019-06-21 SURGICAL SUPPLY — 49 items
ADH SKN CLS APL DERMABOND .7 (GAUZE/BANDAGES/DRESSINGS) ×1
BAG DECANTER FOR FLEXI CONT (MISCELLANEOUS) IMPLANT
BAG SPEC THK2 15X12 ZIP CLS (MISCELLANEOUS)
BAG ZIPLOCK 12X15 (MISCELLANEOUS) IMPLANT
BLADE SAG 18X100X1.27 (BLADE) ×2 IMPLANT
BLADE SURG SZ10 CARB STEEL (BLADE) ×4 IMPLANT
COVER PERINEAL POST (MISCELLANEOUS) ×2 IMPLANT
COVER SURGICAL LIGHT HANDLE (MISCELLANEOUS) ×2 IMPLANT
COVER WAND RF STERILE (DRAPES) IMPLANT
CUP ACETBLR 52 OD PINNACLE (Hips) ×1 IMPLANT
DERMABOND ADVANCED (GAUZE/BANDAGES/DRESSINGS) ×1
DERMABOND ADVANCED .7 DNX12 (GAUZE/BANDAGES/DRESSINGS) ×1 IMPLANT
DRAPE STERI IOBAN 125X83 (DRAPES) ×2 IMPLANT
DRAPE U-SHAPE 47X51 STRL (DRAPES) ×4 IMPLANT
DRESSING AQUACEL AG SP 3.5X10 (GAUZE/BANDAGES/DRESSINGS) ×1 IMPLANT
DRSG AQUACEL AG SP 3.5X10 (GAUZE/BANDAGES/DRESSINGS) ×2
DURAPREP 26ML APPLICATOR (WOUND CARE) ×2 IMPLANT
ELECT BLADE TIP CTD 4 INCH (ELECTRODE) ×2 IMPLANT
ELECT REM PT RETURN 15FT ADLT (MISCELLANEOUS) ×2 IMPLANT
ELIMINATOR HOLE APEX DEPUY (Hips) ×1 IMPLANT
GLOVE BIO SURGEON STRL SZ 6 (GLOVE) ×4 IMPLANT
GLOVE BIOGEL PI IND STRL 6.5 (GLOVE) ×1 IMPLANT
GLOVE BIOGEL PI IND STRL 7.5 (GLOVE) ×1 IMPLANT
GLOVE BIOGEL PI IND STRL 8.5 (GLOVE) ×1 IMPLANT
GLOVE BIOGEL PI INDICATOR 6.5 (GLOVE) ×1
GLOVE BIOGEL PI INDICATOR 7.5 (GLOVE) ×1
GLOVE BIOGEL PI INDICATOR 8.5 (GLOVE) ×1
GLOVE ECLIPSE 8.0 STRL XLNG CF (GLOVE) ×4 IMPLANT
GLOVE ORTHO TXT STRL SZ7.5 (GLOVE) ×4 IMPLANT
GOWN STRL REUS W/TWL LRG LVL3 (GOWN DISPOSABLE) ×4 IMPLANT
GOWN STRL REUS W/TWL XL LVL3 (GOWN DISPOSABLE) ×2 IMPLANT
HEAD M SROM 36MM PLUS 1.5 (Hips) IMPLANT
HOLDER FOLEY CATH W/STRAP (MISCELLANEOUS) ×2 IMPLANT
KIT TURNOVER KIT A (KITS) IMPLANT
LINER NEUTRAL 52X36MM PLUS 4 (Liner) ×1 IMPLANT
PACK ANTERIOR HIP CUSTOM (KITS) ×2 IMPLANT
SCREW 6.5MMX25MM (Screw) ×1 IMPLANT
SROM M HEAD 36MM PLUS 1.5 (Hips) ×2 IMPLANT
STEM TRI LOC BPS SZ7 W GRIPTON (Hips) IMPLANT
SUT MNCRL AB 4-0 PS2 18 (SUTURE) ×2 IMPLANT
SUT STRATAFIX 0 PDS 27 VIOLET (SUTURE) ×2
SUT VIC AB 1 CT1 36 (SUTURE) ×6 IMPLANT
SUT VIC AB 2-0 CT1 27 (SUTURE) ×4
SUT VIC AB 2-0 CT1 TAPERPNT 27 (SUTURE) ×2 IMPLANT
SUTURE STRATFX 0 PDS 27 VIOLET (SUTURE) ×1 IMPLANT
TRAY FOLEY MTR SLVR 14FR STAT (SET/KITS/TRAYS/PACK) ×1 IMPLANT
TRI LOC BPS SZ 7 W GRIPTON (Hips) ×2 IMPLANT
WATER STERILE IRR 1000ML POUR (IV SOLUTION) ×2 IMPLANT
YANKAUER SUCT BULB TIP 10FT TU (MISCELLANEOUS) IMPLANT

## 2019-06-21 NOTE — Anesthesia Preprocedure Evaluation (Addendum)
Anesthesia Evaluation  Patient identified by MRN, date of birth, ID band Patient awake    Reviewed: Allergy & Precautions, H&P , NPO status , Patient's Chart, lab work & pertinent test results  History of Anesthesia Complications (+) PONV  Airway Mallampati: II  TM Distance: >3 FB Neck ROM: Full    Dental no notable dental hx. (+) Teeth Intact, Dental Advisory Given   Pulmonary neg pulmonary ROS,    Pulmonary exam normal breath sounds clear to auscultation       Cardiovascular hypertension, Pt. on medications  Rhythm:Regular Rate:Normal     Neuro/Psych Anxiety negative neurological ROS     GI/Hepatic negative GI ROS, Neg liver ROS,   Endo/Other  Hypothyroidism   Renal/GU negative Renal ROS  negative genitourinary   Musculoskeletal  (+) Arthritis , Osteoarthritis,    Abdominal   Peds  Hematology negative hematology ROS (+)   Anesthesia Other Findings   Reproductive/Obstetrics negative OB ROS                            Anesthesia Physical Anesthesia Plan  ASA: II  Anesthesia Plan: General   Post-op Pain Management:    Induction: Intravenous  PONV Risk Score and Plan: 4 or greater and Ondansetron, Dexamethasone and Treatment may vary due to age or medical condition  Airway Management Planned: Oral ETT  Additional Equipment:   Intra-op Plan:   Post-operative Plan: Extubation in OR  Informed Consent: I have reviewed the patients History and Physical, chart, labs and discussed the procedure including the risks, benefits and alternatives for the proposed anesthesia with the patient or authorized representative who has indicated his/her understanding and acceptance.     Dental advisory given  Plan Discussed with: CRNA  Anesthesia Plan Comments:         Anesthesia Quick Evaluation

## 2019-06-21 NOTE — Transfer of Care (Signed)
Immediate Anesthesia Transfer of Care Note  Patient: Jennifer Yang  Procedure(s) Performed: TOTAL HIP ARTHROPLASTY ANTERIOR APPROACH (Right Hip)  Patient Location: PACU  Anesthesia Type:General  Level of Consciousness: awake, alert  and patient cooperative  Airway & Oxygen Therapy: Patient Spontanous Breathing and Patient connected to face mask oxygen  Post-op Assessment: Report given to RN and Post -op Vital signs reviewed and stable  Post vital signs: Reviewed and stable  Last Vitals:  Vitals Value Taken Time  BP 139/82 06/21/19 2030  Temp    Pulse 98 06/21/19 2031  Resp 12 06/21/19 2031  SpO2 99 % 06/21/19 2031  Vitals shown include unvalidated device data.  Last Pain:  Vitals:   06/21/19 1630  TempSrc: Oral  PainSc:          Complications: No apparent anesthesia complications

## 2019-06-21 NOTE — ED Notes (Addendum)
Rpt called to [employer]

## 2019-06-21 NOTE — Interval H&P Note (Signed)
History and Physical Interval Note:  06/21/2019 4:34 PM  KEALA DRUM  has presented today for surgery, with the diagnosis of right femoral neck fracture.  The various methods of treatment have been discussed with the patient and family. After consideration of risks, benefits and other options for treatment, the patient has consented to  Procedure(s): TOTAL HIP ARTHROPLASTY ANTERIOR APPROACH (Right) as a surgical intervention.  The patient's history has been reviewed, patient examined, no change in status, stable for surgery.  I have reviewed the patient's chart and labs.  Questions were answered to the patient's satisfaction.     Mauri Pole

## 2019-06-21 NOTE — Telephone Encounter (Signed)
No answer. Left a message to return my call.
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
## 2019-06-21 NOTE — ED Notes (Signed)
ED TO INPATIENT HANDOFF REPORT  Name/Age/Gender Jennifer Yang 78 y.o. female  Code Status    Code Status Orders  (From admission, onward)         Start     Ordered   06/21/19 0017  Full code  Continuous     06/21/19 0016        Code Status History    Date Active Date Inactive Code Status Order ID Comments User Context   03/21/2015 1752 03/22/2015 1522 Full Code [id number]  Louellen Molder, MD Inpatient   Advance Care Planning Activity      Home/SNF/Other Home  Chief Complaint Right Hip Pain due to Fall  Level of Care/Admitting Diagnosis ED Disposition    ED Disposition Condition [place]: [facility] [100102]  Level of Care: Med-Surg [16]  Covid Evaluation: Asymptomatic Screening Protocol (No Symptoms)  Diagnosis: Right femoral fracture ([place]) [355732]  Admitting Physician: Elwyn Reach [[id number]]  Attending Physician: Elwyn Reach [[id number]]  Estimated length of stay: past midnight tomorrow  Certification:: I certify this patient will need inpatient services for at least 2 midnights  PT Class (Do Not Modify): Inpatient [101]  PT Acc Code (Do Not Modify): Private [1]       Medical History Past Medical History:  Diagnosis Date  . Allergy    shell fish  . Anxiety    takes xanax on a rare occasion  . Arthritis    knees  . Benign essential HTN 11/24/2011  . Bladder infection 03/18/15   completed antibotics   . Breast cancer, right breast ([place]) 02/16/2008  . Breast cancer, stage 1 ([place]) 07/16/1990  . Cancer [facility])    right Chemo/radiation '01/radical '09  . Cataract   . Contact lens/glasses fitting   . 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 Family history of breast cancer    mother  . GERD (gastroesophageal reflux disease)   . Hyperlipidemia   . Hypertension   . Lymphedema of arm 11/24/2011   right arm  . MVP (mitral valve prolapse)   . PONV (postoperative nausea and vomiting)    has vertigo also-would like scop  patch  . Syncope 2019   after colonoscopy states "dehydrated"  . Thyroid disease   . Vertigo     Allergies Allergies  Allergen Reactions  . Codeine Sulfate Swelling  . Fish-Derived Products Other (See Comments)    Cellulitis   . Rocephin [Ceftriaxone Sodium] Other (See Comments)    Low blood count  . Sulfa Antibiotics Other (See Comments)    Mouth and lip sores    IV Location/Drains/Wounds Patient Lines/Drains/Airways Status   Active Line/Drains/Airways    Name:   Placement date:   Placement time:   Site:   Days:   Peripheral IV 06/20/19 Anterior;Left;Proximal Forearm   06/20/19    2037    Forearm   1          Labs/Imaging Results for orders placed or performed during the hospital encounter of 06/20/19 (from the past 48 hour(s))  CBC with Differential/Platelet     Status: Abnormal   Collection Time: 06/20/19  9:13 PM  Result Value Ref Range   WBC 7.6 4.0 - 10.5 K/uL   RBC 4.21 3.87 - 5.11 MIL/uL   Hemoglobin 12.6 12.0 - 15.0 g/dL   HCT 39.9 36.0 - 46.0 %   MCV 94.8 80.0 - 100.0 fL   MCH 29.9 26.0 - 34.0 pg   MCHC 31.6 30.0 -  36.0 g/dL   RDW 13.0 11.5 - 15.5 %   Platelets 139 (L) 150 - 400 K/uL    Comment: REPEATED TO VERIFY   nRBC 0.0 0.0 - 0.2 %   Neutrophils Relative % 76 %   Neutro Abs 5.8 1.7 - 7.7 K/uL   Lymphocytes Relative 16 %   Lymphs Abs 1.2 0.7 - 4.0 K/uL   Monocytes Relative 6 %   Monocytes Absolute 0.5 0.1 - 1.0 K/uL   Eosinophils Relative 0 %   Eosinophils Absolute 0.0 0.0 - 0.5 K/uL   Basophils Relative 1 %   Basophils Absolute 0.0 0.0 - 0.1 K/uL   Immature Granulocytes 1 %   Abs Immature Granulocytes 0.05 0.00 - 0.07 K/uL    Comment: Performed at [facility], [place] [address], [place], [place] [address]  Comprehensive metabolic panel     Status: Abnormal   Collection Time: 06/20/19  9:13 PM  Result Value Ref Range   Sodium 142 135 - 145 mmol/L   Potassium 4.1 3.5 - 5.1 mmol/L   Chloride 108 98 - 111 mmol/L   CO2 24 22 -  32 mmol/L   Glucose, Bld 136 (H) 70 - 99 mg/dL   BUN 19 8 - 23 mg/dL   Creatinine, Ser 1.08 (H) 0.44 - 1.00 mg/dL   Calcium 8.9 8.9 - 10.3 mg/dL   Total Protein 7.1 6.5 - 8.1 g/dL   Albumin 4.1 3.5 - 5.0 g/dL   AST 26 15 - 41 U/L   ALT 18 0 - 44 U/L   Alkaline Phosphatase 49 38 - 126 U/L   Total Bilirubin 1.1 0.3 - 1.2 mg/dL   GFR calc non Af Amer 49 (L) >60 mL/min   GFR calc Af Amer 57 (L) >60 mL/min   Anion gap 10 5 - 15    Comment: Performed at [facility], [place] [address], [place], [place] [address]  Protime-INR     Status: None   Collection Time: 06/20/19  9:13 PM  Result Value Ref Range   Prothrombin Time 12.0 11.4 - 15.2 seconds   INR 0.9 0.8 - 1.2    Comment: (NOTE) INR goal varies based on device and disease states. Performed at [facility], [place] [address], [place], [place] [address]   SARS Coronavirus 2 [facility] order, Performed in [facility] hospital lab) Nasopharyngeal Nasopharyngeal Swab     Status: None   Collection Time: 06/20/19 10:44 PM   Specimen: Nasopharyngeal Swab  Result Value Ref Range   SARS Coronavirus 2 NEGATIVE NEGATIVE    Comment: (NOTE) If result is NEGATIVE SARS-CoV-2 target nucleic acids are NOT DETECTED. The SARS-CoV-2 RNA is generally detectable in upper and lower  respiratory specimens during the acute phase of infection. The lowest  concentration of SARS-CoV-2 viral copies this assay can detect is 250  copies / mL. A negative result does not preclude SARS-CoV-2 infection  and should not be used as the sole basis for treatment or other  patient management decisions.  A negative result may occur with  improper specimen collection / handling, submission of specimen other  than nasopharyngeal swab, presence of viral mutation(s) within the  areas targeted by this assay, and inadequate number of viral copies  (<250 copies / mL). A negative result must be combined with clinical  observations, patient  history, and epidemiological information. If result is POSITIVE SARS-CoV-2 target nucleic acids are DETECTED. The SARS-CoV-2 RNA is generally detectable in upper and  lower  respiratory specimens dur ing the acute phase of infection.  Positive  results are indicative of active infection with SARS-CoV-2.  Clinical  correlation with patient history and other diagnostic information is  necessary to determine patient infection status.  Positive results do  not rule out bacterial infection or co-infection with other viruses. If result is PRESUMPTIVE POSTIVE SARS-CoV-2 nucleic acids MAY BE PRESENT.   A presumptive positive result was obtained on the submitted specimen  and confirmed on repeat testing.  While 2019 novel coronavirus  (SARS-CoV-2) nucleic acids may be present in the submitted sample  additional confirmatory testing may be necessary for epidemiological  and / or clinical management purposes  to differentiate between  SARS-CoV-2 and other Sarbecovirus currently known to infect humans.  If clinically indicated additional testing with an alternate test  methodology [phone]) is advised. The SARS-CoV-2 RNA is generally  detectable in upper and lower respiratory sp ecimens during the acute  phase of infection. The expected result is Negative. Fact Sheet for Patients:  StrictlyIdeas.no Fact Sheet for Healthcare Providers: BankingDealers.co.za This test is not yet approved or cleared by the [place] FDA and has been authorized for detection and/or diagnosis of SARS-CoV-2 by FDA under an Emergency Use Authorization (EUA).  This EUA will remain in effect (meaning this test can be used) for the duration of the COVID-19 declaration under Section 564(b)(1) of the Act, 21 U.S.C. section 360bbb-3(b)(1), unless the authorization is terminated or revoked sooner. Performed at [facility], [place] [address], [place],  [place] [address]   Comprehensive metabolic panel     Status: Abnormal   Collection Time: 06/21/19  5:18 AM  Result Value Ref Range   Sodium 140 135 - 145 mmol/L   Potassium 4.7 3.5 - 5.1 mmol/L   Chloride 106 98 - 111 mmol/L   CO2 26 22 - 32 mmol/L   Glucose, Bld 192 (H) 70 - 99 mg/dL   BUN 18 8 - 23 mg/dL   Creatinine, Ser 1.08 (H) 0.44 - 1.00 mg/dL   Calcium 8.6 (L) 8.9 - 10.3 mg/dL   Total Protein 6.7 6.5 - 8.1 g/dL   Albumin 3.8 3.5 - 5.0 g/dL   AST 26 15 - 41 U/L   ALT 19 0 - 44 U/L   Alkaline Phosphatase 44 38 - 126 U/L   Total Bilirubin 1.1 0.3 - 1.2 mg/dL   GFR calc non Af Amer 49 (L) >60 mL/min   GFR calc Af Amer 57 (L) >60 mL/min   Anion gap 8 5 - 15    Comment: Performed at [facility], [place] [address], [place], [place] [address]  CBC     Status: Abnormal   Collection Time: 06/21/19  5:18 AM  Result Value Ref Range   WBC 9.7 4.0 - 10.5 K/uL   RBC 4.00 3.87 - 5.11 MIL/uL   Hemoglobin 11.9 (L) 12.0 - 15.0 g/dL   HCT 37.9 36.0 - 46.0 %   MCV 94.8 80.0 - 100.0 fL   MCH 29.8 26.0 - 34.0 pg   MCHC 31.4 30.0 - 36.0 g/dL   RDW 12.9 11.5 - 15.5 %   Platelets 139 (L) 150 - 400 K/uL   nRBC 0.0 0.0 - 0.2 %    Comment: Performed at [facility], [place] [address], [place], [place] [address]   Dg Chest 1 View  Result Date: 06/20/2019 CLINICAL DATA:  Fall EXAM: CHEST  1 VIEW COMPARISON:  01/17/2016 FINDINGS:  Mild cardiomegaly with shallow lung inflation. No focal airspace consolidation or pulmonary edema. No pleural effusion. Right axillary surgical clips. IMPRESSION: Mild cardiomegaly without acute airspace disease. Electronically Signed   By: Ulyses Jarred M.D.   On: 06/20/2019 21:43   Dg Knee Complete 4 Views Right  Result Date: 06/20/2019 CLINICAL DATA:  Fall.  Right leg pain EXAM: RIGHT KNEE - COMPLETE 4+ VIEW COMPARISON:  None. FINDINGS: Degenerative changes in the right knee with joint space narrowing and spurring in all 3 compartments.  Chondrocalcinosis. Small joint effusion. No acute bony abnormality. Specifically, no fracture, subluxation, or dislocation. IMPRESSION: Mild-to-moderate degenerative changes. Small joint effusion. No acute bony abnormality. Electronically Signed   By: Rolm Baptise M.D.   On: 06/20/2019 23:43   Dg Hip Unilat W Or Wo Pelvis 2-3 Views Right  Result Date: 06/20/2019 CLINICAL DATA:  Fall down stairs EXAM: DG HIP (WITH OR WITHOUT PELVIS) 2-3V RIGHT COMPARISON:  None. FINDINGS: There is a mildly superiorly displaced fracture of the right femoral neck. The femoral head remains situated in the acetabulum. No other pelvic fracture. IMPRESSION: Mildly displaced fracture of the right femoral neck. Electronically Signed   By: Ulyses Jarred M.D.   On: 06/20/2019 21:41    Pending Labs Unresulted Labs (From admission, onward)   None      Vitals/Pain Today's Vitals   06/21/19 0800 06/21/19 0804 06/21/19 0924 06/21/19 1000  BP: (!) 111/51 (!) 111/51 (!) 111/51 (!) 129/55  Pulse: 73 (!) 126 67 (!) 55  Resp: 17 17 17 14   Temp:      TempSrc:      SpO2: 98% 96% 100% 95%  Weight:      Height:      PainSc:        Isolation Precautions No active isolations  Medications Medications  ALPRAZolam (XANAX) tablet 0.5 mg (has no administration in time range)  meclizine (ANTIVERT) tablet 25 mg (has no administration in time range)  rosuvastatin (CRESTOR) tablet 5 mg (has no administration in time range)  fluticasone (FLONASE) 50 MCG/ACT nasal spray 1-2 spray (has no administration in time range)  Caltrate 600+D Plus Minerals 600-800 MG-UNIT CHEW 1 tablet (has no administration in time range)  amLODipine (NORVASC) tablet 5 mg (has no administration in time range)  dextrose 5 %-0.9 % sodium chloride infusion ( Intravenous Stopped 06/21/19 1129)  ketorolac (TORADOL) 15 MG/ML injection 15 mg (has no administration in time range)  ondansetron (ZOFRAN) tablet 4 mg ( Oral See Alternative 06/21/19 0516)    Or   ondansetron (ZOFRAN) injection 4 mg (4 mg Intravenous Given 06/21/19 0516)  morphine 2 MG/ML injection 2 mg (2 mg Intravenous Given 06/21/19 0923)  morphine 4 MG/ML injection 4 mg (4 mg Intravenous Given 06/20/19 2116)  morphine 4 MG/ML injection 4 mg (4 mg Intravenous Given 06/20/19 2252)    Mobility non-ambulatory
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
## 2019-06-21 NOTE — Progress Notes (Signed)
[facility]                                                                              Patient Demographics  Jennifer Yang, is a 78 y.o. female, DOB - 06/07/1941, [place] date - 06/20/2019   Admitting Physician No admitting provider for patient encounter.  Outpatient Primary MD for the patient is [name], MD  Outpatient specialists:   LOS - 1  days   Medical records reviewed and are as summarized below:    Chief Complaint  Patient presents with  . Fall  . Hip Pain       Brief summary   Jennifer Yang is a 78 y.o. female with medical history significant of osteoporosis, hypertension, history of breast cancer, GERD, hypothyroidism, anxiety disorder with panic attacks and hyperlipidemia who was going out of her house today down the stairs to the car when she missed a step and fell on her right side.  She hit the car with her face sustaining bruising injury to her face around the right eye as well as the nose.  She landed on the right side of her hip.  She came in with excruciating pain 10 out of 10 brought in by EMS.  Her right leg was laterally rotated and x-ray showed right femoral neck fracture.  Patient also is complaining of her right knee pain and swelling.   X-ray of the right knee showed mild degenerative changes but no acute bony abnormalities COVID-19 test negative  Assessment & Plan    Principal Problem:   Right femoral fracture (HCC) -Secondary to mechanical fall, x-ray showed right femoral neck fracture -Orthopedics consulted, seen by Dr. Alvan Dame, plan for possible surgery today -Keep n.p.o., continue gentle hydration -Pain control, DVT prophylaxis per orthopedics  Active Problems:   Breast cancer, right breast ([place]) -In remission    Benign essential HTN -BP currently soft but stable -Continue amlodipine, hold losartan  Fall with facial injuries -Appears to have mild bruising, does not appear to have any bony fractures -  will check x-ray of the facial bones, rule out nasal fracture    Dyslipidemia -Continue statin    Hypothyroidism -  continue continue Synthroid  Thrombocytopenia (HCC) Monitor counts, currently stable  Code Status: Full CODE STATUS DVT Prophylaxis: Heparin subcu Family Communication: Discussed in detail with the patient, all imaging results, lab results explained to the patient and daughter at the bedside  Disposition Plan: PT after the surgery will determine disposition  Time Spent in minutes 35 minutes  Procedures:  None  Consultants:   Orthopedics  Antimicrobials:   Anti-infectives (From admission, onward)   None          Medications  Scheduled Meds: . amLODipine  5 mg Oral Daily  . Caltrate 600+D Plus Minerals  1 tablet Oral Daily  . losartan  100 mg Oral Daily  . rosuvastatin  5 mg Oral Daily   Continuous Infusions: . dextrose 5 % and 0.9% NaCl 75 mL/hr at 06/21/19 0102   PRN Meds:.ALPRAZolam, fluticasone, ketorolac, meclizine, morphine injection, ondansetron **OR** ondansetron (ZOFRAN) IV      Subjective:  Jennifer Yang was seen and examined today.  Complaining of 10/10 right hip pain on moving, needs morphine IV for the pain, lasts about 2 hours.  Having some nausea but no abdominal pain.  No fevers. Patient denies dizziness, chest pain, shortness of breath, abdominal pain.  Objective:   Vitals:   06/21/19 0730 06/21/19 0800 06/21/19 0804 06/21/19 0924  BP:  (!) 111/51 (!) 111/51 (!) 111/51  Pulse: 73 73 (!) 126 67  Resp: 19 17 17 17   Temp:      TempSrc:      SpO2: 95% 98% 96% 100%  Weight:      Height:        Intake/Output Summary (Last 24 hours) at 06/21/2019 1018 Last data filed at 06/21/2019 0515 Gross per 24 hour  Intake -  Output 450 ml  Net -450 ml     Wt Readings from Last 3 Encounters:  06/20/19 87.5 kg  05/25/19 88 kg  05/17/19 88 kg     Exam  General: Alert and oriented x 3, NAD, uncomfortable  Eyes: Bruising and  ecchymosis around the right eye  HEENT:  Atraumatic, normocephalic, normal oropharynx  Cardiovascular: S1 S2 auscultated, no rubs, murmurs or gallops. Regular rate and rhythm.  Respiratory: Clear to auscultation bilaterally, no wheezing, rales or rhonchi  Gastrointestinal: Soft, nontender, nondistended, + bowel sounds  Ext: Laterally rotated right lower extremity  Neuro: Upper extremities 5/5, lower extremities able to wiggle toes  Musculoskeletal: No digital cyanosis, clubbing  Skin: Ecchymosis and bruising over the face and nasal bridge  Psych: Normal affect and demeanor, alert and oriented x3    Data Reviewed:  I have personally reviewed following labs and imaging studies  Micro Results Recent Results (from the past 240 hour(s))  SARS Coronavirus 2 [facility] order, Performed in [place] hospital lab) Nasopharyngeal Nasopharyngeal Swab     Status: None   Collection Time: 06/20/19 10:44 PM   Specimen: Nasopharyngeal Swab  Result Value Ref Range Status   SARS Coronavirus 2 NEGATIVE NEGATIVE Final    Comment: (NOTE) If result is NEGATIVE SARS-CoV-2 target nucleic acids are NOT DETECTED. The SARS-CoV-2 RNA is generally detectable in upper and lower  respiratory specimens during the acute phase of infection. The lowest  concentration of SARS-CoV-2 viral copies this assay can detect is 250  copies / mL. A negative result does not preclude SARS-CoV-2 infection  and should not be used as the sole basis for treatment or other  patient management decisions.  A negative result may occur with  improper specimen collection / handling, submission of specimen other  than nasopharyngeal swab, presence of viral mutation(s) within the  areas targeted by this assay, and inadequate number of viral copies  (<250 copies / mL). A negative result must be combined with clinical  observations, patient history, and epidemiological information. If result is POSITIVE SARS-CoV-2 target nucleic  acids are DETECTED. The SARS-CoV-2 RNA is generally detectable in upper and lower  respiratory specimens dur ing the acute phase of infection.  Positive  results are indicative of active infection with SARS-CoV-2.  Clinical  correlation with patient history and other diagnostic information is  necessary to determine patient infection status.  Positive results do  not rule out bacterial infection or co-infection with other viruses. If result is PRESUMPTIVE POSTIVE SARS-CoV-2 nucleic acids MAY BE PRESENT.   A presumptive positive result was obtained on the submitted specimen  and confirmed on repeat testing.  While 2019 novel coronavirus  (SARS-CoV-2) nucleic  acids may be present in the submitted sample  additional confirmatory testing may be necessary for epidemiological  and / or clinical management purposes  to differentiate between  SARS-CoV-2 and other Sarbecovirus currently known to infect humans.  If clinically indicated additional testing with an alternate test  methodology [phone]) is advised. The SARS-CoV-2 RNA is generally  detectable in upper and lower respiratory sp ecimens during the acute  phase of infection. The expected result is Negative. Fact Sheet for Patients:  StrictlyIdeas.no Fact Sheet for Healthcare Providers: BankingDealers.co.za This test is not yet approved or cleared by the [place] FDA and has been authorized for detection and/or diagnosis of SARS-CoV-2 by FDA under an Emergency Use Authorization (EUA).  This EUA will remain in effect (meaning this test can be used) for the duration of the COVID-19 declaration under Section 564(b)(1) of the Act, 21 U.S.C. section 360bbb-3(b)(1), unless the authorization is terminated or revoked sooner. Performed at [facility], [place] [address] [place], [place] [address]     Radiology Reports Dg Chest 1 View  Result Date: 06/20/2019 CLINICAL DATA:   Fall EXAM: CHEST  1 VIEW COMPARISON:  01/17/2016 FINDINGS: Mild cardiomegaly with shallow lung inflation. No focal airspace consolidation or pulmonary edema. No pleural effusion. Right axillary surgical clips. IMPRESSION: Mild cardiomegaly without acute airspace disease. Electronically Signed   By: Ulyses Jarred M.D.   On: 06/20/2019 21:43   Dg Knee Complete 4 Views Right  Result Date: 06/20/2019 CLINICAL DATA:  Fall.  Right leg pain EXAM: RIGHT KNEE - COMPLETE 4+ VIEW COMPARISON:  None. FINDINGS: Degenerative changes in the right knee with joint space narrowing and spurring in all 3 compartments. Chondrocalcinosis. Small joint effusion. No acute bony abnormality. Specifically, no fracture, subluxation, or dislocation. IMPRESSION: Mild-to-moderate degenerative changes. Small joint effusion. No acute bony abnormality. Electronically Signed   By: Rolm Baptise M.D.   On: 06/20/2019 23:43   Dg Hip Unilat W Or Wo Pelvis 2-3 Views Right  Result Date: 06/20/2019 CLINICAL DATA:  Fall down stairs EXAM: DG HIP (WITH OR WITHOUT PELVIS) 2-3V RIGHT COMPARISON:  None. FINDINGS: There is a mildly superiorly displaced fracture of the right femoral neck. The femoral head remains situated in the acetabulum. No other pelvic fracture. IMPRESSION: Mildly displaced fracture of the right femoral neck. Electronically Signed   By: Ulyses Jarred M.D.   On: 06/20/2019 21:41    Lab Data:  CBC: Recent Labs  Lab 06/20/19 2113 06/21/19 0518  WBC 7.6 9.7  NEUTROABS 5.8  --   HGB 12.6 11.9*  HCT 39.9 37.9  MCV 94.8 94.8  PLT 139* 253*   Basic Metabolic Panel: Recent Labs  Lab 06/20/19 2113 06/21/19 0518  NA 142 140  K 4.1 4.7  CL 108 106  CO2 24 26  GLUCOSE 136* 192*  BUN 19 18  CREATININE 1.08* 1.08*  CALCIUM 8.9 8.6*   GFR: Estimated Creatinine Clearance: 51.4 mL/min (A) (by C-G formula based on SCr of 1.08 mg/dL (H)). Liver Function Tests: Recent Labs  Lab 06/20/19 2113 06/21/19 0518  AST 26 26  ALT  18 19  ALKPHOS 49 44  BILITOT 1.1 1.1  PROT 7.1 6.7  ALBUMIN 4.1 3.8   No results for input(s): LIPASE, AMYLASE in the last 168 hours. No results for input(s): AMMONIA in the last 168 hours. Coagulation Profile: Recent Labs  Lab 06/20/19 2113  INR 0.9   Cardiac Enzymes: No results for input(s): CKTOTAL, CKMB, CKMBINDEX, TROPONINI in the last  168 hours. BNP (last 3 results) No results for input(s): PROBNP in the last 8760 hours. HbA1C: No results for input(s): HGBA1C in the last 72 hours. CBG: No results for input(s): GLUCAP in the last 168 hours. Lipid Profile: No results for input(s): CHOL, HDL, LDLCALC, TRIG, CHOLHDL, LDLDIRECT in the last 72 hours. Thyroid Function Tests: No results for input(s): TSH, T4TOTAL, FREET4, T3FREE, THYROIDAB in the last 72 hours. Anemia Panel: No results for input(s): VITAMINB12, FOLATE, FERRITIN, TIBC, IRON, RETICCTPCT in the last 72 hours. Urine analysis:    Component Value Date/Time   COLORURINE YELLOW 09/03/2016 1300   APPEARANCEUR CLOUDY (A) 09/03/2016 1300   LABSPEC 1.026 09/03/2016 1300   PHURINE 5.5 09/03/2016 1300   GLUCOSEU NEGATIVE 09/03/2016 1300   HGBUR TRACE (A) 09/03/2016 1300   BILIRUBINUR NEGATIVE 09/03/2016 1300   KETONESUR NEGATIVE 09/03/2016 1300   PROTEINUR NEGATIVE 09/03/2016 1300   UROBILINOGEN 0.2 03/02/2008 0915   NITRITE POSITIVE (A) 09/03/2016 1300   LEUKOCYTESUR SMALL (A) 09/03/2016 1300     Blayn Whetsell M.D. [facility] 06/21/2019, 10:18 AM  Pager: [phone] Between 7am to 7pm - call Pager - [phone]  After 7pm go to [email or web address] - password [email or web address]  Call night coverage person covering after 7pm

## 2019-06-21 NOTE — Op Note (Signed)
NAME:  Jennifer Yang                ACCOUNT NO.: [id number]      MEDICAL RECORD NO.: [id number]      FACILITY:  [facility]      PHYSICIAN:  Mauri Pole  DATE OF BIRTH:  1941/10/17     DATE OF PROCEDURE:  06/21/2019                                 OPERATIVE REPORT         PREOPERATIVE DIAGNOSIS: Right hip fracture, displaced femoral neck fracture.      POSTOPERATIVE DIAGNOSIS:  Right hip fracture, displaced femoral neck fracture.      PROCEDURE:  Right total hip replacement through an anterior approach   utilizing DePuy THR system, component size 23mm pinnacle cup, a size 36+4 neutral   Altrex liner, a size 7 Hi Tri Lock stem with a 36+1.5  Articuleze metal head ball.      SURGEON:  Pietro Cassis. Alvan Dame, M.D.      ASSISTANT:  Griffith Citron, PA-C     ANESTHESIA:  General.      SPECIMENS:  None.      COMPLICATIONS:  None.      BLOOD LOSS:  300 cc     DRAINS:  None.      INDICATION OF THE PROCEDURE:  Jennifer Yang is a 78 y.o. female who had   presented to the ER a fall in her garage on to her right hip.  Radiographs in the ER revealed a displaced right femoral neck fracture.  Treatment necessity and options were reviewed with patient and her daughter.  I feel that total hip replacement offers her the best short term and long term treatment option. Consent was obtained for management of her fracture and  benefit of pain relief.  Specific risks of infection, DVT, component   failure, dislocation, neurovascular injury, and need for revision surgery were reviewed in the office as well discussion of   the anterior versus posterior approach were reviewed.     PROCEDURE IN DETAIL:  The patient was brought to operative theater.   Once adequate anesthesia, preoperative antibiotics, 2 gm of Ancef, 1 gm of Tranexamic Acid, and 10 mg of Decadron were administered, the patient was positioned supine on the [employer] table.  Once the patient was safely positioned with  adequate padding of boney prominences we predraped out the hip, and used fluoroscopy to confirm orientation of the pelvis.      The right hip was then prepped and draped from proximal iliac crest to   mid thigh with a shower curtain technique.      Time-out was performed identifying the patient, planned procedure, and the appropriate extremity.     An incision was then made 2 cm lateral to the   anterior superior iliac spine extending over the orientation of the   tensor fascia lata muscle and sharp dissection was carried down to the   fascia of the muscle.      The fascia was then incised.  The muscle belly was identified and swept   laterally and retractor placed along the superior neck.  Following   cauterization of the circumflex vessels and removing some pericapsular   fat, a second cobra retractor was placed on the inferior neck.  A T-capsulotomy was made along the  line of the   superior neck to the trochanteric fossa, then extended proximally and   distally.  Tag sutures were placed and the retractors were then placed   intracapsular.  We then identified the femoral neck fracture and made appropriate neck osteotomy from the trochanteric fossa and   orientation of my neck cut and then made a neck osteotomy with the femur on traction.  The femoral   head and neck fragment were removed without difficulty or complication.  Traction was let   off and retractors were placed posterior and anterior around the   acetabulum.      The labrum and foveal tissue were debrided.  I began reaming with a 45 mm   reamer and reamed up to 51 mm reamer with good bony bed preparation and a 52 mm  cup was chosen.  The final 52 mm Pinnacle cup was then impacted under fluoroscopy to confirm the depth of penetration and orientation with respect to   Abduction and forward flexion.  A screw was placed into the ilium followed by the hole eliminator.  The final   36+4 neutral Altrex liner was impacted with good  visualized rim fit.  The cup was positioned anatomically within the acetabular portion of the pelvis.      At this point, the femur was rolled to 100 degrees.  Further capsule was   released off the inferior aspect of the femoral neck.  I then   released the superior capsule proximally.  With the leg in a neutral position the hook was placed laterally   along the femur under the vastus lateralis origin and elevated manually and then held in position using the hook attachment on the bed.  The leg was then extended and adducted with the leg rolled to 100   degrees of external rotation.  Retractors were placed along the medial calcar and posteriorly over the greater trochanter.  Once the proximal femur was fully   exposed, I used a box osteotome to set orientation.  I then began   broaching with the starting chili pepper broach and passed this by hand and then broached up to 7.  With the 7 broach in place I chose a high offset neck and did several trial reductions.  The offset was appropriate, leg lengths   appeared to be equal best matched with the +1.5 head ball trial confirmed radiographically.   Given these findings, I went ahead and dislocated the hip, repositioned all   retractors and positioned the right hip in the extended and abducted position.  The final 7 HiTri Lock stem was   chosen and it was impacted down to the level of neck cut.  Based on this   and the trial reductions, a final 36+1.5 Articuleze metal ball was chosen and   impacted onto a clean and dry trunnion, and the hip was reduced.  The   hip had been irrigated throughout the case again at this point.  I did   reapproximate the superior capsular leaflet to the anterior leaflet   using #1 Vicryl.  The fascia of the   tensor fascia lata muscle was then reapproximated using #1 Vicryl and #0 Stratafix sutures.  The   remaining wound was closed with 2-0 Vicryl and running 4-0 Monocryl.   The hip was cleaned, dried, and dressed  sterilely using Dermabond and   Aquacel dressing.  The patient was then brought   to recovery room in stable condition tolerating the procedure  well.    Griffith Citron, PA-C was present for the entirety of the case involved from   preoperative positioning, perioperative retractor management, general   facilitation of the case, as well as primary wound closure as assistant.            Pietro Cassis Alvan Dame, M.D.        06/21/2019 4:45 PM

## 2019-06-21 NOTE — Anesthesia Procedure Notes (Signed)
Procedure Name: Intubation Date/Time: 06/21/2019 6:08 PM Performed by: West Pugh, CRNA Pre-anesthesia Checklist: Patient identified, Emergency Drugs available, Suction available, Patient being monitored and Timeout performed Patient Re-evaluated:Patient Re-evaluated prior to induction Oxygen Delivery Method: Circle system utilized Preoxygenation: Pre-oxygenation with 100% oxygen Induction Type: IV induction and Rapid sequence Laryngoscope Size: Mac and 3 Grade View: Grade II Tube type: Oral Tube size: 7.0 mm Number of attempts: 1 Airway Equipment and Method: Stylet Placement Confirmation: ETT inserted through vocal cords under direct vision,  positive ETCO2,  CO2 detector and breath sounds checked- equal and bilateral Secured at: 22 cm Tube secured with: Tape Dental Injury: Teeth and Oropharynx as per pre-operative assessment

## 2019-06-21 NOTE — ED Notes (Signed)
Staff again attempted to get pt to move into a hospital bed for comfort due to holding. Pt stated she would like to stay on the ED stretcher

## 2019-06-21 NOTE — Progress Notes (Signed)
Patient just left to go to Pre Op.  ED linen still under patient because patient and daughter refused to let us get it out.

## 2019-06-21 NOTE — H&P (Signed)
History and Physical   Jennifer Yang GQQ:[id number] DOB: 10-03-1941 DOA: 06/20/2019  Referring MD/NP/PA: Dr. Darl Householder  PCP: [name], MD   Outpatient Specialists: None  Patient coming from: Home  Chief Complaint: Fall with right hip pain  HPI: Jennifer Yang is a 78 y.o. female with medical history significant of osteoporosis, hypertension, history of breast cancer, GERD, hypothyroidism, anxiety disorder with panic attacks and hyperlipidemia who was going out of her house today down the stairs to the car when she missed a step and fell on her right side.  She hit the car with her face sustaining bruising injury to her face around the right eye as well as the nose.  She landed on the right side of her hip.  She came in with excruciating pain 10 out of 10 brought in by EMS.  Her right leg was laterally rotated and x-ray count 5 right femoral neck fracture.  Patient also is complaining of her right knee pain and swelling.  Patient to be evaluated by orthopedic surgery for surgical reduction.  Admitted to medical service for medical clearance and comanagement..  ED Course: Temperature is 98.4 blood pressure 140/54 pulse 83 respirate of 22 oxygen sat 94% on room air.  White count 7.6 hemoglobin 12.6 and platelets 139.  Sodium 142 potassium 4.1 creatinine 1.08 and BUN 19.  Glucose 136.  COVID-19 testing is negative.  Chest x-ray showed mild cardiomegaly otherwise no acute findings.  X-ray of the right hip showed mildly displaced fracture of the right femoral neck.  X-ray of the right knee showed mild degenerative changes with small joint effusion but no acute bony abnormalities.  Patient is being admitted to the hospital.  Review of Systems: As per HPI otherwise 10 point review of systems negative.    Past Medical History:  Diagnosis Date  . Allergy    shell fish  . Anxiety    takes xanax on a rare occasion  . Arthritis    knees  . Benign essential HTN 11/24/2011  . Bladder infection 03/18/15   completed antibotics   . Breast cancer, right breast ([place]) 02/16/2008  . Breast cancer, stage 1 ([place]) 07/16/1990  . Cancer [facility])    right Chemo/radiation '01/radical '09  . Cataract   . Contact lens/glasses fitting   . 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 Family history of breast cancer    mother  . GERD (gastroesophageal reflux disease)   . Hyperlipidemia   . Hypertension   . Lymphedema of arm 11/24/2011   right arm  . MVP (mitral valve prolapse)   . PONV (postoperative nausea and vomiting)    has vertigo also-would like scop patch  . Syncope 2019   after colonoscopy states "dehydrated"  . Thyroid disease   . Vertigo     Past Surgical History:  Procedure Laterality Date  . BREAST LUMPECTOMY     2001 right  . CATARACT EXTRACTION, BILATERAL    . COLONOSCOPY  2006   w/Dr.Orr  . COLONOSCOPY  2019   fainted afterward at home  . FOOT AMPUTATION     right hammer toe  . FOOT NEUROMA SURGERY     left  . KNEE ARTHROSCOPY     left  . MASS EXCISION  07/21/2012   Procedure: EXCISION MASS;  Surgeon: Adin Hector, MD;  Location: [facility];  Service: General;  Laterality: N/A;  excision 15cm posterior neck mass posterior  . MASTECTOMY    . MASTECTOMY, RADICAL  2009 right  . THYROIDECTOMY, PARTIAL    . UPPER GASTROINTESTINAL ENDOSCOPY       reports that she has never smoked. She has never used smokeless tobacco. She reports that she does not drink alcohol or use drugs.  Allergies  Allergen Reactions  . Codeine Sulfate Swelling  . Fish-Derived Products Other (See Comments)    Cellulitis   . Rocephin [Ceftriaxone Sodium] Other (See Comments)    Low blood count  . Sulfa Antibiotics Other (See Comments)    Mouth and lip sores    Family History  Problem Relation Age of Onset  . Cancer Mother   . Heart disease Mother   . Heart disease Father   . Heart disease Brother   . Colon cancer Cousin   . Stomach cancer Neg Hx   . Rectal cancer Neg Hx   .  Pancreatic cancer Neg Hx   . Esophageal cancer Neg Hx      Prior to Admission medications   Medication Sig Start Date End Date Taking? Authorizing Provider  ALPRAZolam Duanne Moron) 0.5 MG tablet Take 0.5 mg by mouth daily as needed for anxiety.    Yes [provider]  amLODipine (NORVASC) 5 MG tablet Take 5 mg by mouth daily. 05/19/18  Yes [provider]  aspirin 81 MG EC tablet Take 81 mg by mouth at bedtime.    Yes [provider]  Calcium Carbonate-Vit D-Min (CALTRATE 600+D PLUS MINERALS) 600-800 MG-UNIT CHEW Chew 1 tablet by mouth daily.    Yes [provider]  fluticasone (FLONASE) 50 MCG/ACT nasal spray Place 1-2 sprays into both nostrils daily as needed for allergies or rhinitis.   Yes [provider]  losartan (COZAAR) 100 MG tablet Take 100 mg by mouth daily.    Yes [provider]  meclizine (ANTIVERT) 25 MG tablet Take 25 mg by mouth 3 (three) times daily as needed for dizziness.    Yes [provider]  rosuvastatin (CRESTOR) 5 MG tablet Take 5 mg by mouth daily. 09/01/16  Yes [provider]  Zoledronic Acid (ZOMETA IV) Inject 1 each into the skin See admin instructions. One a year   Yes [provider]  cetirizine (ZYRTEC) 10 MG tablet Take 1 tablet (10 mg total) by mouth daily. 1 po q day prn allergies Patient not taking: Reported on 06/20/2019 11/15/16   Tanna Furry, MD  ondansetron (ZOFRAN) 4 MG tablet Take 1 tablet (4 mg total) by mouth every 8 (eight) hours as needed for nausea or vomiting. Patient not taking: Reported on 06/20/2019 05/21/18   Mauri Pole, MD    Physical Exam: Vitals:   06/20/19 2058 06/20/19 2205 06/20/19 2230 06/20/19 2300  BP:  (!) 142/60 (!) 148/54 138/60  Pulse:  67 66 83  Resp:  14 (!) 22 15  SpO2:  96% 94% 94%  Weight: 87.5 kg     Height: 5\' 9"  (1.753 m)         Constitutional: Acutely ill looking, mild distress due to pain Vitals:   06/20/19 2058 06/20/19 2205  06/20/19 2230 06/20/19 2300  BP:  (!) 142/60 (!) 148/54 138/60  Pulse:  67 66 83  Resp:  14 (!) 22 15  SpO2:  96% 94% 94%  Weight: 87.5 kg     Height: 5\' 9"  (1.753 m)      Eyes: PERRL, conjunctivae normal, ecchymosis around the right eye ENMT: Mucous membranes are moist. Posterior pharynx clear of any exudate or lesions.Normal dentition.  Neck: normal, supple, no masses, no thyromegaly Respiratory: clear to auscultation bilaterally, no wheezing, no crackles. Normal respiratory effort. No accessory muscle use.  Cardiovascular: Regular rate and rhythm, no murmurs / rubs / gallops. No extremity edema. 2+ pedal pulses. No carotid bruits.  Abdomen: no tenderness, no masses palpated. No hepatosplenomegaly. Bowel sounds positive.  Musculoskeletal: Laterally rotated right lower extremity, ecchymosis with tenderness in the greater trochanter area, swollen right knee with fluctuance, normal muscle tone.  Skin: Ecchymosis in the face and bruise over the the nose bridge no rashes, lesions, ulcers. No induration Neurologic: CN 2-12 grossly intact. Sensation intact, DTR normal. Strength 5/5 in all 4.  Psychiatric: Normal judgment and insight. Alert and oriented x 3. Normal mood.     Labs on Admission: I have personally reviewed following labs and imaging studies  CBC: Recent Labs  Lab 06/20/19 2113  WBC 7.6  NEUTROABS 5.8  HGB 12.6  HCT 39.9  MCV 94.8  PLT 353*   Basic Metabolic Panel: Recent Labs  Lab 06/20/19 2113  NA 142  K 4.1  CL 108  CO2 24  GLUCOSE 136*  BUN 19  CREATININE 1.08*  CALCIUM 8.9   GFR: Estimated Creatinine Clearance: 51.4 mL/min (A) (by C-G formula based on SCr of 1.08 mg/dL (H)). Liver Function Tests: Recent Labs  Lab 06/20/19 2113  AST 26  ALT 18  ALKPHOS 49  BILITOT 1.1  PROT 7.1  ALBUMIN 4.1   No results for input(s): LIPASE, AMYLASE in the last 168 hours. No results for input(s): AMMONIA in the last 168 hours. Coagulation Profile: Recent Labs   Lab 06/20/19 2113  INR 0.9   Cardiac Enzymes: No results for input(s): CKTOTAL, CKMB, CKMBINDEX, TROPONINI in the last 168 hours. BNP (last 3 results) No results for input(s): PROBNP in the last 8760 hours. HbA1C: No results for input(s): HGBA1C in the last 72 hours. CBG: No results for input(s): GLUCAP in the last 168 hours. Lipid Profile: No results for input(s): CHOL, HDL, LDLCALC, TRIG, CHOLHDL, LDLDIRECT in the last 72 hours. Thyroid Function Tests: No results for input(s): TSH, T4TOTAL, FREET4, T3FREE, THYROIDAB in the last 72 hours. Anemia Panel: No results for input(s): VITAMINB12, FOLATE, FERRITIN, TIBC, IRON, RETICCTPCT in the last 72 hours. Urine analysis:    Component Value Date/Time   COLORURINE YELLOW 09/03/2016 1300   APPEARANCEUR CLOUDY (A) 09/03/2016 1300   LABSPEC 1.026 09/03/2016 1300   PHURINE 5.5 09/03/2016 1300   GLUCOSEU NEGATIVE 09/03/2016 1300   HGBUR TRACE (A) 09/03/2016 1300   BILIRUBINUR NEGATIVE 09/03/2016 1300   KETONESUR NEGATIVE 09/03/2016 1300   PROTEINUR NEGATIVE 09/03/2016 1300   UROBILINOGEN 0.2 03/02/2008 0915   NITRITE POSITIVE (A) 09/03/2016 1300   LEUKOCYTESUR SMALL (A) 09/03/2016 1300   Sepsis Labs: @LABRCNTIP (procalcitonin:4,lacticidven:4) ) Recent Results (from the past 240 hour(s))  SARS Coronavirus 2 [facility] order, Performed in [facility] hospital lab) Nasopharyngeal Nasopharyngeal Swab     Status: None   Collection Time: 06/20/19 10:44 PM   Specimen: Nasopharyngeal Swab  Result Value Ref Range Status   SARS Coronavirus 2 NEGATIVE NEGATIVE Final    Comment: (NOTE) If result is NEGATIVE SARS-CoV-2 target nucleic acids are NOT DETECTED. The SARS-CoV-2 RNA is generally detectable in upper and lower  respiratory specimens during the acute phase of infection. The lowest  concentration of SARS-CoV-2 viral copies this assay can detect is 250  copies / mL. A negative result does not preclude SARS-CoV-2 infection  and should  not be used as  the sole basis for treatment or other  patient management decisions.  A negative result may occur with  improper specimen collection / handling, submission of specimen other  than nasopharyngeal swab, presence of viral mutation(s) within the  areas targeted by this assay, and inadequate number of viral copies  (<250 copies / mL). A negative result must be combined with clinical  observations, patient history, and epidemiological information. If result is POSITIVE SARS-CoV-2 target nucleic acids are DETECTED. The SARS-CoV-2 RNA is generally detectable in upper and lower  respiratory specimens dur ing the acute phase of infection.  Positive  results are indicative of active infection with SARS-CoV-2.  Clinical  correlation with patient history and other diagnostic information is  necessary to determine patient infection status.  Positive results do  not rule out bacterial infection or co-infection with other viruses. If result is PRESUMPTIVE POSTIVE SARS-CoV-2 nucleic acids MAY BE PRESENT.   A presumptive positive result was obtained on the submitted specimen  and confirmed on repeat testing.  While 2019 novel coronavirus  (SARS-CoV-2) nucleic acids may be present in the submitted sample  additional confirmatory testing may be necessary for epidemiological  and / or clinical management purposes  to differentiate between  SARS-CoV-2 and other Sarbecovirus currently known to infect humans.  If clinically indicated additional testing with an alternate test  methodology [phone]) is advised. The SARS-CoV-2 RNA is generally  detectable in upper and lower respiratory sp ecimens during the acute  phase of infection. The expected result is Negative. Fact Sheet for Patients:  StrictlyIdeas.no Fact Sheet for Healthcare Providers: BankingDealers.co.za This test is not yet approved or cleared by the [place] FDA and has been  authorized for detection and/or diagnosis of SARS-CoV-2 by FDA under an Emergency Use Authorization (EUA).  This EUA will remain in effect (meaning this test can be used) for the duration of the COVID-19 declaration under Section 564(b)(1) of the Act, 21 U.S.C. section 360bbb-3(b)(1), unless the authorization is terminated or revoked sooner. Performed at [facility], [place] [address] [place], [place] [address]      Radiological Exams on Admission: Dg Chest 1 View  Result Date: 06/20/2019 CLINICAL DATA:  Fall EXAM: CHEST  1 VIEW COMPARISON:  01/17/2016 FINDINGS: Mild cardiomegaly with shallow lung inflation. No focal airspace consolidation or pulmonary edema. No pleural effusion. Right axillary surgical clips. IMPRESSION: Mild cardiomegaly without acute airspace disease. Electronically Signed   By: Ulyses Jarred M.D.   On: 06/20/2019 21:43   Dg Knee Complete 4 Views Right  Result Date: 06/20/2019 CLINICAL DATA:  Fall.  Right leg pain EXAM: RIGHT KNEE - COMPLETE 4+ VIEW COMPARISON:  None. FINDINGS: Degenerative changes in the right knee with joint space narrowing and spurring in all 3 compartments. Chondrocalcinosis. Small joint effusion. No acute bony abnormality. Specifically, no fracture, subluxation, or dislocation. IMPRESSION: Mild-to-moderate degenerative changes. Small joint effusion. No acute bony abnormality. Electronically Signed   By: Rolm Baptise M.D.   On: 06/20/2019 23:43   Dg Hip Unilat W Or Wo Pelvis 2-3 Views Right  Result Date: 06/20/2019 CLINICAL DATA:  Fall down stairs EXAM: DG HIP (WITH OR WITHOUT PELVIS) 2-3V RIGHT COMPARISON:  None. FINDINGS: There is a mildly superiorly displaced fracture of the right femoral neck. The femoral head remains situated in the acetabulum. No other pelvic fracture. IMPRESSION: Mildly displaced fracture of the right femoral neck. Electronically Signed   By: Ulyses Jarred M.D.   On: 06/20/2019 21:41    EKG: Independently  reviewed.  It shows normal sinus rhythm with a rate of 87.  No significant ST changes.  Assessment/Plan Principal Problem:   Right femoral fracture (HCC) Active Problems:   Breast cancer, right breast (HCC)   Benign essential HTN   Drug-induced osteoporosis   Dyslipidemia   Hypothyroidism   Thrombocytopenia (HCC)     #1 right femoral neck fracture: Traumatic.  Mildly displaced.  Patient will be admitted.  Pain management.  Keep n.p.o. after midnight.  Hold anticoagulation in anticipation of possible surgery tomorrow.  Defer to orthopedics.  She has no significant cardiac history.  Her risk factors are mild to moderate.  She is medically stable to undergo this type of surgery.  #2 hypertension: Continue blood pressure medications from home and adjust as necessary.  #3 osteoporosis: As a result of treatment for breast cancer.  She is currently in remission from that.  Continue Zometa and other medications in the outpatient setting.  #4 hyperlipidemia: Continue with home regimen.  #5 history of right breast cancer: In remission.  #6 thrombocytopenia: Monitor closely with heparin products.  #7 hypothyroidism: Follow home regimen.  #8 fall with facial injuries: Patient has injuries to her nose and right eye which is mild bruising.  Does not appear to have any bony fractures.  Monitor and manage conservatively.   DVT prophylaxis: SCD Code Status: Full code Family Communication: Daughter at bedside Disposition Plan: To be determined Consults called: Dr. Alvan Dame, orthopedic surgery Admission status: Inpatient  Severity of Illness: The appropriate patient status for this patient is INPATIENT. Inpatient status is judged to be reasonable and necessary in order to provide the required intensity of service to ensure the patient's safety. The patient's presenting symptoms, physical exam findings, and initial radiographic and laboratory data in the context of their chronic comorbidities is felt  to place them at high risk for further clinical deterioration. Furthermore, it is not anticipated that the patient will be medically stable for discharge from the hospital within 2 midnights of admission. The following factors support the patient status of inpatient.   " The patient's presenting symptoms include pain in the right hip. " The worrisome physical exam findings include ecchymosis with lateral rotation of the right hip. " The initial radiographic and laboratory data are worrisome because of x-ray showing femoral neck fracture. " The chronic co-morbidities include osteoporosis.   * I certify that at the point of admission it is my clinical judgment that the patient will require inpatient hospital care spanning beyond 2 midnights from the point of admission due to high intensity of service, high risk for further deterioration and high frequency of surveillance required.Barbette Merino MD [facility] Pager [phone]  If 7PM-7AM, please contact night-coverage [email or web address] Password [email or web address]  06/21/2019, 12:11 AM

## 2019-06-21 NOTE — Discharge Instructions (Signed)

## 2019-06-21 NOTE — ED Notes (Signed)
Staff offered to transfer pt to a hospital bed due to high census and holding in the ED. Pt refused and stated she would rather not move to a different bed due to hip pain.

## 2019-06-21 NOTE — ED Notes (Addendum)
Pt repositioned for comfort

## 2019-06-21 NOTE — Anesthesia Postprocedure Evaluation (Signed)
Anesthesia Post Note  Patient: Jennifer Yang  Procedure(s) Performed: TOTAL HIP ARTHROPLASTY ANTERIOR APPROACH (Right Hip)     Patient location during evaluation: PACU Anesthesia Type: General Level of consciousness: awake and alert Pain management: pain level controlled Vital Signs Assessment: post-procedure vital signs reviewed and stable Respiratory status: spontaneous breathing, nonlabored ventilation, respiratory function stable and patient connected to nasal cannula oxygen Cardiovascular status: blood pressure returned to baseline and stable Postop Assessment: no apparent nausea or vomiting Anesthetic complications: no    Last Vitals:  Vitals:   06/21/19 2045 06/21/19 2100  BP: 137/76 (!) 147/69  Pulse: (!) 104 89  Resp: 17 14  Temp:    SpO2: 97% 96%    Last Pain:  Vitals:   06/21/19 2100  TempSrc:   PainSc: 0-No pain    LLE Motor Response: Purposeful movement;Responds to commands (06/21/19 2100) LLE Sensation: Full sensation (06/21/19 2100) RLE Motor Response: Purposeful movement;Responds to commands (06/21/19 2100) RLE Sensation: Full sensation (06/21/19 2100)      Joanie Duprey,W. EDMOND

## 2019-06-22 ENCOUNTER — Encounter (HOSPITAL_COMMUNITY): Payer: Self-pay | Admitting: Orthopedic Surgery

## 2019-06-22 LAB — CBC
HCT: 37 % (ref 36.0–46.0)
Hemoglobin: 11.5 g/dL — ABNORMAL LOW (ref 12.0–15.0)
MCH: 29.8 pg (ref 26.0–34.0)
MCHC: 31.1 g/dL (ref 30.0–36.0)
MCV: 95.9 fL (ref 80.0–100.0)
Platelets: 118 10*3/uL — ABNORMAL LOW (ref 150–400)
RBC: 3.86 MIL/uL — ABNORMAL LOW (ref 3.87–5.11)
RDW: 12.8 % (ref 11.5–15.5)
WBC: 10.1 10*3/uL (ref 4.0–10.5)
nRBC: 0 % (ref 0.0–0.2)

## 2019-06-22 LAB — BASIC METABOLIC PANEL
Anion gap: 12 (ref 5–15)
BUN: 15 mg/dL (ref 8–23)
CO2: 19 mmol/L — ABNORMAL LOW (ref 22–32)
Calcium: 8.3 mg/dL — ABNORMAL LOW (ref 8.9–10.3)
Chloride: 105 mmol/L (ref 98–111)
Creatinine, Ser: 1.01 mg/dL — ABNORMAL HIGH (ref 0.44–1.00)
GFR calc Af Amer: >60 mL/min (ref >60)
GFR calc non Af Amer: 54 mL/min — ABNORMAL LOW (ref >60)
Glucose, Bld: 258 mg/dL — ABNORMAL HIGH (ref 70–99)
Potassium: 4.4 mmol/L (ref 3.5–5.1)
Sodium: 136 mmol/L (ref 135–145)

## 2019-06-22 MED ORDER — ASPIRIN 81 MG PO CHEW: 81.0000 mg | CHEWABLE_TABLET | Freq: Two times a day (BID) | ORAL | 0 refills | Status: AC

## 2019-06-22 MED ORDER — TRAMADOL HCL 50 MG PO TABS: 50.0000 mg | ORAL_TABLET | Freq: Four times a day (QID) | ORAL | 0 refills | Status: DC | PRN

## 2019-06-22 MED ORDER — METHOCARBAMOL 500 MG PO TABS: 500.0000 mg | ORAL_TABLET | Freq: Four times a day (QID) | ORAL | 0 refills | Status: DC | PRN

## 2019-06-22 MED ORDER — ACETAMINOPHEN 500 MG PO TABS: 1000.0000 mg | ORAL_TABLET | Freq: Three times a day (TID) | ORAL | 0 refills | Status: DC

## 2019-06-22 MED ORDER — FERROUS SULFATE 325 (65 FE) MG PO TABS: 325.0000 mg | ORAL_TABLET | Freq: Three times a day (TID) | ORAL | 0 refills | Status: DC

## 2019-06-22 MED ORDER — POLYETHYLENE GLYCOL 3350 17 G PO PACK: 17.0000 g | PACK | Freq: Two times a day (BID) | ORAL | 0 refills | Status: DC

## 2019-06-22 MED ORDER — AMLODIPINE BESYLATE 5 MG PO TABS
5.0000 mg | ORAL_TABLET | Freq: Every day | ORAL | Status: DC
  Administered 2019-06-23 – 2019-06-24 (×2): 5 mg via ORAL
  Filled 2019-06-22 (×2): qty 1

## 2019-06-22 MED ORDER — DOCUSATE SODIUM 100 MG PO CAPS: 100.0000 mg | ORAL_CAPSULE | Freq: Two times a day (BID) | ORAL | 0 refills | Status: DC

## 2019-06-22 MED ORDER — POLYETHYLENE GLYCOL 3350 17 G PO PACK: 17.0000 g | PACK | Freq: Every day | ORAL | Status: DC | PRN

## 2019-06-22 NOTE — Plan of Care (Signed)
  Problem: Education: Goal: Verbalization of understanding the information provided (i.e., activity precautions, restrictions, etc) will improve Outcome: Progressing Goal: Individualized Educational Video(s) Outcome: Progressing   Problem: Activity: Goal: Ability to ambulate and perform ADLs will improve Outcome: Progressing   Problem: Clinical Measurements: Goal: Postoperative complications will be avoided or minimized Outcome: Progressing   Problem: Self-Concept: Goal: Ability to maintain and perform role responsibilities to the fullest extent possible will improve Outcome: Progressing   Problem: Pain Management: Goal: Pain level will decrease Outcome: Progressing   Problem: Health Behavior/Discharge Planning: Goal: Ability to manage health-related needs will improve Outcome: Progressing   Problem: Clinical Measurements: Goal: Will remain free from infection Outcome: Progressing Goal: Respiratory complications will improve Outcome: Progressing Goal: Cardiovascular complication will be avoided Outcome: Progressing   Problem: Coping: Goal: Level of anxiety will decrease Outcome: Progressing   Problem: Education: Goal: Knowledge of the prescribed therapeutic regimen will improve Outcome: Progressing Goal: Understanding of discharge needs will improve Outcome: Progressing Goal: Individualized Educational Video(s) Outcome: Progressing   Problem: Activity: Goal: Ability to avoid complications of mobility impairment will improve Outcome: Progressing Goal: Ability to tolerate increased activity will improve Outcome: Progressing   Problem: Clinical Measurements: Goal: Postoperative complications will be avoided or minimized Outcome: Progressing   Problem: Pain Management: Goal: Pain level will decrease with appropriate interventions Outcome: Progressing   Problem: Skin Integrity: Goal: Will show signs of wound healing Outcome: Progressing   Problem:  Education: Goal: Knowledge of the prescribed therapeutic regimen will improve Outcome: Progressing Goal: Understanding of discharge needs will improve Outcome: Progressing Goal: Individualized Educational Video(s) Outcome: Progressing   Problem: Activity: Goal: Ability to avoid complications of mobility impairment will improve Outcome: Progressing Goal: Ability to tolerate increased activity will improve Outcome: Progressing   Problem: Clinical Measurements: Goal: Postoperative complications will be avoided or minimized Outcome: Progressing   Problem: Pain Management: Goal: Pain level will decrease with appropriate interventions Outcome: Progressing   Problem: Skin Integrity: Goal: Will show signs of wound healing Outcome: Progressing

## 2019-06-22 NOTE — Evaluation (Signed)
Physical Therapy Evaluation Patient Details Name: Jennifer Yang MRN: [id number] DOB: 21-May-1941 Today's Date: 06/22/2019   History of Present Illness  78 yo female admitted with R femur fx. 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 She was able to stand and pivot from bed to bsc with a RW. Moderate pain with activity. Will follow and progress activity as tolerated.     Follow Up Recommendations Home health PT;Supervision/Assistance - 24 hour    Equipment Recommendations  Rolling walker with 5" wheels;3in1 (PT)    Recommendations for Other Services       Precautions / Restrictions Precautions Precautions: Fall Restrictions Weight Bearing Restrictions: No Other Position/Activity Restrictions: WBAT      Mobility  Bed Mobility Overal bed mobility: Needs Assistance Bed Mobility: Supine to Sit     Supine to sit: Mod assist;HOB elevated     General bed mobility comments: Assist for R LE, trunk to upright, and scoot to EOB. Increased time. Cues for technique.  Transfers Overall transfer level: Needs assistance Equipment used: Rolling walker (2 wheeled) Transfers: Sit to/from [facility] Sit to Stand: Min assist;From elevated surface Stand pivot transfers: Min assist       General transfer comment: Assist to rise, stabilize, control descent. VCs safety, technique, hand/LE placement. Increased time. Stand pivot, bed to recliner  Ambulation/Gait             General Gait Details: NT-nauseous and fatigued.  Stairs            Wheelchair Mobility    Modified Rankin (Stroke Patients Only)       Balance Overall balance assessment: Needs assistance         Standing balance support: Bilateral upper extremity supported Standing balance-Leahy Scale: Poor                               Pertinent Vitals/Pain Pain Assessment: 0-10 Pain Score: 7  Pain Location: R  hip/thigh Pain Descriptors / Indicators: Aching;Sore Pain Intervention(s): Monitored during session;Limited activity within patient's tolerance;Ice applied    Home Living                        Prior Function                 Hand Dominance        Extremity/Trunk Assessment   Upper Extremity Assessment Upper Extremity Assessment: Overall WFL for tasks assessed    Lower Extremity Assessment Lower Extremity Assessment: Generalized weakness    Cervical / Trunk Assessment Cervical / Trunk Assessment: Normal  Communication      Cognition Arousal/Alertness: Awake/alert Behavior During Therapy: WFL for tasks assessed/performed Overall Cognitive Status: Within Functional Limits for tasks assessed                                        General Comments      Exercises     Assessment/Plan    PT Assessment Patient needs continued PT services  PT Problem List Decreased strength;Decreased mobility;Decreased range of motion;Decreased activity tolerance;Decreased balance;Decreased knowledge of use of DME;Pain       PT Treatment Interventions DME instruction;Gait training;Therapeutic activities;Therapeutic exercise;Patient/family education;Balance training;Functional mobility training    PT Goals (Current goals can be found in the Care  Plan section)  Acute Rehab PT Goals Patient Stated Goal: less pain. regain independence/plof PT Goal Formulation: With patient Time For Goal Achievement: 07/06/19 Potential to Achieve Goals: Good    Frequency Min 5X/week   Barriers to discharge        Co-evaluation               AM-PAC PT "6 Clicks" Mobility  Outcome Measure Help needed turning from your back to your side while in a flat bed without using bedrails?: A Little Help needed moving from lying on your back to sitting on the side of a flat bed without using bedrails?: A Little Help needed moving to and from a bed to a chair (including a  wheelchair)?: A Little Help needed standing up from a chair using your arms (e.g., wheelchair or bedside chair)?: A Little Help needed to walk in hospital room?: A Little Help needed climbing 3-5 steps with a railing? : A Little 6 Click Score: 18    End of Session Equipment Utilized During Treatment: Gait belt Activity Tolerance: Patient tolerated treatment well Patient left: in chair;with call bell/phone within reach   PT Visit Diagnosis: Other abnormalities of gait and mobility (R26.89);Pain Pain - Right/Left: Right Pain - part of body: Leg    Time: 7711-6579 PT Time Calculation (min) (ACUTE ONLY): 18 min   Charges:   PT Evaluation $PT Eval Low Complexity: [place], PT Acute Rehabilitation Services Pager: [phone] Office: [phone]

## 2019-06-22 NOTE — Progress Notes (Signed)
Physical Therapy Treatment Patient Details Name: Jennifer Yang MRN: [id number] DOB: October 22, 1941 Today's Date: 06/22/2019    History of Present Illness 78 yo female admitted with R femur fx. 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    Follow Up Recommendations  Home health PT;Supervision/Assistance - 24 hour     Equipment Recommendations  Rolling walker with 5" wheels    Recommendations for Other Services       Precautions / Restrictions Precautions Precautions: Fall Restrictions Weight Bearing Restrictions: No Other Position/Activity Restrictions: WBAT    Mobility  Bed Mobility Overal bed mobility: Needs Assistance Bed Mobility: Sit to Supine      Sit to supine: Min assist   General bed mobility comments: Assist for R LE. Increased time. Cues for technique. Pt relied on trapeze for repositioning.  Transfers Overall transfer level: Needs assistance Equipment used: Rolling walker (2 wheeled) Transfers: Sit to/from Stand Sit to Stand: Min assist Stand pivot transfers: Min assist       General transfer comment: Assist to rise, stabilize, control descent. VCs safety, technique, hand/LE placement. Increased time.  Ambulation/Gait Ambulation/Gait assistance: Min assist Gait Distance (Feet): 15 Feet Assistive device: Rolling walker (2 wheeled) Gait Pattern/deviations: Step-to pattern     General Gait Details: VCs safety, technique, sequence. Slow gait speed. Followed with recliner for safety. Pt fatigues fairly easily.   Stairs             Wheelchair Mobility    Modified Rankin (Stroke Patients Only)       Balance Overall balance assessment: Needs assistance         Standing balance support: Bilateral upper extremity supported Standing balance-Leahy Scale: Poor                              Cognition Arousal/Alertness: Awake/alert Behavior During Therapy: WFL for tasks  assessed/performed Overall Cognitive Status: Within Functional Limits for tasks assessed                                        Exercises      General Comments        Pertinent Vitals/Pain Pain Assessment: 0-10 Pain Score: 7  Pain Location: R hip/thigh Pain Descriptors / Indicators: Aching;Sore Pain Intervention(s): Monitored during session;Limited activity within patient's tolerance;Repositioned;Ice applied    Home Living                      Prior Function            PT Goals (current goals can now be found in the care plan section) Acute Rehab PT Goals Patient Stated Goal: less pain. regain independence/plof PT Goal Formulation: With patient Time For Goal Achievement: 07/06/19 Potential to Achieve Goals: Good Progress towards PT goals: Progressing toward goals    Frequency    7X/week      PT Plan Current plan remains appropriate    Co-evaluation              AM-PAC PT "6 Clicks" Mobility   Outcome Measure  Help needed turning from your back to your side while in a flat bed without using bedrails?: A Little Help needed moving from lying on your back to sitting on the side of a flat bed without using bedrails?: A  Little Help needed moving to and from a bed to a chair (including a wheelchair)?: A Little Help needed standing up from a chair using your arms (e.g., wheelchair or bedside chair)?: A Little Help needed to walk in hospital room?: A Little Help needed climbing 3-5 steps with a railing? : A Little 6 Click Score: 18    End of Session Equipment Utilized During Treatment: Gait belt Activity Tolerance: Patient tolerated treatment well Patient left: in bed;with call bell/phone within reach;with family/visitor present   PT Visit Diagnosis: Other abnormalities of gait and mobility (R26.89);Pain Pain - Right/Left: Right Pain - part of body: Leg     Time: 7505-1071 PT Time Calculation (min) (ACUTE ONLY): 33  min  Charges:  $Gait Training: 8-22 mins $Therapeutic Activity: 8-22 mins                       Weston Anna, PT Acute Rehabilitation Services Pager: [phone] Office: [phone]

## 2019-06-22 NOTE — Progress Notes (Addendum)
     Subjective: 1 Day Post-Op Procedure(s) (LRB): TOTAL HIP ARTHROPLASTY ANTERIOR APPROACH (Right)   Patient reports pain as mild, pain controlled. Requested that I look at her knee as it is little more painful than the hip.  Patient has bruising over the anterior aspect of the right knee and mild swelling, but no significant effusion.  No instability noted with varus or valgus stress testing.  Patient is able to straighten her leg fully and no defects noted neither the patellar quad tendon areas.  Difficult to assess full flexion with her resting in the bed.  We have discussed that the x-ray reports were negative and that we need to allow time for the knee to heal.  If the knee is still giving her issues at her follow-up appointment we can address it at that time.  She feels that with the right hip that she is doing quite well and not having any real significant pain.  Patient is orthopedically stable and is on the medical service.  Discharge prescription sent to the patient's pharmacy as she states she would like to go home and definitely does not want to go to a facility.   Objective:   VITALS:   Vitals:   06/22/19 0330 06/22/19 0407  BP:  137/62  Pulse: 68 83  Resp:  19  Temp:  (!) 97.5 F (36.4 C)  SpO2:  97%   Right hip: Dorsiflexion/Plantar flexion intact Incision: dressing C/D/I No cellulitis present Compartment soft  Right knee: Ecchymosis noted about the anterior aspect of the right knee No instability noted about the right knee with varus or valgus stress testing. No deficits noted with quad tendon or patella tendon.   LABS Recent Labs    06/20/19 2113 06/21/19 0518 06/22/19 0449  HGB 12.6 11.9* 11.5*  HCT 39.9 37.9 37.0  WBC 7.6 9.7 10.1  PLT 139* 139* 118*    Recent Labs    06/20/19 2113 06/21/19 0518 06/22/19 0449  NA 142 140 136  K 4.1 4.7 4.4  BUN 19 18 15   CREATININE 1.08* 1.08* 1.01*  GLUCOSE 136* 192* 258*     Assessment/Plan: 1 Day  Post-Op Procedure(s) (LRB): TOTAL HIP ARTHROPLASTY ANTERIOR APPROACH (Right)   Advance diet Up with therapy Discharge home with home exercise program when ready   Ortho recommendations:  ASA 81 mg bid for 4 weeks for anticoagulation, unless other medically indicated  Tramadol and /or APAP for pain management (Tramamdol Rx sent to her pharmacy)  Robaxin for muscle spasms (Rx sent to her pharmacy)  MiraLax and Colace for constipation as needed  Iron 325 mg tid for 2-3 weeks   WBAT on the right  leg.  Dressing to remain in place until follow in clinic in 2 weeks.  Dressing is waterproof and may shower with it in place.  Follow up in 2 weeks at [facility]. Follow up with OLIN,Travon Crochet D in 2 weeks.  Contact information:  [facility] [address], [address] [place]   PAC  06/22/2019, 8:51 AM
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
## 2019-06-23 DIAGNOSIS — D696 Thrombocytopenia, unspecified: Secondary | ICD-10-CM

## 2019-06-23 DIAGNOSIS — Y92009 Unspecified place in unspecified non-institutional (private) residence as the place of occurrence of the external cause: Secondary | ICD-10-CM

## 2019-06-23 DIAGNOSIS — D62 Acute posthemorrhagic anemia: Secondary | ICD-10-CM

## 2019-06-23 DIAGNOSIS — W19XXXA Unspecified fall, initial encounter: Secondary | ICD-10-CM

## 2019-06-23 DIAGNOSIS — E039 Hypothyroidism, unspecified: Secondary | ICD-10-CM

## 2019-06-23 LAB — BASIC METABOLIC PANEL
Anion gap: 7 (ref 5–15)
BUN: 25 mg/dL — ABNORMAL HIGH (ref 8–23)
CO2: 25 mmol/L (ref 22–32)
Calcium: 8.5 mg/dL — ABNORMAL LOW (ref 8.9–10.3)
Chloride: 106 mmol/L (ref 98–111)
Creatinine, Ser: 1.15 mg/dL — ABNORMAL HIGH (ref 0.44–1.00)
GFR calc Af Amer: 53 mL/min — ABNORMAL LOW (ref >60)
GFR calc non Af Amer: 46 mL/min — ABNORMAL LOW (ref >60)
Glucose, Bld: 161 mg/dL — ABNORMAL HIGH (ref 70–99)
Potassium: 5 mmol/L (ref 3.5–5.1)
Sodium: 138 mmol/L (ref 135–145)

## 2019-06-23 LAB — CBC
HCT: 30.6 % — ABNORMAL LOW (ref 36.0–46.0)
Hemoglobin: 9.6 g/dL — ABNORMAL LOW (ref 12.0–15.0)
MCH: 29.9 pg (ref 26.0–34.0)
MCHC: 31.4 g/dL (ref 30.0–36.0)
MCV: 95.3 fL (ref 80.0–100.0)
Platelets: 127 10*3/uL — ABNORMAL LOW (ref 150–400)
RBC: 3.21 MIL/uL — ABNORMAL LOW (ref 3.87–5.11)
RDW: 12.9 % (ref 11.5–15.5)
WBC: 11.1 10*3/uL — ABNORMAL HIGH (ref 4.0–10.5)
nRBC: 0 % (ref 0.0–0.2)

## 2019-06-23 LAB — URINE CULTURE: Culture: >=100000 — AB

## 2019-06-23 LAB — VITAMIN D 25 HYDROXY (VIT D DEFICIENCY, FRACTURES): Vit D, 25-Hydroxy: 31.3 ng/mL (ref 30.0–100.0)

## 2019-06-23 MED ORDER — SENNA 8.6 MG PO TABS
1.0000 | ORAL_TABLET | Freq: Every day | ORAL | Status: DC
  Administered 2019-06-23: 8.6 mg via ORAL
  Filled 2019-06-23: qty 1

## 2019-06-23 MED ORDER — MORPHINE SULFATE (PF) 2 MG/ML IV SOLN: 2.0000 mg | INTRAVENOUS | Status: DC | PRN

## 2019-06-23 MED ORDER — POLYETHYLENE GLYCOL 3350 17 G PO PACK
17.0000 g | PACK | Freq: Two times a day (BID) | ORAL | Status: DC
  Administered 2019-06-23 – 2019-06-24 (×3): 17 g via ORAL
  Filled 2019-06-23 (×3): qty 1

## 2019-06-23 NOTE — Care Management Important Message (Signed)
Important Message  Patient Details IM Letter given to Velva Harman RN to present to the Patient Name: Jennifer Yang MRN: [id number] Date of Birth: [birth year]   Medicare Important Message Given:  Yes     Kerin Salen 06/23/2019, 11:13 AM

## 2019-06-23 NOTE — Plan of Care (Signed)
progressing 

## 2019-06-23 NOTE — Progress Notes (Signed)
Patient ID: Jennifer Yang, female   DOB: [birth year], 78 y.o.   MRN: [id number] Subjective: 2 Days Post-Op Procedure(s) (LRB): TOTAL HIP ARTHROPLASTY ANTERIOR APPROACH (Right)    Patient reports pain as moderate.  Some OOB activity yesterday  Objective:   VITALS:   Vitals:   06/22/19 2136 06/23/19 0533  BP: (!) 141/65 124/70  Pulse: 67 63  Resp: 17 17  Temp: 98.3 F (36.8 C) 98.2 F (36.8 C)  SpO2: 95% 98%    Neurovascular intact Incision: dressing C/D/I  LABS Recent Labs    06/21/19 0518 06/22/19 0449 06/23/19 0232  HGB 11.9* 11.5* 9.6*  HCT 37.9 37.0 30.6*  WBC 9.7 10.1 11.1*  PLT 139* 118* 127*    Recent Labs    06/21/19 0518 06/22/19 0449 06/23/19 0232  NA 140 136 138  K 4.7 4.4 5.0  BUN 18 15 25*  CREATININE 1.08* 1.01* 1.15*  GLUCOSE 192* 258* 161*    Recent Labs    06/20/19 2113  INR 0.9     Assessment/Plan: 2 Days Post-Op Procedure(s) (LRB): TOTAL HIP ARTHROPLASTY ANTERIOR APPROACH (Right)   Up with therapy  Needs more in patient therapy to improve safe mobility  Would plan for D/C tomorrow after in patient therapy today and tomorrow am Aspirin for DVT prophylaxis as well as increasing activity Pain meds RTC in 2 weeks WBAT RLE No restrictions

## 2019-06-23 NOTE — Plan of Care (Signed)
Plan of care reviewed and discussed with the patient and her daughter. 

## 2019-06-23 NOTE — Progress Notes (Signed)
PROGRESS NOTE  Jennifer Yang VQQ:[id number] DOB: [birth year] DOA: 06/20/2019 PCP: [name], MD  Brief History   78 year old woman PMH of breast cancer, right mastectomy, presented after a fall at home with resulting right hip pain.  Admitted for management of right femoral neck fracture.  A & P  Right femoral neck fracture status post mechanical fall at home.  Fall, initial encounter. 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  Likely discharge 8/7 home with home health. --Ortho recs  ASA 81 mg bid for 4 weeks for anticoagulation, unless other medically indicated  Tramadol and /or APAP for pain management (Tramamdol Rx sent to her pharmacy)  Robaxin for muscle spasms (Rx sent to her pharmacy)  MiraLax and Colace for constipation as needed  Iron 325 mg tid for 2-3 weeks   WBAT on the right  leg.  Dressing to remain in place until follow in clinic in 2 weeks.  Dressing is waterproof and may shower with it in place.  Follow up in 2 weeks at [facility].  Acute blood loss anemia, operative in nature --Trend hemoglobin.  No evidence of obvious bleeding.  Check CBC in a.m.  Thrombocytopenia, probably secondary to acute blood loss --Appears to be improving at this point.  Follow-up as an outpatient.  Essential hypertension --Stable.  Continue losartan and amlodipine  Osteoporosis --Continue Zometa in the outpatient setting  Hypothyroidism --Continue levothyroxine.  Asymptomatic bacteriuria  PMH right breast cancer in remission, status post mastectomy, chronic right arm lymphedema  DVT prophylaxis: SCDs Code Status: Full Family Communication: daughter at bedside Disposition Plan: home with HHPT, likely 8/7 if improved    Murray Hodgkins, MD  [facility] Direct contact: see [email or web address] (further directions at bottom of note if needed) 7PM-7AM contact night coverage as at bottom of note 06/23/2019, 1:39 PM  LOS: 3 days    Consultants  . Orthopedics   Procedures   Right total hip replacement through an anterior approach   utilizing DePuy THR system, component size 76mm pinnacle cup, a size 36+4 neutral   Altrex liner, a size 7 Hi Tri Lock stem with a 36+1.5  Articuleze metal head ball.   Antibiotics  .   Interval History/Subjective  Overall feels well.  Has soreness in the right hip.  Breathing okay.  Did well with physical therapy.  Planning on going home when discharged.  Objective   Vitals:  Vitals:   06/23/19 0932 06/23/19 1011  BP: (!) 127/58 129/63  Pulse: 60   Resp: 16   Temp: 97.6 F (36.4 C)   SpO2: 98%     Exam:  Constitutional:  . Appears calm and comfortable Respiratory:  . CTA bilaterally, no w/r/r.  . Respiratory effort normal.  Cardiovascular:  . RRR, no m/r/g . No significant LE extremity edema   Abdomen:  . Soft, nontender Musculoskeletal:  . RLE, LLE   . Moves both lower extremities to command Skin:  . Some ecchymosis over the right patella noted Psychiatric:  . Mental status o Mood, affect appropriate . judgment and insight appear intact     I have personally reviewed the following:   Today's Data  . Afebrile, vital signs stable . BUN slightly elevated 25, creatinine slightly elevated at 1.15. Marland Kitchen Hemoglobin slightly lower at 9.6.  Platelet count recovering, up to 127.  Lab Data  . SARS-CoV-2 negative  Micro Data  . Urine culture positive for E. coli  Imaging  . Chest x-ray no acute disease . Hip  film showed mildly displaced fracture right femoral neck  Cardiology Data  . EKG on admission sinus rhythm, no acute changes  Other Data  .   Scheduled Meds: . acetaminophen  1,000 mg Oral Q8H  . amLODipine  5 mg Oral Daily  . aspirin EC  325 mg Oral BID  . calcium-vitamin D  1 tablet Oral Q breakfast  . docusate sodium  100 mg Oral BID  . ferrous sulfate  325 mg Oral TID PC  . mupirocin ointment  1 application Nasal BID  . polyethylene glycol   17 g Oral BID  . rosuvastatin  5 mg Oral Daily  . senna  1 tablet Oral QHS   Continuous Infusions: . methocarbamol (ROBAXIN) IV      Principal Problem:   Right femoral fracture (HCC) Active Problems:   Breast cancer, right breast (HCC)   Benign essential HTN   Drug-induced osteoporosis   Hypothyroidism   Thrombocytopenia (HCC)   Acute blood loss anemia   Fall at home, initial encounter   LOS: 3 days   How to contact the [facility] Attending or Consulting provider [place] or covering provider during after hours [place], for this patient?  1. Check the care team in [facility] and look for a) attending/consulting [facility] provider listed and b) the [facility] team listed 2. Log into [email or web address] and use [place]'s universal password to access. If you do not have the password, please contact the hospital operator. 3. Locate the [facility] provider you are looking for under [facility] and page to a number that you can be directly reached. 4. If you still have difficulty reaching the provider, please page the [facility] (Director on Call) for the Hospitalists listed on amion for assistance.

## 2019-06-23 NOTE — Progress Notes (Signed)
Physical Therapy Treatment Patient Details Name: Jennifer Yang MRN: [id number] DOB: [birth year] Today's Date: 06/23/2019    History of Present Illness 78 yo female admitted with R femur fx. S/P R THA-direct anterior 8/4    PT Comments    Pt progressing, however feel will benefit from another day or so for PT to maximize independence   Follow Up Recommendations  Home health PT;Supervision/Assistance - 24 hour     Equipment Recommendations  Rolling walker with 5" wheels    Recommendations for Other Services       Precautions / Restrictions Precautions Precautions: Fall Restrictions Weight Bearing Restrictions: No Other Position/Activity Restrictions: WBAT    Mobility  Bed Mobility Overal bed mobility: Needs Assistance Bed Mobility: Supine to Sit     Supine to sit: Min assist     General bed mobility comments: Assist for R LE and trunk, Increased time. Cues for technique. Pt relied on trapeze for repositioning.  Transfers Overall transfer level: Needs assistance Equipment used: Rolling walker (2 wheeled) Transfers: Sit to/from Stand Sit to Stand: Min assist;Min guard            Ambulation/Gait Ambulation/Gait assistance: Min guard;Min assist Gait Distance (Feet): 45 Feet Assistive device: Rolling walker (2 wheeled) Gait Pattern/deviations: Step-to pattern;Decreased weight shift to right Gait velocity: decr    General Gait Details: verbal cues for sequence and RW safety, fatigues easily   Stairs             Wheelchair Mobility    Modified Rankin (Stroke Patients Only)       Balance           Standing balance support: Bilateral upper extremity supported Standing balance-Leahy Scale: Poor                              Cognition Arousal/Alertness: Awake/alert Behavior During Therapy: WFL for tasks assessed/performed Overall Cognitive Status: Within Functional Limits for tasks assessed                                         Exercises      General Comments        Pertinent Vitals/Pain Pain Assessment: 0-10 Pain Score: 5  Pain Location: R hip/thigh Pain Descriptors / Indicators: Aching;Sore Pain Intervention(s): Limited activity within patient's tolerance;Monitored during session;Patient requesting pain meds-RN notified;RN gave pain meds during session;Repositioned;Ice applied    Home Living                      Prior Function            PT Goals (current goals can now be found in the care plan section) Acute Rehab PT Goals Patient Stated Goal: less pain. regain independence/plof PT Goal Formulation: With patient Time For Goal Achievement: 07/06/19 Potential to Achieve Goals: Good Progress towards PT goals: Progressing toward goals    Frequency    7X/week      PT Plan Current plan remains appropriate    Co-evaluation              AM-PAC PT "6 Clicks" Mobility   Outcome Measure  Help needed turning from your back to your side while in a flat bed without using bedrails?: A Little Help needed moving from lying on your back to sitting on the side of a flat bed  without using bedrails?: A Little Help needed moving to and from a bed to a chair (including a wheelchair)?: A Little Help needed standing up from a chair using your arms (e.g., wheelchair or bedside chair)?: A Little Help needed to walk in hospital room?: A Little Help needed climbing 3-5 steps with a railing? : A Little 6 Click Score: 18    End of Session Equipment Utilized During Treatment: Gait belt Activity Tolerance: Patient tolerated treatment well Patient left: with call bell/phone within reach;in chair   PT Visit Diagnosis: Other abnormalities of gait and mobility (R26.89);Pain Pain - Right/Left: Right Pain - part of body: Leg     Time: 0901-0930 PT Time Calculation (min) (ACUTE ONLY): 29 min  Charges:  $Gait Training: 8-22 mins $Therapeutic Activity: 8-22 mins                      Kenyon Ana, PT  Pager: [phone] Acute Rehab Dept [facility]): [phone]   06/23/2019    [facility] 06/23/2019, 9:38 AM

## 2019-06-23 NOTE — Progress Notes (Signed)
   06/23/19 1100  PT Visit Information  Last PT Received On 06/23/19  Pt is motivated and progressing, however continues to fatigue easily; continue to feel pt will benefit from continued PT in acute setting to maximize independence   Assistance Needed +1  History of Present Illness 78 yo female admitted with R femur fx. S/P R THA-direct anterior 8/4  Subjective Data  Patient Stated Goal less pain. regain independence/plof  Precautions  Precautions Fall  Restrictions  Weight Bearing Restrictions No  Other Position/Activity Restrictions WBAT  Pain Assessment  Pain Location R hip/thigh  Pain Descriptors / Indicators Aching;Sore  Cognition  Arousal/Alertness Awake/alert  Behavior During Therapy WFL for tasks assessed/performed  Overall Cognitive Status Within Functional Limits for tasks assessed  Bed Mobility  Overal bed mobility Needs Assistance  Bed Mobility Sit to Supine  Sit to supine Min assist  General bed mobility comments Assist for R LE and trunk, Increased time. Cues for technique. Pt relied on trapeze for repositioning.  Transfers  Overall transfer level Needs assistance  Equipment used Rolling walker (2 wheeled)  Transfers Sit to/from Stand  Sit to Stand Min assist;Min guard  Ambulation/Gait  Ambulation/Gait assistance Min guard;Min assist  Gait Distance (Feet) 11 Feet (x2)  Assistive device Rolling walker (2 wheeled)  Gait Pattern/deviations Step-to pattern;Decreased weight shift to right  General Gait Details verbal cues for sequence and RW safety, fatigues easily  Gait velocity decr   Balance  Standing balance support Bilateral upper extremity supported  Standing balance-Leahy Scale Poor  Total Joint Exercises  Ankle Circles/Pumps AROM;Both;10 reps  Quad Sets AROM;Both;10 reps  Heel Slides AAROM;AROM;Right;10 reps  Hip ABduction/ADduction AAROM;Right;10 reps  PT - End of Session  Equipment Utilized During Treatment Gait belt  Activity Tolerance Patient  tolerated treatment well  Patient left with call bell/phone within reach;in chair   PT - Assessment/Plan  PT Plan Current plan remains appropriate  PT Visit Diagnosis Other abnormalities of gait and mobility (R26.89);Pain  Pain - Right/Left Right  Pain - part of body Leg  PT Frequency (ACUTE ONLY) 7X/week  Follow Up Recommendations Home health PT;Supervision/Assistance - 24 hour  PT equipment Rolling walker with 5" wheels  AM-PAC PT "6 Clicks" Mobility Outcome Measure (Version 2)  Help needed turning from your back to your side while in a flat bed without using bedrails? 3  Help needed moving from lying on your back to sitting on the side of a flat bed without using bedrails? 3  Help needed moving to and from a bed to a chair (including a wheelchair)? 3  Help needed standing up from a chair using your arms (e.g., wheelchair or bedside chair)? 3  Help needed to walk in hospital room? 3  Help needed climbing 3-5 steps with a railing?  3  6 Click Score 18  Consider Recommendation of Discharge To: Home with [facility]  PT Goal Progression  Progress towards PT goals Progressing toward goals  Acute Rehab PT Goals  PT Goal Formulation With patient  Time For Goal Achievement 07/06/19  Potential to Achieve Goals Good  PT Time Calculation  PT Start Time (ACUTE ONLY) 1035  PT Stop Time (ACUTE ONLY) 1100  PT Time Calculation (min) (ACUTE ONLY) 25 min  PT General Charges  $$ ACUTE PT VISIT 1 Visit  PT Treatments  $Gait Training 8-22 mins  $Therapeutic Exercise 8-22 mins  Kenyon Ana, PT  Pager: [phone] Acute Rehab Dept [facility]): [phone]   06/23/2019

## 2019-06-24 DIAGNOSIS — D62 Acute posthemorrhagic anemia: Secondary | ICD-10-CM

## 2019-06-24 LAB — BASIC METABOLIC PANEL
Anion gap: 7 (ref 5–15)
BUN: 23 mg/dL (ref 8–23)
CO2: 24 mmol/L (ref 22–32)
Calcium: 8.4 mg/dL — ABNORMAL LOW (ref 8.9–10.3)
Chloride: 109 mmol/L (ref 98–111)
Creatinine, Ser: 1.12 mg/dL — ABNORMAL HIGH (ref 0.44–1.00)
GFR calc Af Amer: 55 mL/min — ABNORMAL LOW (ref >60)
GFR calc non Af Amer: 47 mL/min — ABNORMAL LOW (ref >60)
Glucose, Bld: 116 mg/dL — ABNORMAL HIGH (ref 70–99)
Potassium: 4.2 mmol/L (ref 3.5–5.1)
Sodium: 140 mmol/L (ref 135–145)

## 2019-06-24 LAB — CBC
HCT: 28 % — ABNORMAL LOW (ref 36.0–46.0)
Hemoglobin: 8.8 g/dL — ABNORMAL LOW (ref 12.0–15.0)
MCH: 30 pg (ref 26.0–34.0)
MCHC: 31.4 g/dL (ref 30.0–36.0)
MCV: 95.6 fL (ref 80.0–100.0)
Platelets: 131 10*3/uL — ABNORMAL LOW (ref 150–400)
RBC: 2.93 MIL/uL — ABNORMAL LOW (ref 3.87–5.11)
RDW: 13.2 % (ref 11.5–15.5)
WBC: 8.7 10*3/uL (ref 4.0–10.5)
nRBC: 0 % (ref 0.0–0.2)

## 2019-06-24 MED ORDER — FLEET ENEMA 7-19 GM/118ML RE ENEM
1.0000 | ENEMA | Freq: Once | RECTAL | Status: AC
  Administered 2019-06-24: 1 via RECTAL
  Filled 2019-06-24: qty 1

## 2019-06-24 MED ORDER — MAGNESIUM CITRATE PO SOLN
0.5000 | Freq: Once | ORAL | Status: AC
  Administered 2019-06-24: 0.5 via ORAL

## 2019-06-24 NOTE — Progress Notes (Addendum)
Subjective: 3 Days Post-Op Procedure(s) (LRB): TOTAL HIP ARTHROPLASTY ANTERIOR APPROACH (Right) Patient reports pain as mild.   Patient seen in rounds with Dr. Alvan Dame. Patient is well, and has had no acute complaints or problems other than discomfort in the right hip. She is concerned that she has not had a bowel movement since her admission, but reports passing gas. Voiding without difficulty. Patient states she would like to go home today.  We will continue therapy today.   Objective: Vital signs in last 24 hours: Temp:  [97.6 F (36.4 C)-98.4 F (36.9 C)] 98.4 F (36.9 C) (08/07 0511) Pulse Rate:  [57-63] 57 (08/07 0511) Resp:  [14-16] 14 (08/07 0511) BP: (127-145)/(58-65) 133/60 (08/07 0511) SpO2:  [98 %-99 %] 99 % (08/07 0511)  Intake/Output from previous day:  Intake/Output Summary (Last 24 hours) at 06/24/2019 0744 Last data filed at 06/24/2019 0511 Gross per 24 hour  Intake 600 ml  Output 300 ml  Net 300 ml     Intake/Output this shift: No intake/output data recorded.  Labs: Recent Labs    06/22/19 0449 06/23/19 0232 06/24/19 0236  HGB 11.5* 9.6* 8.8*   Recent Labs    06/23/19 0232 06/24/19 0236  WBC 11.1* 8.7  RBC 3.21* 2.93*  HCT 30.6* 28.0*  PLT 127* 131*   Recent Labs    06/23/19 0232 06/24/19 0236  NA 138 140  K 5.0 4.2  CL 106 109  CO2 25 24  BUN 25* 23  CREATININE 1.15* 1.12*  GLUCOSE 161* 116*  CALCIUM 8.5* 8.4*   No results for input(s): LABPT, INR in the last 72 hours.  Exam: General - Patient is Alert and Oriented Extremity - Neurologically intact Sensation intact distally Intact pulses distally Dorsiflexion/Plantar flexion intact Dressing - dressing C/D/I Motor Function - intact, moving foot and toes well on exam.   Past Medical History:  Diagnosis Date  . Allergy    shell fish  . Anxiety    takes xanax on a rare occasion  . Arthritis    knees  . Benign essential HTN 11/24/2011  . Bladder infection 03/18/15   completed  antibotics   . Breast cancer, right breast ([place]) 02/16/2008  . Breast cancer, stage 1 ([place]) 07/16/1990  . Cancer [facility])    right Chemo/radiation '01/radical '09  . Cataract   . Contact lens/glasses fitting   . 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 Family history of breast cancer    mother  . GERD (gastroesophageal reflux disease)   . Hyperlipidemia   . Hypertension   . Lymphedema of arm 11/24/2011   right arm  . MVP (mitral valve prolapse)   . PONV (postoperative nausea and vomiting)    has vertigo also-would like scop patch  . Syncope 2019   after colonoscopy states "dehydrated"  . Thyroid disease   . Vertigo     Assessment/Plan: 3 Days Post-Op Procedure(s) (LRB): TOTAL HIP ARTHROPLASTY ANTERIOR APPROACH (Right) Principal Problem:   Right femoral fracture (HCC) Active Problems:   Breast cancer, right breast (HCC)   Benign essential HTN   Drug-induced osteoporosis   Hypothyroidism   Thrombocytopenia (HCC)   Acute blood loss anemia   Fall at home, initial encounter  Estimated body mass index is 28.5 kg/m as calculated from the following:   Height as of this encounter: 5\' 9"  (1.753 m).   Weight as of this encounter: 87.5 kg. Advance diet Up with therapy D/C IV fluids  DVT Prophylaxis - Aspirin Weight bearing as tolerated.  D/C O2 and pulse ox and try on room air.  Plan is to go Home after hospital stay. Possible discharge today from an Orthopedic standpoint as long as she is able to perform stairs and is meeting goals with therapy. Discharge per Medicine team. Patient is concerned about not having had a bowel movement to this point, and we will try mag citrate today. Prescriptions sent to the pharmacy. Follow up in the office in 2 weeks with Dr. Alvan Dame.   Griffith Citron, PA-C Orthopedic Surgery 06/24/2019, 7:44 AM

## 2019-06-24 NOTE — TOC Transition Note (Signed)
Transition of Care [facility]) - CM/SW Discharge Note   Patient Details  Name: ANIESA BOBACK MRN: [id number] Date of Birth: January 26, 1941  Transition of Care [facility]) CM/SW Contact:  Lia Hopping, [place] Phone Number: 06/24/2019, 3:41 PM   Clinical Narrative:    Orders placed for HHPT. Patient and daughter chose [facility].CSW gave instruction to the patient.  RW, 3 IN1 and shower stool ordered through [employer] and delivered to the patient room   Final next level of care: [place] Barriers to Discharge: No Barriers Identified   Patient Goals and CMS Choice Patient states their goals for this hospitalization and ongoing recovery are:: Get better CMS Medicare.gov Compare Post Acute Care list provided to:: Patient Choice offered to / list presented to : Patient  Discharge Placement  Home                    Discharge Plan and Services                DME Arranged: 3-N-1, Walker rolling, Tub bench DME Agency: [employer] Date DME Agency Contacted: 06/24/19 Time DME Agency Contacted: 1500 Representative spoke with at DME Agency: [place]: PT [place]: [place] ([place]) Date [place]: 06/24/19 Time [place]: 1500 Representative spoke with at [place]: [place] ([place]) Interventions     Readmission Risk Interventions No flowsheet data found.

## 2019-06-24 NOTE — Discharge Summary (Signed)
Physician Discharge Summary  Jennifer Yang XIP:[id number] DOB: 03/05/1941 DOA: 06/20/2019  PCP: [name], MD  Admit date: 06/20/2019 Discharge date: 06/24/2019  Recommendations for Outpatient Follow-up:    Follow-up Information    Paralee Cancel, MD. Schedule an appointment as soon as possible for a visit in 2 weeks.   Specialty: Orthopedic Surgery Contact information: [address] [place] [address] [phone]            Discharge Diagnoses: Principal diagnosis is #1 1. Right femoral neck fracture status post mechanical fall at home. Fall, initial encounter. 2. Acute blood loss anemia, operative in nature 3. Thrombocytopenia, probably secondary to acute blood loss 4. Essential hypertension 5. Osteoporosis 6. Hypothyroidism  Discharge Condition: improved Disposition: home with HHPT  Diet recommendation: Heart healthy  Filed Weights   06/20/19 2058  Weight: 87.5 kg    History of present illness:  78 year old woman PMH of breast cancer, right mastectomy, presented after a fall at home with resulting right hip pain.  Admitted for management of right femoral neck fracture.  Hospital Course:  Patient underwent definitive management per orthopedics with total hip replacement.  Postoperative course was uncomplicated.  Constipation was noted but relieved prior to discharge.  She did well with therapy and plan is home health physical therapy discussed with daughter at bedside.  Right femoral neck fracture status post mechanical fall at home.  Fall, initial encounter. 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  Likely discharge 8/7 home with home health. --Ortho recs  [id number]  bid for 4 weeks for anticoagulation, unless other medically indicated  Tramadoland /or APAPfor pain management(Tramamdol Rx sent to her pharmacy)  Robaxin for muscle spasms (Rx sent to her pharmacy)  MiraLax and Colace for constipationas needed  Iron  325 mg tid for 2-3 weeks   WBATon the rightleg.  Dressing to remain in place until follow in clinic in 2 weeks.  Dressing is waterproof and may shower with it in place.  Follow up in 2 weeks at [facility].  Acute blood loss anemia, operative in nature --Hemoglobin stabilizing.  Expect spontaneous recovery in the outpatient setting.  Thrombocytopenia, probably secondary to acute blood loss --Appears to be improving at this point.  Follow-up as an outpatient.  Essential hypertension --Stable.  Continue losartan and amlodipine  Osteoporosis --Continue Zometa in the outpatient setting  Hypothyroidism --Continue levothyroxine.  Asymptomatic bacteriuria  PMH right breast cancer in remission, status post mastectomy, chronic right arm lymphedema  Today's assessment: S: Feels well, no complaints other than constipation  O: Vitals:  Vitals:   06/24/19 0511 06/24/19 0820  BP: 133/60 (!) 117/57  Pulse: (!) 57   Resp: 14   Temp: 98.4 F (36.9 C)   SpO2: 99%     Constitutional:  . Appears calm and comfortable Respiratory:  . CTA bilaterally, no w/r/r.  . Respiratory effort normal.  Cardiovascular:  . RRR, no m/r/g . No LE extremity edema   Psychiatric:  . Mental status o Mood, affect appropriate  BMP unremarkable.  Platelets recovering, 131.  Hemoglobin stable at 8.8.  WBC has normalized.  Discharge Instructions  Discharge Instructions    Call MD / Call 911   Complete by: As directed    If you experience chest pain or shortness of breath, CALL 911 and be transported to the hospital emergency room.  If you develope a fever above 101 F, pus (white drainage) or increased drainage or redness at the wound, or calf pain, call  your surgeon's office.   Change dressing   Complete by: As directed    Maintain surgical dressing until follow up in the clinic. If the edges start to pull up, may reinforce with tape. If the dressing is no longer working, may  remove and cover with gauze and tape, but must keep the area dry and clean.  Call with any questions or concerns.   Constipation Prevention   Complete by: As directed    Drink plenty of fluids.  Prune juice may be helpful.  You may use a stool softener, such as Colace (over the counter) 100 mg twice a day.  Use MiraLax (over the counter) for constipation as needed.   Diet - low sodium heart healthy   Complete by: As directed    Discharge instructions   Complete by: As directed    Maintain surgical dressing until follow up in the clinic. If the edges start to pull up, may reinforce with tape. If the dressing is no longer working, may remove and cover with gauze and tape, but must keep the area dry and clean.  Follow up in 2 weeks at [facility]. Call with any questions or concerns.   Increase activity slowly as tolerated   Complete by: As directed    Weight bearing as tolerated with assist device (walker, cane, etc) as directed, use it as long as suggested by your surgeon or therapist, typically at least 4-6 weeks.   TED hose   Complete by: As directed    Use stockings (TED hose) for 2 weeks on both leg(s).  You may remove them at night for sleeping.   Weight bearing as tolerated   Complete by: As directed    Laterality: right   Extremity: Lower     Allergies as of 06/24/2019      Reactions   Codeine Sulfate Swelling   Fish-derived Products Other (See Comments)   Cellulitis    Rocephin [ceftriaxone Sodium] Other (See Comments)   Low blood count   Sulfa Antibiotics Other (See Comments)   Mouth and lip sores      Medication List    STOP taking these medications   aspirin 81 MG EC tablet Replaced by: aspirin 81 MG chewable tablet     TAKE these medications   acetaminophen 500 MG tablet Commonly known as: TYLENOL Take 2 tablets (1,000 mg total) by mouth every 8 (eight) hours.   ALPRAZolam 0.5 MG tablet Commonly known as: XANAX Take 0.5 mg by mouth daily as needed  for anxiety.   amLODipine 5 MG tablet Commonly known as: NORVASC Take 5 mg by mouth daily.   aspirin 81 MG chewable tablet Commonly known as: Aspirin Childrens Chew 1 tablet (81 mg total) by mouth 2 (two) times daily. Take for 4 weeks, then resume regular dose. Replaces: aspirin 81 MG EC tablet   Caltrate 600+D Plus Minerals 600-800 MG-UNIT Chew Chew 1 tablet by mouth daily.   cetirizine 10 MG tablet Commonly known as: ZYRTEC Take 1 tablet (10 mg total) by mouth daily. 1 po q day prn allergies   Cozaar 100 MG tablet Generic drug: losartan Take 100 mg by mouth daily.   docusate sodium 100 MG capsule Commonly known as: Colace Take 1 capsule (100 mg total) by mouth 2 (two) times daily.   ferrous sulfate 325 (65 FE) MG tablet Commonly known as: FerrouSul Take 1 tablet (325 mg total) by mouth 3 (three) times daily with meals for 14 days.   fluticasone  50 MCG/ACT nasal spray Commonly known as: FLONASE Place 1-2 sprays into both nostrils daily as needed for allergies or rhinitis.   meclizine 25 MG tablet Commonly known as: ANTIVERT Take 25 mg by mouth 3 (three) times daily as needed for dizziness.   methocarbamol 500 MG tablet Commonly known as: Robaxin Take 1 tablet (500 mg total) by mouth every 6 (six) hours as needed for muscle spasms.   ondansetron 4 MG tablet Commonly known as: ZOFRAN Take 1 tablet (4 mg total) by mouth every 8 (eight) hours as needed for nausea or vomiting.   polyethylene glycol 17 g packet Commonly known as: MIRALAX / GLYCOLAX Take 17 g by mouth 2 (two) times daily.   rosuvastatin 5 MG tablet Commonly known as: CRESTOR Take 5 mg by mouth daily.   traMADol 50 MG tablet Commonly known as: Ultram Take 1-2 tablets (50-100 mg total) by mouth every 6 (six) hours as needed.   ZOMETA IV Inject 1 each into the skin See admin instructions. One a year            Discharge Care Instructions  (From admission, onward)         Start     Ordered    06/24/19 0000  Change dressing    Comments: Maintain surgical dressing until follow up in the clinic. If the edges start to pull up, may reinforce with tape. If the dressing is no longer working, may remove and cover with gauze and tape, but must keep the area dry and clean.  Call with any questions or concerns.   06/24/19 0752   06/22/19 0000  Weight bearing as tolerated    Question Answer Comment  Laterality right   Extremity Lower      06/22/19 0915         Allergies  Allergen Reactions  . Codeine Sulfate Swelling  . Fish-Derived Products Other (See Comments)    Cellulitis   . Rocephin [Ceftriaxone Sodium] Other (See Comments)    Low blood count  . Sulfa Antibiotics Other (See Comments)    Mouth and lip sores    The results of significant diagnostics from this hospitalization (including imaging, microbiology, ancillary and laboratory) are listed below for reference.    Significant Diagnostic Studies: Dg Chest 1 View  Result Date: 06/20/2019 CLINICAL DATA:  Fall EXAM: CHEST  1 VIEW COMPARISON:  01/17/2016 FINDINGS: Mild cardiomegaly with shallow lung inflation. No focal airspace consolidation or pulmonary edema. No pleural effusion. Right axillary surgical clips. IMPRESSION: Mild cardiomegaly without acute airspace disease. Electronically Signed   By: Ulyses Jarred M.D.   On: 06/20/2019 21:43   Pelvis Portable  Result Date: 06/21/2019 CLINICAL DATA:  Right total hip replacement EXAM: PORTABLE PELVIS 1-2 VIEWS COMPARISON:  Operative fluoroscopy June 21, 2019 FINDINGS: Redemonstration of a total right hip arthroplasty in expected alignment. No acute postsurgical complication is identified. Mild degenerative changes in the normally position of left hip. Additional degenerative features in the SI joints bilaterally. The soft tissues demonstrate expected postsurgical change including soft tissue gas. IMPRESSION: Right total hip arthroplasty without evidence of acute postoperative  complication. Electronically Signed   By: Lovena Le M.D.   On: 06/21/2019 20:56   Dg Knee Complete 4 Views Right  Result Date: 06/20/2019 CLINICAL DATA:  Fall.  Right leg pain EXAM: RIGHT KNEE - COMPLETE 4+ VIEW COMPARISON:  None. FINDINGS: Degenerative changes in the right knee with joint space narrowing and spurring in all 3 compartments. Chondrocalcinosis. Small joint  effusion. No acute bony abnormality. Specifically, no fracture, subluxation, or dislocation. IMPRESSION: Mild-to-moderate degenerative changes. Small joint effusion. No acute bony abnormality. Electronically Signed   By: Rolm Baptise M.D.   On: 06/20/2019 23:43   Dg C-arm 1-60 Min-no Report  Result Date: 06/21/2019 Fluoroscopy was utilized by the requesting physician.  No radiographic interpretation.   Dg Hip Operative Unilat W Or W/o Pelvis Right  Result Date: 06/21/2019 CLINICAL DATA:  Right hip replacement EXAM: OPERATIVE RIGHT HIP WITH PELVIS COMPARISON:  None. FLUOROSCOPY TIME:  Radiation Exposure Index (as provided by the fluoroscopic device): 1.86 mGy If the device does not provide the exposure index: Fluoroscopy Time:  12 seconds Number of Acquired Images:  2 FINDINGS: Right hip prosthesis is noted. No acute bony or soft tissue abnormality is seen. IMPRESSION: Status post right hip replacement. Electronically Signed   By: Inez Catalina M.D.   On: 06/21/2019 20:06   Dg Hip Unilat W Or Wo Pelvis 2-3 Views Right  Result Date: 06/20/2019 CLINICAL DATA:  Fall down stairs EXAM: DG HIP (WITH OR WITHOUT PELVIS) 2-3V RIGHT COMPARISON:  None. FINDINGS: There is a mildly superiorly displaced fracture of the right femoral neck. The femoral head remains situated in the acetabulum. No other pelvic fracture. IMPRESSION: Mildly displaced fracture of the right femoral neck. Electronically Signed   By: Ulyses Jarred M.D.   On: 06/20/2019 21:41    Microbiology: Recent Results (from the past 240 hour(s))  SARS Coronavirus 2 [facility] order,  Performed in [facility] Main hospital lab) Nasopharyngeal Nasopharyngeal Swab     Status: None   Collection Time: 06/20/19 10:44 PM   Specimen: Nasopharyngeal Swab  Result Value Ref Range Status   SARS Coronavirus 2 NEGATIVE NEGATIVE Final    Comment: (NOTE) If result is NEGATIVE SARS-CoV-2 target nucleic acids are NOT DETECTED. The SARS-CoV-2 RNA is generally detectable in upper and lower  respiratory specimens during the acute phase of infection. The lowest  concentration of SARS-CoV-2 viral copies this assay can detect is 250  copies / mL. A negative result does not preclude SARS-CoV-2 infection  and should not be used as the sole basis for treatment or other  patient management decisions.  A negative result may occur with  improper specimen collection / handling, submission of specimen other  than nasopharyngeal swab, presence of viral mutation(s) within the  areas targeted by this assay, and inadequate number of viral copies  (<250 copies / mL). A negative result must be combined with clinical  observations, patient history, and epidemiological information. If result is POSITIVE SARS-CoV-2 target nucleic acids are DETECTED. The SARS-CoV-2 RNA is generally detectable in upper and lower  respiratory specimens dur ing the acute phase of infection.  Positive  results are indicative of active infection with SARS-CoV-2.  Clinical  correlation with patient history and other diagnostic information is  necessary to determine patient infection status.  Positive results do  not rule out bacterial infection or co-infection with other viruses. If result is PRESUMPTIVE POSTIVE SARS-CoV-2 nucleic acids MAY BE PRESENT.   A presumptive positive result was obtained on the submitted specimen  and confirmed on repeat testing.  While 2019 novel coronavirus  (SARS-CoV-2) nucleic acids may be present in the submitted sample  additional confirmatory testing may be necessary for epidemiological  and / or  clinical management purposes  to differentiate between  SARS-CoV-2 and other Sarbecovirus currently known to infect humans.  If clinically indicated additional testing with an alternate test  methodology [phone])  is advised. The SARS-CoV-2 RNA is generally  detectable in upper and lower respiratory sp ecimens during the acute  phase of infection. The expected result is Negative. Fact Sheet for Patients:  StrictlyIdeas.no Fact Sheet for Healthcare Providers: BankingDealers.co.za This test is not yet approved or cleared by the [place] FDA and has been authorized for detection and/or diagnosis of SARS-CoV-2 by FDA under an Emergency Use Authorization (EUA).  This EUA will remain in effect (meaning this test can be used) for the duration of the COVID-19 declaration under Section 564(b)(1) of the Act, 21 U.S.C. section 360bbb-3(b)(1), unless the authorization is terminated or revoked sooner. Performed at [facility], [place] [address], [place], [place] [address]   Surgical PCR screen     Status: None   Collection Time: 06/21/19  3:56 PM   Specimen: Nasal Mucosa; Nasal Swab  Result Value Ref Range Status   MRSA, PCR NEGATIVE NEGATIVE Final   Staphylococcus aureus NEGATIVE NEGATIVE Final    Comment: (NOTE) The Xpert SA Assay (FDA approved for NASAL specimens in patients 69 years of age and older), is one component of a comprehensive surveillance program. It is not intended to diagnose infection nor to guide or monitor treatment. Performed at [facility], [place] [address] [place], [place] [address]   Urine Culture     Status: Abnormal   Collection Time: 06/21/19  7:30 PM   Specimen: Urine, Catheterized  Result Value Ref Range Status   Specimen Description   Final    URINE, CATHETERIZED Performed at [place] [address], [place], [place] [address]    Special Requests    Final    NONE Performed at [facility], [place] [address] [place], [place] [address]    Culture >=100,000 COLONIES/mL ESCHERICHIA COLI (A)  Final   Report Status 06/23/2019 FINAL  Final   Organism ID, Bacteria ESCHERICHIA COLI (A)  Final      Susceptibility   Escherichia coli - MIC*    AMPICILLIN 16 INTERMEDIATE Intermediate     CEFAZOLIN <=4 SENSITIVE Sensitive     CEFTRIAXONE <=1 SENSITIVE Sensitive     CIPROFLOXACIN >=4 RESISTANT Resistant     GENTAMICIN <=1 SENSITIVE Sensitive     IMIPENEM <=0.25 SENSITIVE Sensitive     NITROFURANTOIN <=16 SENSITIVE Sensitive     TRIMETH/SULFA >=320 RESISTANT Resistant     AMPICILLIN/SULBACTAM 4 SENSITIVE Sensitive     PIP/TAZO <=4 SENSITIVE Sensitive     Extended ESBL NEGATIVE Sensitive     * >=100,000 COLONIES/mL ESCHERICHIA COLI     Labs: Basic Metabolic Panel: Recent Labs  Lab 06/20/19 2113 06/21/19 0518 06/22/19 0449 06/23/19 0232 06/24/19 0236  NA 142 140 136 138 140  K 4.1 4.7 4.4 5.0 4.2  CL 108 106 105 106 109  CO2 24 26 19* 25 24  GLUCOSE 136* 192* 258* 161* 116*  BUN 19 18 15  25* 23  CREATININE 1.08* 1.08* 1.01* 1.15* 1.12*  CALCIUM 8.9 8.6* 8.3* 8.5* 8.4*   Liver Function Tests: Recent Labs  Lab 06/20/19 2113 06/21/19 0518  AST 26 26  ALT 18 19  ALKPHOS 49 44  BILITOT 1.1 1.1  PROT 7.1 6.7  ALBUMIN 4.1 3.8   CBC: Recent Labs  Lab 06/20/19 2113 06/21/19 0518 06/22/19 0449 06/23/19 0232 06/24/19 0236  WBC 7.6 9.7 10.1 11.1* 8.7  NEUTROABS 5.8  --   --   --   --   HGB 12.6 11.9* 11.5*  9.6* 8.8*  HCT 39.9 37.9 37.0 30.6* 28.0*  MCV 94.8 94.8 95.9 95.3 95.6  PLT 139* 139* 118* 127* 131*    Principal Problem:   Right femoral fracture (HCC) Active Problems:   Breast cancer, right breast (HCC)   Benign essential HTN   Drug-induced osteoporosis   Hypothyroidism   Thrombocytopenia (HCC)   Acute blood loss anemia   Fall at home, initial encounter   Time coordinating discharge: 35  minutes  Signed:  Murray Hodgkins, MD  [facility]  06/24/2019, 12:57 PM

## 2019-06-24 NOTE — Progress Notes (Signed)
Physical Therapy Treatment Patient Details Name: Jennifer Yang MRN: [id number] DOB: 1941/09/05 Today's Date: 06/24/2019    History of Present Illness 78 yo female admitted with R femur fx. S/P R THA-direct anterior 8/4    PT Comments    Pt with marked improvement in activity tolerance and decreased assist needed for all mobility tasks.  Pt reviewed stairs and advanced therex program.  Follow Up Recommendations  Home health PT;Supervision/Assistance - 24 hour     Equipment Recommendations  Rolling walker with 5" wheels    Recommendations for Other Services       Precautions / Restrictions Precautions Precautions: Fall Restrictions Weight Bearing Restrictions: No Other Position/Activity Restrictions: WBAT    Mobility  Bed Mobility Overal bed mobility: Needs Assistance Bed Mobility: Supine to Sit     Supine to sit: Min assist     General bed mobility comments: cues for sequence and use of L LE to self assist.  Min assist only for management of R LE  Transfers Overall transfer level: Needs assistance Equipment used: Rolling walker (2 wheeled) Transfers: Sit to/from Stand Sit to Stand: Min guard;Supervision         General transfer comment: cues for use of UEs to self assist and for LE management  Ambulation/Gait Ambulation/Gait assistance: Min guard;Supervision Gait Distance (Feet): 100 Feet Assistive device: Rolling walker (2 wheeled) Gait Pattern/deviations: Step-to pattern;Decreased weight shift to right Gait velocity: decr    General Gait Details: min cues for sequence, posture and position from [employer] Stairs: Yes Stairs assistance: Min assist Stair Management: No rails;Step to pattern;Forwards;With walker Number of Stairs: 4 General stair comments: 2 steps twice.  RW placed on top step and pt utilizing porch post and RW on ascent ;  RW used on descent.  Cues for sequence and foot/RW placement   Wheelchair Mobility    Modified Rankin (Stroke  Patients Only)       Balance Overall balance assessment: Needs assistance Sitting-balance support: No upper extremity supported;Feet supported Sitting balance-Leahy Scale: Good     Standing balance support: Bilateral upper extremity supported Standing balance-Leahy Scale: Fair                              Cognition Arousal/Alertness: Awake/alert Behavior During Therapy: WFL for tasks assessed/performed Overall Cognitive Status: Within Functional Limits for tasks assessed                                        Exercises Total Joint Exercises Ankle Circles/Pumps: AROM;Both;10 reps Quad Sets: AROM;Both;10 reps Heel Slides: AAROM;AROM;Right;20 reps;Supine Hip ABduction/ADduction: AAROM;Right;15 reps;Supine    General Comments        Pertinent Vitals/Pain Pain Assessment: 0-10 Pain Score: 4  Pain Location: R hip/thigh Pain Descriptors / Indicators: Aching;Sore Pain Intervention(s): Limited activity within patient's tolerance;Monitored during session;Premedicated before session;Ice applied    Home Living                      Prior Function            PT Goals (current goals can now be found in the care plan section) Acute Rehab PT Goals Patient Stated Goal: less pain. regain independence/plof PT Goal Formulation: With patient Time For Goal Achievement: 07/06/19 Potential to Achieve Goals: Good Progress towards PT goals: Progressing toward goals  Frequency    7X/week      PT Plan Current plan remains appropriate    Co-evaluation              AM-PAC PT "6 Clicks" Mobility   Outcome Measure  Help needed turning from your back to your side while in a flat bed without using bedrails?: A Little Help needed moving from lying on your back to sitting on the side of a flat bed without using bedrails?: A Little Help needed moving to and from a bed to a chair (including a wheelchair)?: A Little Help needed standing up  from a chair using your arms (e.g., wheelchair or bedside chair)?: A Little Help needed to walk in hospital room?: A Little Help needed climbing 3-5 steps with a railing? : A Little 6 Click Score: 18    End of Session Equipment Utilized During Treatment: Gait belt Activity Tolerance: Patient tolerated treatment well Patient left: with call bell/phone within reach;in chair Nurse Communication: Mobility status PT Visit Diagnosis: Other abnormalities of gait and mobility (R26.89);Pain Pain - Right/Left: Right Pain - part of body: Leg     Time: 9528-4132 PT Time Calculation (min) (ACUTE ONLY): 37 min  Charges:  $Gait Training: 8-22 mins $Therapeutic Exercise: 8-22 mins                     Debe Coder PT Acute Rehabilitation Services Pager [phone] Office [phone]    Jennifer Yang 06/24/2019, 1:01 PM

## 2019-06-24 NOTE — Progress Notes (Signed)
Physical Therapy Treatment Patient Details Name: Jennifer Yang MRN: [id number] DOB: [birth year] Today's Date: 06/24/2019    History of Present Illness 78 yo female admitted with R femur fx. 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  Pt reviewed car transfers and home therex program with written instruction provided.  Follow Up Recommendations  Home health PT;Supervision/Assistance - 24 hour     Equipment Recommendations  Rolling walker with 5" wheels    Recommendations for Other Services       Precautions / Restrictions Precautions Precautions: Fall Restrictions Weight Bearing Restrictions: No Other Position/Activity Restrictions: WBAT    Mobility  Bed Mobility Overal bed mobility: Needs Assistance Bed Mobility: Supine to Sit     Supine to sit: Min assist     General bed mobility comments: cues for sequence and use of L LE to self assist.  Min assist only for management of R LE  Transfers Overall transfer level: Needs assistance Equipment used: Rolling walker (2 wheeled) Transfers: Sit to/from Stand Sit to Stand: Min guard;Supervision         General transfer comment: cues for use of UEs to self assist and for LE management  Ambulation/Gait Ambulation/Gait assistance: Min guard;Supervision Gait Distance (Feet): 100 Feet Assistive device: Rolling walker (2 wheeled) Gait Pattern/deviations: Step-to pattern;Decreased weight shift to right Gait velocity: decr    General Gait Details: min cues for sequence, posture and position from [employer] Stairs: Yes Stairs assistance: Min assist Stair Management: No rails;Step to pattern;Forwards;With walker Number of Stairs: 4 General stair comments: Pt states she feels comfortable with stairs   Wheelchair Mobility    Modified Rankin (Stroke Patients Only)       Balance Overall balance assessment: Needs  assistance Sitting-balance support: No upper extremity supported;Feet supported Sitting balance-Leahy Scale: Good     Standing balance support: Bilateral upper extremity supported Standing balance-Leahy Scale: Fair                              Cognition Arousal/Alertness: Awake/alert Behavior During Therapy: WFL for tasks assessed/performed Overall Cognitive Status: Within Functional Limits for tasks assessed                                        Exercises Total Joint Exercises Ankle Circles/Pumps: AROM;Both;10 reps Quad Sets: AROM;Both;10 reps Heel Slides: AAROM;AROM;Right;20 reps;Supine Hip ABduction/ADduction: AAROM;Right;15 reps;Supine Long Arc Quad: AROM;Right;10 reps;Seated    General Comments        Pertinent Vitals/Pain Pain Assessment: 0-10 Pain Score: 4  Pain Location: R hip/thigh Pain Descriptors / Indicators: Aching;Sore Pain Intervention(s): Limited activity within patient's tolerance;Monitored during session;Premedicated before session;Ice applied    Home Living                      Prior Function            PT Goals (current goals can now be found in the care plan section) Acute Rehab PT Goals Patient Stated Goal: less pain. regain independence/plof PT Goal Formulation: With patient Time For Goal Achievement: 07/06/19 Potential to Achieve Goals: Good Progress towards PT goals: Progressing toward goals    Frequency    7X/week      PT Plan  Current plan remains appropriate    Co-evaluation              AM-PAC PT "6 Clicks" Mobility   Outcome Measure  Help needed turning from your back to your side while in a flat bed without using bedrails?: A Little Help needed moving from lying on your back to sitting on the side of a flat bed without using bedrails?: A Little Help needed moving to and from a bed to a chair (including a wheelchair)?: A Little Help needed standing up from a chair using your  arms (e.g., wheelchair or bedside chair)?: A Little Help needed to walk in hospital room?: A Little Help needed climbing 3-5 steps with a railing? : A Little 6 Click Score: 18    End of Session Equipment Utilized During Treatment: Gait belt Activity Tolerance: Patient tolerated treatment well Patient left: with call bell/phone within reach;in chair Nurse Communication: Mobility status PT Visit Diagnosis: Other abnormalities of gait and mobility (R26.89);Pain Pain - Right/Left: Right Pain - part of body: Leg     Time: 2334-3568 PT Time Calculation (min) (ACUTE ONLY): 22 min  Charges:  $Gait Training: 8-22 mins $Therapeutic Exercise: 8-22 mins $Therapeutic Activity: 8-22 mins                     Debe Coder PT Acute Rehabilitation Services Pager [phone] Office [phone]    Tomeika Weinmann 06/24/2019, 1:04 PM

## 2019-06-25 DIAGNOSIS — Z9181 History of falling: Secondary | ICD-10-CM | POA: Diagnosis not present

## 2019-06-25 DIAGNOSIS — Z96641 Presence of right artificial hip joint: Secondary | ICD-10-CM | POA: Diagnosis not present

## 2019-06-25 DIAGNOSIS — Z7982 Long term (current) use of aspirin: Secondary | ICD-10-CM | POA: Diagnosis not present

## 2019-06-25 DIAGNOSIS — M6281 Muscle weakness (generalized): Secondary | ICD-10-CM | POA: Diagnosis not present

## 2019-06-25 DIAGNOSIS — S72001D Fracture of unspecified part of neck of right femur, subsequent encounter for closed fracture with routine healing: Secondary | ICD-10-CM | POA: Diagnosis not present

## 2019-06-25 LAB — TYPE AND SCREEN
ABO/RH(D): B POS
Antibody Screen: NEGATIVE
Unit division: 0
Unit division: 0

## 2019-06-25 LAB — BPAM RBC
Blood Product Expiration Date: 202008152359
Blood Product Expiration Date: 202008252359
Unit Type and Rh: 7300
Unit Type and Rh: 7300

## 2019-06-27 DIAGNOSIS — M6281 Muscle weakness (generalized): Secondary | ICD-10-CM | POA: Diagnosis not present

## 2019-06-27 DIAGNOSIS — Z7982 Long term (current) use of aspirin: Secondary | ICD-10-CM | POA: Diagnosis not present

## 2019-06-27 DIAGNOSIS — S72001D Fracture of unspecified part of neck of right femur, subsequent encounter for closed fracture with routine healing: Secondary | ICD-10-CM | POA: Diagnosis not present

## 2019-06-27 DIAGNOSIS — Z96641 Presence of right artificial hip joint: Secondary | ICD-10-CM | POA: Diagnosis not present

## 2019-06-27 DIAGNOSIS — Z9181 History of falling: Secondary | ICD-10-CM | POA: Diagnosis not present

## 2019-06-29 DIAGNOSIS — S72001D Fracture of unspecified part of neck of right femur, subsequent encounter for closed fracture with routine healing: Secondary | ICD-10-CM | POA: Diagnosis not present

## 2019-06-29 DIAGNOSIS — Z7982 Long term (current) use of aspirin: Secondary | ICD-10-CM | POA: Diagnosis not present

## 2019-06-29 DIAGNOSIS — Z9181 History of falling: Secondary | ICD-10-CM | POA: Diagnosis not present

## 2019-06-29 DIAGNOSIS — Z96641 Presence of right artificial hip joint: Secondary | ICD-10-CM | POA: Diagnosis not present

## 2019-06-29 DIAGNOSIS — M6281 Muscle weakness (generalized): Secondary | ICD-10-CM | POA: Diagnosis not present

## 2019-07-01 DIAGNOSIS — Z7982 Long term (current) use of aspirin: Secondary | ICD-10-CM | POA: Diagnosis not present

## 2019-07-01 DIAGNOSIS — Z9181 History of falling: Secondary | ICD-10-CM | POA: Diagnosis not present

## 2019-07-01 DIAGNOSIS — S72001D Fracture of unspecified part of neck of right femur, subsequent encounter for closed fracture with routine healing: Secondary | ICD-10-CM | POA: Diagnosis not present

## 2019-07-01 DIAGNOSIS — M6281 Muscle weakness (generalized): Secondary | ICD-10-CM | POA: Diagnosis not present

## 2019-07-01 DIAGNOSIS — Z96641 Presence of right artificial hip joint: Secondary | ICD-10-CM | POA: Diagnosis not present

## 2019-07-04 DIAGNOSIS — Z7982 Long term (current) use of aspirin: Secondary | ICD-10-CM | POA: Diagnosis not present

## 2019-07-04 DIAGNOSIS — S72001D Fracture of unspecified part of neck of right femur, subsequent encounter for closed fracture with routine healing: Secondary | ICD-10-CM | POA: Diagnosis not present

## 2019-07-04 DIAGNOSIS — Z9181 History of falling: Secondary | ICD-10-CM | POA: Diagnosis not present

## 2019-07-04 DIAGNOSIS — Z96641 Presence of right artificial hip joint: Secondary | ICD-10-CM | POA: Diagnosis not present

## 2019-07-04 DIAGNOSIS — M6281 Muscle weakness (generalized): Secondary | ICD-10-CM | POA: Diagnosis not present

## 2019-07-06 DIAGNOSIS — M6281 Muscle weakness (generalized): Secondary | ICD-10-CM | POA: Diagnosis not present

## 2019-07-06 DIAGNOSIS — S72001D Fracture of unspecified part of neck of right femur, subsequent encounter for closed fracture with routine healing: Secondary | ICD-10-CM | POA: Diagnosis not present

## 2019-07-06 DIAGNOSIS — Z9181 History of falling: Secondary | ICD-10-CM | POA: Diagnosis not present

## 2019-07-06 DIAGNOSIS — Z7982 Long term (current) use of aspirin: Secondary | ICD-10-CM | POA: Diagnosis not present

## 2019-07-06 DIAGNOSIS — Z96641 Presence of right artificial hip joint: Secondary | ICD-10-CM | POA: Diagnosis not present

## 2019-07-07 DIAGNOSIS — M25521 Pain in right elbow: Secondary | ICD-10-CM | POA: Diagnosis not present

## 2019-07-07 DIAGNOSIS — Z471 Aftercare following joint replacement surgery: Secondary | ICD-10-CM | POA: Diagnosis not present

## 2019-07-07 DIAGNOSIS — S5001XD Contusion of right elbow, subsequent encounter: Secondary | ICD-10-CM | POA: Diagnosis not present

## 2019-07-07 DIAGNOSIS — Z96641 Presence of right artificial hip joint: Secondary | ICD-10-CM | POA: Diagnosis not present

## 2019-07-08 DIAGNOSIS — Z96641 Presence of right artificial hip joint: Secondary | ICD-10-CM | POA: Diagnosis not present

## 2019-07-08 DIAGNOSIS — S72001D Fracture of unspecified part of neck of right femur, subsequent encounter for closed fracture with routine healing: Secondary | ICD-10-CM | POA: Diagnosis not present

## 2019-07-08 DIAGNOSIS — Z7982 Long term (current) use of aspirin: Secondary | ICD-10-CM | POA: Diagnosis not present

## 2019-07-08 DIAGNOSIS — M6281 Muscle weakness (generalized): Secondary | ICD-10-CM | POA: Diagnosis not present

## 2019-07-08 DIAGNOSIS — Z9181 History of falling: Secondary | ICD-10-CM | POA: Diagnosis not present

## 2019-07-12 DIAGNOSIS — M6281 Muscle weakness (generalized): Secondary | ICD-10-CM | POA: Diagnosis not present

## 2019-07-12 DIAGNOSIS — Z9181 History of falling: Secondary | ICD-10-CM | POA: Diagnosis not present

## 2019-07-12 DIAGNOSIS — S72001D Fracture of unspecified part of neck of right femur, subsequent encounter for closed fracture with routine healing: Secondary | ICD-10-CM | POA: Diagnosis not present

## 2019-07-12 DIAGNOSIS — Z96641 Presence of right artificial hip joint: Secondary | ICD-10-CM | POA: Diagnosis not present

## 2019-07-12 DIAGNOSIS — Z7982 Long term (current) use of aspirin: Secondary | ICD-10-CM | POA: Diagnosis not present

## 2019-07-15 DIAGNOSIS — Z9181 History of falling: Secondary | ICD-10-CM | POA: Diagnosis not present

## 2019-07-15 DIAGNOSIS — Z96641 Presence of right artificial hip joint: Secondary | ICD-10-CM | POA: Diagnosis not present

## 2019-07-15 DIAGNOSIS — S72001D Fracture of unspecified part of neck of right femur, subsequent encounter for closed fracture with routine healing: Secondary | ICD-10-CM | POA: Diagnosis not present

## 2019-07-15 DIAGNOSIS — M6281 Muscle weakness (generalized): Secondary | ICD-10-CM | POA: Diagnosis not present

## 2019-07-15 DIAGNOSIS — Z7982 Long term (current) use of aspirin: Secondary | ICD-10-CM | POA: Diagnosis not present

## 2019-07-19 DIAGNOSIS — Z9181 History of falling: Secondary | ICD-10-CM | POA: Diagnosis not present

## 2019-07-19 DIAGNOSIS — Z7982 Long term (current) use of aspirin: Secondary | ICD-10-CM | POA: Diagnosis not present

## 2019-07-19 DIAGNOSIS — Z96641 Presence of right artificial hip joint: Secondary | ICD-10-CM | POA: Diagnosis not present

## 2019-07-19 DIAGNOSIS — S72001D Fracture of unspecified part of neck of right femur, subsequent encounter for closed fracture with routine healing: Secondary | ICD-10-CM | POA: Diagnosis not present

## 2019-07-19 DIAGNOSIS — M6281 Muscle weakness (generalized): Secondary | ICD-10-CM | POA: Diagnosis not present

## 2019-07-20 DIAGNOSIS — N3020 Other chronic cystitis without hematuria: Secondary | ICD-10-CM | POA: Diagnosis not present

## 2019-07-20 DIAGNOSIS — R350 Frequency of micturition: Secondary | ICD-10-CM | POA: Diagnosis not present

## 2019-07-25 DIAGNOSIS — M6281 Muscle weakness (generalized): Secondary | ICD-10-CM | POA: Diagnosis not present

## 2019-07-25 DIAGNOSIS — Z9181 History of falling: Secondary | ICD-10-CM | POA: Diagnosis not present

## 2019-07-25 DIAGNOSIS — Z96641 Presence of right artificial hip joint: Secondary | ICD-10-CM | POA: Diagnosis not present

## 2019-07-25 DIAGNOSIS — S72001D Fracture of unspecified part of neck of right femur, subsequent encounter for closed fracture with routine healing: Secondary | ICD-10-CM | POA: Diagnosis not present

## 2019-07-25 DIAGNOSIS — Z7982 Long term (current) use of aspirin: Secondary | ICD-10-CM | POA: Diagnosis not present

## 2019-07-28 DIAGNOSIS — R350 Frequency of micturition: Secondary | ICD-10-CM | POA: Diagnosis not present

## 2019-07-28 DIAGNOSIS — E7800 Pure hypercholesterolemia, unspecified: Secondary | ICD-10-CM | POA: Diagnosis not present

## 2019-08-03 DIAGNOSIS — Z8781 Personal history of (healed) traumatic fracture: Secondary | ICD-10-CM | POA: Diagnosis not present

## 2019-08-03 DIAGNOSIS — E7800 Pure hypercholesterolemia, unspecified: Secondary | ICD-10-CM | POA: Diagnosis not present

## 2019-08-03 DIAGNOSIS — Z7982 Long term (current) use of aspirin: Secondary | ICD-10-CM | POA: Diagnosis not present

## 2019-08-03 DIAGNOSIS — Z96641 Presence of right artificial hip joint: Secondary | ICD-10-CM | POA: Diagnosis not present

## 2019-08-03 DIAGNOSIS — M810 Age-related osteoporosis without current pathological fracture: Secondary | ICD-10-CM | POA: Diagnosis not present

## 2019-08-03 DIAGNOSIS — I10 Essential (primary) hypertension: Secondary | ICD-10-CM | POA: Diagnosis not present

## 2019-08-03 DIAGNOSIS — Z9181 History of falling: Secondary | ICD-10-CM | POA: Diagnosis not present

## 2019-08-03 DIAGNOSIS — S72001D Fracture of unspecified part of neck of right femur, subsequent encounter for closed fracture with routine healing: Secondary | ICD-10-CM | POA: Diagnosis not present

## 2019-08-03 DIAGNOSIS — N390 Urinary tract infection, site not specified: Secondary | ICD-10-CM | POA: Diagnosis not present

## 2019-08-03 DIAGNOSIS — M6281 Muscle weakness (generalized): Secondary | ICD-10-CM | POA: Diagnosis not present

## 2019-08-10 DIAGNOSIS — M1711 Unilateral primary osteoarthritis, right knee: Secondary | ICD-10-CM | POA: Diagnosis not present

## 2019-08-10 DIAGNOSIS — M170 Bilateral primary osteoarthritis of knee: Secondary | ICD-10-CM | POA: Diagnosis not present

## 2019-08-10 DIAGNOSIS — Z23 Encounter for immunization: Secondary | ICD-10-CM | POA: Diagnosis not present

## 2019-08-10 DIAGNOSIS — Z471 Aftercare following joint replacement surgery: Secondary | ICD-10-CM | POA: Diagnosis not present

## 2019-08-10 DIAGNOSIS — M25561 Pain in right knee: Secondary | ICD-10-CM | POA: Diagnosis not present

## 2019-08-10 DIAGNOSIS — Z96641 Presence of right artificial hip joint: Secondary | ICD-10-CM | POA: Diagnosis not present

## 2019-08-15 ENCOUNTER — Other Ambulatory Visit: Payer: Self-pay

## 2019-08-15 DIAGNOSIS — Z20822 Contact with and (suspected) exposure to covid-19: Secondary | ICD-10-CM

## 2019-08-16 LAB — NOVEL CORONAVIRUS, NAA: SARS-CoV-2, NAA: DETECTED — AB

## 2019-08-24 ENCOUNTER — Other Ambulatory Visit: Payer: Self-pay | Admitting: *Deleted

## 2019-08-24 DIAGNOSIS — I890 Lymphedema, not elsewhere classified: Secondary | ICD-10-CM

## 2019-09-05 ENCOUNTER — Encounter (HOSPITAL_COMMUNITY): Payer: Self-pay | Admitting: Orthopedic Surgery

## 2019-09-07 ENCOUNTER — Encounter (HOSPITAL_COMMUNITY): Payer: Self-pay | Admitting: Orthopedic Surgery

## 2019-09-08 DIAGNOSIS — R911 Solitary pulmonary nodule: Secondary | ICD-10-CM | POA: Diagnosis not present

## 2019-09-08 DIAGNOSIS — I890 Lymphedema, not elsewhere classified: Secondary | ICD-10-CM | POA: Diagnosis not present

## 2019-09-08 DIAGNOSIS — C50911 Malignant neoplasm of unspecified site of right female breast: Secondary | ICD-10-CM | POA: Diagnosis not present

## 2019-09-12 ENCOUNTER — Telehealth: Payer: Self-pay | Admitting: Physical Therapy

## 2019-09-12 NOTE — Telephone Encounter (Signed)
Phoned pt per [facility] (breast nurse navigator) request. Left a message requesting she call back. When she returns the call, I will let her know that [employer] can cover the cost of her compression sleeve since Medicare does not cover it and she was treated at [facility]. She will need to complete paperwork for that. Annia Friendly, [place] 09/12/19 8:52 AM

## 2019-09-21 DIAGNOSIS — Z96641 Presence of right artificial hip joint: Secondary | ICD-10-CM | POA: Diagnosis not present

## 2019-09-21 DIAGNOSIS — Z471 Aftercare following joint replacement surgery: Secondary | ICD-10-CM | POA: Diagnosis not present

## 2019-09-21 DIAGNOSIS — Z9660 Presence of unspecified orthopedic joint implant: Secondary | ICD-10-CM | POA: Diagnosis not present

## 2019-09-25 DIAGNOSIS — J069 Acute upper respiratory infection, unspecified: Secondary | ICD-10-CM | POA: Diagnosis not present

## 2019-09-25 DIAGNOSIS — Z20828 Contact with and (suspected) exposure to other viral communicable diseases: Secondary | ICD-10-CM | POA: Diagnosis not present

## 2019-10-21 DIAGNOSIS — R399 Unspecified symptoms and signs involving the genitourinary system: Secondary | ICD-10-CM | POA: Diagnosis not present

## 2019-10-21 DIAGNOSIS — N390 Urinary tract infection, site not specified: Secondary | ICD-10-CM | POA: Diagnosis not present

## 2019-10-24 DIAGNOSIS — Z85828 Personal history of other malignant neoplasm of skin: Secondary | ICD-10-CM | POA: Diagnosis not present

## 2019-10-24 DIAGNOSIS — I8393 Asymptomatic varicose veins of bilateral lower extremities: Secondary | ICD-10-CM | POA: Diagnosis not present

## 2019-10-24 DIAGNOSIS — L570 Actinic keratosis: Secondary | ICD-10-CM | POA: Diagnosis not present

## 2019-10-24 DIAGNOSIS — N8111 Cystocele, midline: Secondary | ICD-10-CM | POA: Diagnosis not present

## 2019-10-24 DIAGNOSIS — L821 Other seborrheic keratosis: Secondary | ICD-10-CM | POA: Diagnosis not present

## 2019-10-24 DIAGNOSIS — D229 Melanocytic nevi, unspecified: Secondary | ICD-10-CM | POA: Diagnosis not present

## 2019-10-24 DIAGNOSIS — L814 Other melanin hyperpigmentation: Secondary | ICD-10-CM | POA: Diagnosis not present

## 2019-10-24 DIAGNOSIS — Z01419 Encounter for gynecological examination (general) (routine) without abnormal findings: Secondary | ICD-10-CM | POA: Diagnosis not present

## 2019-10-24 DIAGNOSIS — D1801 Hemangioma of skin and subcutaneous tissue: Secondary | ICD-10-CM | POA: Diagnosis not present

## 2019-12-10 ENCOUNTER — Other Ambulatory Visit: Payer: Self-pay | Admitting: Nurse Practitioner

## 2019-12-14 ENCOUNTER — Other Ambulatory Visit: Payer: Self-pay | Admitting: Nurse Practitioner

## 2019-12-14 ENCOUNTER — Telehealth: Payer: Self-pay | Admitting: *Deleted

## 2019-12-14 NOTE — Telephone Encounter (Signed)
Received a call from patient stating that she received a MyChart notification that she had a visit with someone at the [facility] in [place] with a visit summary attached even thought patient did not have an appointment or see anyone.  Told patient we will look into this.  Hilario Quarry Nurse Director will send an email to get an answer.  Patient appreciative of this information

## 2019-12-21 DIAGNOSIS — M25552 Pain in left hip: Secondary | ICD-10-CM | POA: Diagnosis not present

## 2019-12-21 DIAGNOSIS — M7062 Trochanteric bursitis, left hip: Secondary | ICD-10-CM | POA: Diagnosis not present

## 2019-12-21 DIAGNOSIS — Z96641 Presence of right artificial hip joint: Secondary | ICD-10-CM | POA: Diagnosis not present

## 2020-01-17 DIAGNOSIS — M21611 Bunion of right foot: Secondary | ICD-10-CM | POA: Diagnosis not present

## 2020-01-17 DIAGNOSIS — M2042 Other hammer toe(s) (acquired), left foot: Secondary | ICD-10-CM | POA: Diagnosis not present

## 2020-01-17 DIAGNOSIS — M21612 Bunion of left foot: Secondary | ICD-10-CM | POA: Diagnosis not present

## 2020-01-17 DIAGNOSIS — M2041 Other hammer toe(s) (acquired), right foot: Secondary | ICD-10-CM | POA: Diagnosis not present

## 2020-01-19 DIAGNOSIS — H10411 Chronic giant papillary conjunctivitis, right eye: Secondary | ICD-10-CM | POA: Diagnosis not present

## 2020-01-30 DIAGNOSIS — N183 Chronic kidney disease, stage 3 unspecified: Secondary | ICD-10-CM | POA: Diagnosis not present

## 2020-01-30 DIAGNOSIS — E785 Hyperlipidemia, unspecified: Secondary | ICD-10-CM | POA: Diagnosis not present

## 2020-01-30 DIAGNOSIS — Z1231 Encounter for screening mammogram for malignant neoplasm of breast: Secondary | ICD-10-CM | POA: Diagnosis not present

## 2020-01-30 DIAGNOSIS — M810 Age-related osteoporosis without current pathological fracture: Secondary | ICD-10-CM | POA: Diagnosis not present

## 2020-01-30 DIAGNOSIS — I129 Hypertensive chronic kidney disease with stage 1 through stage 4 chronic kidney disease, or unspecified chronic kidney disease: Secondary | ICD-10-CM | POA: Diagnosis not present

## 2020-01-30 DIAGNOSIS — N390 Urinary tract infection, site not specified: Secondary | ICD-10-CM | POA: Diagnosis not present

## 2020-01-30 DIAGNOSIS — E1122 Type 2 diabetes mellitus with diabetic chronic kidney disease: Secondary | ICD-10-CM | POA: Diagnosis not present

## 2020-02-02 DIAGNOSIS — E875 Hyperkalemia: Secondary | ICD-10-CM | POA: Diagnosis not present

## 2020-02-02 DIAGNOSIS — N390 Urinary tract infection, site not specified: Secondary | ICD-10-CM | POA: Diagnosis not present

## 2020-02-08 DIAGNOSIS — E785 Hyperlipidemia, unspecified: Secondary | ICD-10-CM | POA: Diagnosis not present

## 2020-02-08 DIAGNOSIS — E1121 Type 2 diabetes mellitus with diabetic nephropathy: Secondary | ICD-10-CM | POA: Diagnosis not present

## 2020-02-08 DIAGNOSIS — I10 Essential (primary) hypertension: Secondary | ICD-10-CM | POA: Diagnosis not present

## 2020-02-08 DIAGNOSIS — Z0000 Encounter for general adult medical examination without abnormal findings: Secondary | ICD-10-CM | POA: Diagnosis not present

## 2020-02-08 DIAGNOSIS — M810 Age-related osteoporosis without current pathological fracture: Secondary | ICD-10-CM | POA: Diagnosis not present

## 2020-02-08 DIAGNOSIS — N3001 Acute cystitis with hematuria: Secondary | ICD-10-CM | POA: Diagnosis not present

## 2020-02-09 DIAGNOSIS — M25562 Pain in left knee: Secondary | ICD-10-CM | POA: Diagnosis not present

## 2020-02-09 DIAGNOSIS — M1712 Unilateral primary osteoarthritis, left knee: Secondary | ICD-10-CM | POA: Diagnosis not present

## 2020-02-16 DIAGNOSIS — N3020 Other chronic cystitis without hematuria: Secondary | ICD-10-CM | POA: Diagnosis not present

## 2020-02-22 DIAGNOSIS — L118 Other specified acantholytic disorders: Secondary | ICD-10-CM | POA: Diagnosis not present

## 2020-02-22 DIAGNOSIS — D485 Neoplasm of uncertain behavior of skin: Secondary | ICD-10-CM | POA: Diagnosis not present

## 2020-02-22 DIAGNOSIS — L821 Other seborrheic keratosis: Secondary | ICD-10-CM | POA: Diagnosis not present

## 2020-03-12 DIAGNOSIS — M810 Age-related osteoporosis without current pathological fracture: Secondary | ICD-10-CM | POA: Diagnosis not present

## 2020-04-17 DIAGNOSIS — M25562 Pain in left knee: Secondary | ICD-10-CM | POA: Diagnosis not present

## 2020-04-17 DIAGNOSIS — M25552 Pain in left hip: Secondary | ICD-10-CM | POA: Diagnosis not present

## 2020-05-10 ENCOUNTER — Telehealth: Payer: Self-pay | Admitting: Hematology & Oncology

## 2020-05-10 ENCOUNTER — Inpatient Hospital Stay: Payer: Medicare Other | Attending: Hematology & Oncology

## 2020-05-10 ENCOUNTER — Other Ambulatory Visit: Payer: Self-pay

## 2020-05-10 ENCOUNTER — Inpatient Hospital Stay (HOSPITAL_BASED_OUTPATIENT_CLINIC_OR_DEPARTMENT_OTHER): Payer: Medicare Other | Admitting: Hematology & Oncology

## 2020-05-10 ENCOUNTER — Inpatient Hospital Stay: Payer: Medicare Other

## 2020-05-10 ENCOUNTER — Encounter: Payer: Self-pay | Admitting: Hematology & Oncology

## 2020-05-10 VITALS — BP 170/61 | HR 50 | Temp 98.7°F | Resp 18 | Wt 193.8 lb

## 2020-05-10 DIAGNOSIS — T50905A Adverse effect of unspecified drugs, medicaments and biological substances, initial encounter: Secondary | ICD-10-CM

## 2020-05-10 DIAGNOSIS — M8580 Other specified disorders of bone density and structure, unspecified site: Secondary | ICD-10-CM | POA: Diagnosis not present

## 2020-05-10 DIAGNOSIS — M818 Other osteoporosis without current pathological fracture: Secondary | ICD-10-CM

## 2020-05-10 DIAGNOSIS — C50011 Malignant neoplasm of nipple and areola, right female breast: Secondary | ICD-10-CM | POA: Diagnosis not present

## 2020-05-10 DIAGNOSIS — C50911 Malignant neoplasm of unspecified site of right female breast: Secondary | ICD-10-CM | POA: Diagnosis present

## 2020-05-10 DIAGNOSIS — Z170 Estrogen receptor positive status [ER+]: Secondary | ICD-10-CM | POA: Diagnosis present

## 2020-05-10 LAB — CMP (CANCER CENTER ONLY)
ALT: 13 U/L (ref 0–44)
AST: 14 U/L — ABNORMAL LOW (ref 15–41)
Albumin: 4.5 g/dL (ref 3.5–5.0)
Alkaline Phosphatase: 35 U/L — ABNORMAL LOW (ref 38–126)
Anion gap: 7 (ref 5–15)
BUN: 27 mg/dL — ABNORMAL HIGH (ref 8–23)
CO2: 28 mmol/L (ref 22–32)
Calcium: 9.8 mg/dL (ref 8.9–10.3)
Chloride: 105 mmol/L (ref 98–111)
Creatinine: 1.21 mg/dL — ABNORMAL HIGH (ref 0.44–1.00)
GFR, Est AFR Am: 50 mL/min — ABNORMAL LOW (ref >60)
GFR, Estimated: 43 mL/min — ABNORMAL LOW (ref >60)
Glucose, Bld: 107 mg/dL — ABNORMAL HIGH (ref 70–99)
Potassium: 4.3 mmol/L (ref 3.5–5.1)
Sodium: 140 mmol/L (ref 135–145)
Total Bilirubin: 1.2 mg/dL (ref 0.3–1.2)
Total Protein: 7.3 g/dL (ref 6.5–8.1)

## 2020-05-10 LAB — CBC WITH DIFFERENTIAL (CANCER CENTER ONLY)
Abs Immature Granulocytes: 0.02 10*3/uL (ref 0.00–0.07)
Basophils Absolute: 0 10*3/uL (ref 0.0–0.1)
Basophils Relative: 1 %
Eosinophils Absolute: 0.1 10*3/uL (ref 0.0–0.5)
Eosinophils Relative: 1 %
HCT: 41.7 % (ref 36.0–46.0)
Hemoglobin: 13.4 g/dL (ref 12.0–15.0)
Immature Granulocytes: 0 %
Lymphocytes Relative: 28 %
Lymphs Abs: 1.9 10*3/uL (ref 0.7–4.0)
MCH: 30.5 pg (ref 26.0–34.0)
MCHC: 32.1 g/dL (ref 30.0–36.0)
MCV: 95 fL (ref 80.0–100.0)
Monocytes Absolute: 0.5 10*3/uL (ref 0.1–1.0)
Monocytes Relative: 7 %
Neutro Abs: 4.2 10*3/uL (ref 1.7–7.7)
Neutrophils Relative %: 63 %
Platelet Count: 144 10*3/uL — ABNORMAL LOW (ref 150–400)
RBC: 4.39 MIL/uL (ref 3.87–5.11)
RDW: 13.1 % (ref 11.5–15.5)
WBC Count: 6.7 10*3/uL (ref 4.0–10.5)
nRBC: 0 % (ref 0.0–0.2)

## 2020-05-10 MED ORDER — ALTEPLASE 2 MG IJ SOLR
2.0000 mg | Freq: Once | INTRAMUSCULAR | Status: DC | PRN
  Filled 2020-05-10: qty 2

## 2020-05-10 MED ORDER — SODIUM CHLORIDE 0.9 % IV SOLN
Freq: Once | INTRAVENOUS | Status: AC
  Filled 2020-05-10: qty 250

## 2020-05-10 MED ORDER — ZOLEDRONIC ACID 4 MG/100ML IV SOLN
4.0000 mg | Freq: Once | INTRAVENOUS | Status: AC
  Administered 2020-05-10: 4 mg via INTRAVENOUS
  Filled 2020-05-10: qty 100

## 2020-05-10 MED ORDER — AZITHROMYCIN 250 MG PO TABS: ORAL_TABLET | ORAL | 3 refills | Status: DC

## 2020-05-10 NOTE — Telephone Encounter (Signed)
Appointments scheduled calendar printed per 6/24 los 

## 2020-05-10 NOTE — Patient Instructions (Signed)

## 2020-05-10 NOTE — Progress Notes (Signed)
Hematology and Oncology Follow Up Visit  KI LUCKMAN [id number] [birth year] 79 y.o. 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 Schemm is here today for yearly follow-up.  She actually had the coronavirus back in September.  She was not hospitalized.  She got through and basically at home.  I know that she took any antibiotics for it.  Unfortunately, she fell back in August of last year.  She broke her right hip.  She had hip replacement.  She certainly has recovered nicely from this.  Otherwise she is doing okay.  She apparently had a bone density test done which showed some osteopenia.  She was told to take some oral calcium.  She cannot tolerate the oral calcium.  She is not taking vitamin D.  I told her to take vitamin D at 2000 units daily.  There has been no problems with nausea or vomiting.  There is been no cough or shortness of breath.  She has had no rashes.  There is been no leg swelling.  She has had no change in bowel or bladder habits.    Overall, her performance status is ECOG 0.   Medications:  Allergies as of 05/10/2020      Reactions   Codeine Sulfate Swelling   Fish-derived Products Other (See Comments)   Cellulitis    Rocephin [ceftriaxone Sodium] Other (See Comments)   Low blood count   Sulfa Antibiotics Other (See Comments)   Mouth and lip sores      Medication List       Accurate as of May 10, 2020  8:06 AM. If you have any questions, ask your nurse or doctor.        acetaminophen 500 MG tablet Commonly known as: TYLENOL Take 2 tablets (1,000 mg total) by mouth every 8 (eight) hours.   ALPRAZolam 0.5 MG tablet Commonly known as: XANAX Take 0.5 mg by mouth daily as needed for anxiety.   amLODipine 5 MG tablet Commonly known as:  NORVASC Take 5 mg by mouth daily.   Caltrate 600+D Plus Minerals 600-800 MG-UNIT Chew Chew 1 tablet by mouth daily.   cetirizine 10 MG tablet Commonly known as: ZYRTEC Take 1 tablet (10 mg total) by mouth daily. 1 po q day prn allergies   Cozaar 100 MG tablet Generic drug: losartan Take 100 mg by mouth daily.   docusate sodium 100 MG capsule Commonly known as: Colace Take 1 capsule (100 mg total) by mouth 2 (two) times daily.   ferrous sulfate 325 (65 FE) MG tablet Commonly known as: FerrouSul Take 1 tablet (325 mg total) by mouth 3 (three) times daily with meals for 14 days.   fluticasone 50 MCG/ACT nasal spray Commonly known as: FLONASE Place 1-2 sprays into both nostrils daily as needed for allergies or rhinitis.   meclizine 25 MG tablet Commonly known as: ANTIVERT Take 25 mg by mouth 3 (three) times daily as needed for dizziness.   methocarbamol 500 MG tablet Commonly known as: Robaxin Take 1 tablet (500 mg total) by mouth every 6 (six) hours as needed for muscle spasms.   ondansetron 4 MG tablet Commonly known as: ZOFRAN Take 1 tablet (4 mg total) by mouth every 8 (eight) hours as needed for  nausea or vomiting.   polyethylene glycol 17 g packet Commonly known as: MIRALAX / GLYCOLAX Take 17 g by mouth 2 (two) times daily.   rosuvastatin 5 MG tablet Commonly known as: CRESTOR Take 5 mg by mouth daily.   traMADol 50 MG tablet Commonly known as: Ultram Take 1-2 tablets (50-100 mg total) by mouth every 6 (six) hours as needed.   ZOMETA IV Inject 1 each into the skin See admin instructions. One a year       Allergies:  Allergies  Allergen Reactions  . Codeine Sulfate Swelling  . Fish-Derived Products Other (See Comments)    Cellulitis   . Rocephin [Ceftriaxone Sodium] Other (See Comments)    Low blood count  . Sulfa Antibiotics Other (See Comments)    Mouth and lip sores    Past Medical History, Surgical history, Social history, and Family History  were reviewed and updated.  Review of Systems: Review of Systems  Constitutional: Negative.   HENT: Negative.   Eyes: Negative.   Respiratory: Negative.   Cardiovascular: Negative.   Gastrointestinal: Negative.   Genitourinary: Negative.   Musculoskeletal: Negative.   Skin: Negative.   Neurological: Negative.   Endo/Heme/Allergies: Negative.   Psychiatric/Behavioral: Negative.      Physical Exam:  vitals were not taken for this visit.   Wt Readings from Last 3 Encounters:  06/20/19 193 lb (87.5 kg)  05/25/19 194 lb (88 kg)  05/17/19 194 lb (88 kg)    Physical Exam Vitals reviewed.  Constitutional:      Comments: Breast exam shows left breast with no masses edema or erythema.  There is no left axillary adenopathy.  Right chest wall shows well-healed mastectomy.  She has no chest wall masses.  There is no erythema on the right anterior chest wall.  There is no right axillary adenopathy.  HENT:     Head: Normocephalic and atraumatic.  Eyes:     Pupils: Pupils are equal, round, and reactive to light.  Cardiovascular:     Rate and Rhythm: Normal rate and regular rhythm.     Heart sounds: Normal heart sounds.  Pulmonary:     Effort: Pulmonary effort is normal.     Breath sounds: Normal breath sounds.  Abdominal:     General: Bowel sounds are normal.     Palpations: Abdomen is soft.  Musculoskeletal:        General: No tenderness or deformity. Normal range of motion.     Cervical back: Normal range of motion.     Comments: There is edema of the right arm.  There is no erythema.  Lymphadenopathy:     Cervical: No cervical adenopathy.  Skin:    General: Skin is warm and dry.     Findings: No erythema or rash.  Neurological:     Mental Status: She is alert and oriented to person, place, and time.  Psychiatric:        Behavior: Behavior normal.        Thought Content: Thought content normal.        Judgment: Judgment normal.      Lab Results  Component Value Date    WBC 6.7 05/10/2020   HGB 13.4 05/10/2020   HCT 41.7 05/10/2020   MCV 95.0 05/10/2020   PLT 144 (L) 05/10/2020   No results found for: FERRITIN, IRON, TIBC, UIBC, IRONPCTSAT Lab Results  Component Value Date   RBC 4.39 05/10/2020   No results found for: KPAFRELGTCHN, LAMBDASER, KAPLAMBRATIO No  results found for: IGGSERUM, IGA, IGMSERUM No results found for: Kathrynn Ducking, MSPIKE, SPEI   Chemistry      Component Value Date/Time   NA 140 06/24/2019 0236   NA 146 (H) 11/09/2017 1135   NA 144 05/24/2013 0959   K 4.2 06/24/2019 0236   K 3.9 11/09/2017 1135   K 3.9 05/24/2013 0959   CL 109 06/24/2019 0236   CL 104 11/09/2017 1135   CO2 24 06/24/2019 0236   CO2 28 11/09/2017 1135   CO2 27 05/24/2013 0959   BUN 23 06/24/2019 0236   BUN 23 (H) 11/09/2017 1135   BUN 19.7 05/24/2013 0959   CREATININE 1.12 (H) 06/24/2019 0236   CREATININE 1.10 (H) 05/11/2019 0746   CREATININE 1.1 11/09/2017 1135   CREATININE 1.1 05/24/2013 0959      Component Value Date/Time   CALCIUM 8.4 (L) 06/24/2019 0236   CALCIUM 9.4 11/09/2017 1135   CALCIUM 9.8 05/24/2013 0959   ALKPHOS 44 06/21/2019 0518   ALKPHOS 57 11/09/2017 1135   AST 26 06/21/2019 0518   AST 17 05/11/2019 0746   ALT 19 06/21/2019 0518   ALT 13 05/11/2019 0746   ALT 23 11/09/2017 1135   BILITOT 1.1 06/21/2019 0518   BILITOT 1.0 05/11/2019 0746      Impression and Plan: Ms. Lague is a very pleasant 79 yo caucasian female with history of 2 separate primary breast cancers, both stage 1 and ER positive.   She continues to do well and so far there has been no evidence of recurrence.   She will receive her Zometa today as planned.   We will see her back again in another year for follow-up.   They will contact our office with any questions or concerns. We can certainly see her sooner if need be.   [name], MD 6/24/20218:06 AM

## 2020-05-30 DIAGNOSIS — R309 Painful micturition, unspecified: Secondary | ICD-10-CM | POA: Diagnosis not present

## 2020-06-06 DIAGNOSIS — Z85828 Personal history of other malignant neoplasm of skin: Secondary | ICD-10-CM | POA: Diagnosis not present

## 2020-06-06 DIAGNOSIS — L820 Inflamed seborrheic keratosis: Secondary | ICD-10-CM | POA: Diagnosis not present

## 2020-06-06 DIAGNOSIS — L905 Scar conditions and fibrosis of skin: Secondary | ICD-10-CM | POA: Diagnosis not present

## 2020-06-07 DIAGNOSIS — H00014 Hordeolum externum left upper eyelid: Secondary | ICD-10-CM | POA: Diagnosis not present

## 2020-06-14 DIAGNOSIS — N3020 Other chronic cystitis without hematuria: Secondary | ICD-10-CM | POA: Diagnosis not present

## 2020-06-25 DIAGNOSIS — M4185 Other forms of scoliosis, thoracolumbar region: Secondary | ICD-10-CM | POA: Diagnosis not present

## 2020-06-25 DIAGNOSIS — M7918 Myalgia, other site: Secondary | ICD-10-CM | POA: Diagnosis not present

## 2020-06-25 DIAGNOSIS — Z96641 Presence of right artificial hip joint: Secondary | ICD-10-CM | POA: Diagnosis not present

## 2020-06-29 ENCOUNTER — Encounter: Payer: Self-pay | Admitting: Genetic Counselor

## 2020-08-07 DIAGNOSIS — E785 Hyperlipidemia, unspecified: Secondary | ICD-10-CM | POA: Diagnosis not present

## 2020-08-07 DIAGNOSIS — M810 Age-related osteoporosis without current pathological fracture: Secondary | ICD-10-CM | POA: Diagnosis not present

## 2020-08-07 DIAGNOSIS — E1121 Type 2 diabetes mellitus with diabetic nephropathy: Secondary | ICD-10-CM | POA: Diagnosis not present

## 2020-08-07 DIAGNOSIS — E1122 Type 2 diabetes mellitus with diabetic chronic kidney disease: Secondary | ICD-10-CM | POA: Diagnosis not present

## 2020-08-10 DIAGNOSIS — Z86010 Personal history of colonic polyps: Secondary | ICD-10-CM | POA: Diagnosis not present

## 2020-08-10 DIAGNOSIS — E875 Hyperkalemia: Secondary | ICD-10-CM | POA: Diagnosis not present

## 2020-08-10 DIAGNOSIS — E7800 Pure hypercholesterolemia, unspecified: Secondary | ICD-10-CM | POA: Diagnosis not present

## 2020-08-10 DIAGNOSIS — I10 Essential (primary) hypertension: Secondary | ICD-10-CM | POA: Diagnosis not present

## 2020-08-10 DIAGNOSIS — Z23 Encounter for immunization: Secondary | ICD-10-CM | POA: Diagnosis not present

## 2020-08-20 DIAGNOSIS — I10 Essential (primary) hypertension: Secondary | ICD-10-CM | POA: Diagnosis not present

## 2020-08-20 DIAGNOSIS — Z86010 Personal history of colonic polyps: Secondary | ICD-10-CM | POA: Diagnosis not present

## 2020-08-20 DIAGNOSIS — E782 Mixed hyperlipidemia: Secondary | ICD-10-CM | POA: Diagnosis not present

## 2020-08-22 DIAGNOSIS — E875 Hyperkalemia: Secondary | ICD-10-CM | POA: Diagnosis not present

## 2020-08-22 DIAGNOSIS — N1831 Chronic kidney disease, stage 3a: Secondary | ICD-10-CM | POA: Diagnosis not present

## 2020-08-27 DIAGNOSIS — M545 Low back pain, unspecified: Secondary | ICD-10-CM | POA: Diagnosis not present

## 2020-08-27 DIAGNOSIS — M1712 Unilateral primary osteoarthritis, left knee: Secondary | ICD-10-CM | POA: Diagnosis not present

## 2020-08-27 DIAGNOSIS — M25552 Pain in left hip: Secondary | ICD-10-CM | POA: Diagnosis not present

## 2020-08-27 DIAGNOSIS — M7061 Trochanteric bursitis, right hip: Secondary | ICD-10-CM | POA: Diagnosis not present

## 2020-09-10 DIAGNOSIS — C50911 Malignant neoplasm of unspecified site of right female breast: Secondary | ICD-10-CM | POA: Diagnosis not present

## 2020-09-13 DIAGNOSIS — N3020 Other chronic cystitis without hematuria: Secondary | ICD-10-CM | POA: Diagnosis not present

## 2020-09-17 DIAGNOSIS — Z23 Encounter for immunization: Secondary | ICD-10-CM | POA: Diagnosis not present

## 2020-10-01 DIAGNOSIS — N3020 Other chronic cystitis without hematuria: Secondary | ICD-10-CM | POA: Diagnosis not present

## 2020-10-01 DIAGNOSIS — R1032 Left lower quadrant pain: Secondary | ICD-10-CM | POA: Diagnosis not present

## 2020-10-04 DIAGNOSIS — R202 Paresthesia of skin: Secondary | ICD-10-CM | POA: Diagnosis not present

## 2020-10-15 DIAGNOSIS — N3020 Other chronic cystitis without hematuria: Secondary | ICD-10-CM | POA: Diagnosis not present

## 2020-10-23 DIAGNOSIS — L821 Other seborrheic keratosis: Secondary | ICD-10-CM | POA: Diagnosis not present

## 2020-10-23 DIAGNOSIS — D229 Melanocytic nevi, unspecified: Secondary | ICD-10-CM | POA: Diagnosis not present

## 2020-10-23 DIAGNOSIS — L819 Disorder of pigmentation, unspecified: Secondary | ICD-10-CM | POA: Diagnosis not present

## 2020-10-23 DIAGNOSIS — I8393 Asymptomatic varicose veins of bilateral lower extremities: Secondary | ICD-10-CM | POA: Diagnosis not present

## 2020-10-23 DIAGNOSIS — L814 Other melanin hyperpigmentation: Secondary | ICD-10-CM | POA: Diagnosis not present

## 2020-10-23 DIAGNOSIS — D1801 Hemangioma of skin and subcutaneous tissue: Secondary | ICD-10-CM | POA: Diagnosis not present

## 2020-10-23 DIAGNOSIS — Z85828 Personal history of other malignant neoplasm of skin: Secondary | ICD-10-CM | POA: Diagnosis not present

## 2020-10-23 DIAGNOSIS — L905 Scar conditions and fibrosis of skin: Secondary | ICD-10-CM | POA: Diagnosis not present

## 2020-10-29 DIAGNOSIS — M545 Low back pain, unspecified: Secondary | ICD-10-CM | POA: Diagnosis not present

## 2020-10-29 DIAGNOSIS — S5002XA Contusion of left elbow, initial encounter: Secondary | ICD-10-CM | POA: Diagnosis not present

## 2020-10-29 DIAGNOSIS — S7002XA Contusion of left hip, initial encounter: Secondary | ICD-10-CM | POA: Diagnosis not present

## 2020-11-16 DIAGNOSIS — Z1152 Encounter for screening for COVID-19: Secondary | ICD-10-CM | POA: Diagnosis not present

## 2020-11-16 DIAGNOSIS — J029 Acute pharyngitis, unspecified: Secondary | ICD-10-CM | POA: Diagnosis not present

## 2020-11-16 DIAGNOSIS — N390 Urinary tract infection, site not specified: Secondary | ICD-10-CM | POA: Diagnosis not present

## 2020-11-16 DIAGNOSIS — Z03818 Encounter for observation for suspected exposure to other biological agents ruled out: Secondary | ICD-10-CM | POA: Diagnosis not present

## 2020-11-17 HISTORY — PX: TOTAL HIP ARTHROPLASTY: SHX124

## 2020-12-19 DIAGNOSIS — M21611 Bunion of right foot: Secondary | ICD-10-CM | POA: Diagnosis not present

## 2020-12-19 DIAGNOSIS — M2041 Other hammer toe(s) (acquired), right foot: Secondary | ICD-10-CM | POA: Diagnosis not present

## 2020-12-19 DIAGNOSIS — M21612 Bunion of left foot: Secondary | ICD-10-CM | POA: Diagnosis not present

## 2020-12-19 DIAGNOSIS — M21961 Unspecified acquired deformity of right lower leg: Secondary | ICD-10-CM | POA: Diagnosis not present

## 2020-12-19 DIAGNOSIS — G5761 Lesion of plantar nerve, right lower limb: Secondary | ICD-10-CM | POA: Diagnosis not present

## 2021-01-22 DIAGNOSIS — R1032 Left lower quadrant pain: Secondary | ICD-10-CM | POA: Diagnosis not present

## 2021-01-22 DIAGNOSIS — N3020 Other chronic cystitis without hematuria: Secondary | ICD-10-CM | POA: Diagnosis not present

## 2021-02-04 DIAGNOSIS — Z1231 Encounter for screening mammogram for malignant neoplasm of breast: Secondary | ICD-10-CM | POA: Diagnosis not present

## 2021-02-07 DIAGNOSIS — R300 Dysuria: Secondary | ICD-10-CM | POA: Diagnosis not present

## 2021-02-07 DIAGNOSIS — I10 Essential (primary) hypertension: Secondary | ICD-10-CM | POA: Diagnosis not present

## 2021-02-07 DIAGNOSIS — E1122 Type 2 diabetes mellitus with diabetic chronic kidney disease: Secondary | ICD-10-CM | POA: Diagnosis not present

## 2021-02-07 DIAGNOSIS — E1121 Type 2 diabetes mellitus with diabetic nephropathy: Secondary | ICD-10-CM | POA: Diagnosis not present

## 2021-02-07 DIAGNOSIS — N183 Chronic kidney disease, stage 3 unspecified: Secondary | ICD-10-CM | POA: Diagnosis not present

## 2021-02-07 DIAGNOSIS — M810 Age-related osteoporosis without current pathological fracture: Secondary | ICD-10-CM | POA: Diagnosis not present

## 2021-02-07 DIAGNOSIS — E119 Type 2 diabetes mellitus without complications: Secondary | ICD-10-CM | POA: Diagnosis not present

## 2021-02-07 DIAGNOSIS — E785 Hyperlipidemia, unspecified: Secondary | ICD-10-CM | POA: Diagnosis not present

## 2021-02-07 DIAGNOSIS — E538 Deficiency of other specified B group vitamins: Secondary | ICD-10-CM | POA: Diagnosis not present

## 2021-02-08 ENCOUNTER — Telehealth: Payer: Self-pay

## 2021-02-08 NOTE — Telephone Encounter (Signed)
Called and left a vm with a new appt as Judson Roch will be out of the office 05/10/21    Laverle Pillard

## 2021-02-13 DIAGNOSIS — I10 Essential (primary) hypertension: Secondary | ICD-10-CM | POA: Diagnosis not present

## 2021-02-13 DIAGNOSIS — E119 Type 2 diabetes mellitus without complications: Secondary | ICD-10-CM | POA: Diagnosis not present

## 2021-02-13 DIAGNOSIS — E538 Deficiency of other specified B group vitamins: Secondary | ICD-10-CM | POA: Diagnosis not present

## 2021-02-13 DIAGNOSIS — E7800 Pure hypercholesterolemia, unspecified: Secondary | ICD-10-CM | POA: Diagnosis not present

## 2021-02-13 DIAGNOSIS — E559 Vitamin D deficiency, unspecified: Secondary | ICD-10-CM | POA: Diagnosis not present

## 2021-02-13 DIAGNOSIS — Z0000 Encounter for general adult medical examination without abnormal findings: Secondary | ICD-10-CM | POA: Diagnosis not present

## 2021-02-25 DIAGNOSIS — M1712 Unilateral primary osteoarthritis, left knee: Secondary | ICD-10-CM | POA: Diagnosis not present

## 2021-02-25 DIAGNOSIS — M7062 Trochanteric bursitis, left hip: Secondary | ICD-10-CM | POA: Diagnosis not present

## 2021-03-05 DIAGNOSIS — L219 Seborrheic dermatitis, unspecified: Secondary | ICD-10-CM | POA: Diagnosis not present

## 2021-03-06 DIAGNOSIS — R350 Frequency of micturition: Secondary | ICD-10-CM | POA: Diagnosis not present

## 2021-03-06 DIAGNOSIS — N3020 Other chronic cystitis without hematuria: Secondary | ICD-10-CM | POA: Diagnosis not present

## 2021-05-10 ENCOUNTER — Ambulatory Visit: Payer: Medicare Other | Admitting: Family

## 2021-05-10 ENCOUNTER — Other Ambulatory Visit: Payer: Medicare Other

## 2021-05-10 ENCOUNTER — Ambulatory Visit: Payer: Medicare Other

## 2021-05-13 ENCOUNTER — Other Ambulatory Visit: Payer: Self-pay

## 2021-05-13 ENCOUNTER — Encounter: Payer: Self-pay | Admitting: Family

## 2021-05-13 ENCOUNTER — Other Ambulatory Visit: Payer: Self-pay | Admitting: *Deleted

## 2021-05-13 ENCOUNTER — Telehealth: Payer: Self-pay | Admitting: *Deleted

## 2021-05-13 ENCOUNTER — Inpatient Hospital Stay: Payer: Medicare Other

## 2021-05-13 ENCOUNTER — Inpatient Hospital Stay (HOSPITAL_BASED_OUTPATIENT_CLINIC_OR_DEPARTMENT_OTHER): Payer: Medicare Other | Admitting: Family

## 2021-05-13 ENCOUNTER — Inpatient Hospital Stay: Payer: Medicare Other | Attending: Family

## 2021-05-13 VITALS — BP 141/71 | HR 55 | Temp 97.7°F | Resp 16 | Wt 187.0 lb

## 2021-05-13 DIAGNOSIS — M818 Other osteoporosis without current pathological fracture: Secondary | ICD-10-CM

## 2021-05-13 DIAGNOSIS — C50011 Malignant neoplasm of nipple and areola, right female breast: Secondary | ICD-10-CM

## 2021-05-13 DIAGNOSIS — E559 Vitamin D deficiency, unspecified: Secondary | ICD-10-CM

## 2021-05-13 DIAGNOSIS — T50905A Adverse effect of unspecified drugs, medicaments and biological substances, initial encounter: Secondary | ICD-10-CM | POA: Diagnosis not present

## 2021-05-13 DIAGNOSIS — C50911 Malignant neoplasm of unspecified site of right female breast: Secondary | ICD-10-CM | POA: Insufficient documentation

## 2021-05-13 DIAGNOSIS — Z170 Estrogen receptor positive status [ER+]: Secondary | ICD-10-CM | POA: Diagnosis not present

## 2021-05-13 LAB — CBC WITH DIFFERENTIAL (CANCER CENTER ONLY)
Abs Immature Granulocytes: 0.06 10*3/uL (ref 0.00–0.07)
Basophils Absolute: 0.1 10*3/uL (ref 0.0–0.1)
Basophils Relative: 1 %
Eosinophils Absolute: 0.2 10*3/uL (ref 0.0–0.5)
Eosinophils Relative: 3 %
HCT: 40.3 % (ref 36.0–46.0)
Hemoglobin: 12.7 g/dL (ref 12.0–15.0)
Immature Granulocytes: 1 %
Lymphocytes Relative: 32 %
Lymphs Abs: 1.9 10*3/uL (ref 0.7–4.0)
MCH: 29.3 pg (ref 26.0–34.0)
MCHC: 31.5 g/dL (ref 30.0–36.0)
MCV: 92.9 fL (ref 80.0–100.0)
Monocytes Absolute: 0.5 10*3/uL (ref 0.1–1.0)
Monocytes Relative: 9 %
Neutro Abs: 3.2 10*3/uL (ref 1.7–7.7)
Neutrophils Relative %: 54 %
Platelet Count: 160 10*3/uL (ref 150–400)
RBC: 4.34 MIL/uL (ref 3.87–5.11)
RDW: 13.1 % (ref 11.5–15.5)
WBC Count: 5.9 10*3/uL (ref 4.0–10.5)
nRBC: 0 % (ref 0.0–0.2)

## 2021-05-13 LAB — COMPREHENSIVE METABOLIC PANEL
ALT: 10 U/L (ref 0–44)
AST: 16 U/L (ref 15–41)
Albumin: 4.4 g/dL (ref 3.5–5.0)
Alkaline Phosphatase: 47 U/L (ref 38–126)
Anion gap: 9 (ref 5–15)
BUN: 22 mg/dL (ref 8–23)
CO2: 27 mmol/L (ref 22–32)
Calcium: 10.1 mg/dL (ref 8.9–10.3)
Chloride: 105 mmol/L (ref 98–111)
Creatinine, Ser: 1.1 mg/dL — ABNORMAL HIGH (ref 0.44–1.00)
GFR, Estimated: 51 mL/min — ABNORMAL LOW (ref >60)
Glucose, Bld: 118 mg/dL — ABNORMAL HIGH (ref 70–99)
Potassium: 4.9 mmol/L (ref 3.5–5.1)
Sodium: 141 mmol/L (ref 135–145)
Total Bilirubin: 1.2 mg/dL (ref 0.3–1.2)
Total Protein: 7 g/dL (ref 6.5–8.1)

## 2021-05-13 LAB — VITAMIN D 25 HYDROXY (VIT D DEFICIENCY, FRACTURES): Vit D, 25-Hydroxy: 30.88 ng/mL (ref 30–100)

## 2021-05-13 MED ORDER — ZOLEDRONIC ACID 4 MG/100ML IV SOLN
4.0000 mg | Freq: Once | INTRAVENOUS | Status: AC
  Administered 2021-05-13: 4 mg via INTRAVENOUS
  Filled 2021-05-13: qty 100

## 2021-05-13 MED ORDER — SODIUM CHLORIDE 0.9 % IV SOLN
Freq: Once | INTRAVENOUS | Status: AC
  Filled 2021-05-13: qty 250

## 2021-05-13 MED ORDER — AZITHROMYCIN 250 MG PO TABS: ORAL_TABLET | ORAL | 3 refills | Status: DC

## 2021-05-13 NOTE — Telephone Encounter (Signed)
Per 05/13/21 los - called and lvm of upcoming appointments - mailed calendar

## 2021-05-13 NOTE — Progress Notes (Signed)
Hematology and Oncology Follow Up Visit  Jennifer Yang [id number] 03-09-1941 80 y.o. 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 is here today with her husband for follow-up. She is ding quite well but strained her right arm while throwing a ball with her grandson. This has exacerbated her lymphedema in that arm. She has been wearing her compression sleeve at night and not during the day. We will have her switch to wearing during the day and not at night to see if this helps. She has seen the lymphedema clinic in the past and does not feel that she needs to go back to see them yet. She plans to follow-up with her orthopedist in case she has irritated her bursitis in that shoulder.  No adenopathy noted.  She states that she had her mammogram in March and this was negative.  Right mastectomy intact, left breast exam negative. No mass, lesion or rash noted.  No fever, chills, n/v, cough, rash, dizziness, SOB, chest pain, palpitations, abdominal pain or changes in bowel or bladder habits.  She has swelling in her right ring finger since her fall over a year ago and hitting her elbow.  No falls or syncope to report.  She has maintained a good appetite and is doing her best to stay well hydrated. Her weight is stable at 187 lbs.   ECOG Performance Status: 1 - Symptomatic but completely ambulatory  Medications:  Allergies as of 05/13/2021       Reactions   Codeine Sulfate Swelling   Fish-derived Products Other (See Comments)   Cellulitis    Rocephin [ceftriaxone Sodium] Other (See Comments)   Low blood count   Sulfa Antibiotics Other (See Comments)   Mouth and lip sores        Medication List        Accurate as of May 13, 2021  9:49 AM. If you have any  questions, ask your nurse or doctor.          ALPRAZolam 0.5 MG tablet Commonly known as: XANAX Take 0.5 mg by mouth daily as needed for anxiety.   amLODipine 5 MG tablet Commonly known as: NORVASC Take 5 mg by mouth daily.   amoxicillin 500 MG tablet Commonly known as: AMOXIL amoxicillin 500 mg tablet  Take 4 tablets 1 hour prior to dental appt.   aspirin 81 MG EC tablet aspirin 81 mg tablet,delayed release  Take 1 tablet every day by oral route.   azithromycin 250 MG tablet Commonly known as: Zithromax Z-Pak Take as directed   Caltrate 600+D Plus Minerals 600-800 MG-UNIT Chew Chew 1 tablet by mouth daily.   Cozaar 100 MG tablet Generic drug: losartan Take 100 mg by mouth daily.   fluticasone 50 MCG/ACT nasal spray Commonly known as: FLONASE Place 1-2 sprays into both nostrils daily as needed for allergies or rhinitis.   Fluzone High-Dose Quadrivalent 0.7 ML Susy Generic drug: Influenza Vac High-Dose Quad Fluzone High-Dose Quad 2020-21 (PF) 240 mcg/0.7 mL IM syringe  PHARMACY ADMINISTERED   meclizine 25 MG tablet Commonly known as: ANTIVERT Take 25 mg by mouth 3 (three) times daily as needed for dizziness.   nitrofurantoin 100 MG capsule Commonly known as:  MACRODANTIN nitrofurantoin macrocrystal 100 mg capsule   prednisoLONE acetate 1 % ophthalmic suspension Commonly known as: PRED FORTE prednisolone acetate 1 % eye drops,suspension  INSTILL ONE DROP INTO AFFECTED EYE(S) FOUR TIMES EVERY DAY   rosuvastatin 5 MG tablet Commonly known as: CRESTOR Take 5 mg by mouth daily.   Shingrix injection Generic drug: Zoster Vaccine Adjuvanted Shingrix (PF) 50 mcg/0.5 mL intramuscular suspension, kit  TO BE ADMINISTERED BY PHARMACIST FOR IMMUNIZATION   ZOMETA IV Inject 1 each into the skin See admin instructions. One a year        Allergies:  Allergies  Allergen Reactions   Codeine Sulfate Swelling   Fish-Derived Products Other (See Comments)     Cellulitis    Rocephin [Ceftriaxone Sodium] Other (See Comments)    Low blood count   Sulfa Antibiotics Other (See Comments)    Mouth and lip sores    Past Medical History, Surgical history, Social history, and Family History were reviewed and updated.  Review of Systems: All other 10 point review of systems is negative.   Physical Exam:  weight is 187 lb 0.6 oz (84.8 kg). Her oral temperature is 97.7 F (36.5 C). Her blood pressure is 141/71 (abnormal) and her pulse is 55 (abnormal). Her respiration is 16 and oxygen saturation is 100%.   Wt Readings from Last 3 Encounters:  05/13/21 187 lb 0.6 oz (84.8 kg)  05/10/20 193 lb 12.8 oz (87.9 kg)  06/20/19 193 lb (87.5 kg)    Ocular: Sclerae unicteric, pupils equal, round and reactive to light Ear-nose-throat: Oropharynx clear, dentition fair Lymphatic: No cervical, supraclavicular  or axillary adenopathy Lungs no rales or rhonchi, good excursion bilaterally Heart regular rate and rhythm, no murmur appreciated Abd soft, nontender, positive bowel sounds MSK no focal spinal tenderness, no joint edema Neuro: non-focal, well-oriented, appropriate affect Breasts: No mass, lesion or rash noted. Right mastectomy intact, left breast exam negative.   Lab Results  Component Value Date   WBC 5.9 05/13/2021   HGB 12.7 05/13/2021   HCT 40.3 05/13/2021   MCV 92.9 05/13/2021   PLT 160 05/13/2021   No results found for: FERRITIN, IRON, TIBC, UIBC, IRONPCTSAT Lab Results  Component Value Date   RBC 4.34 05/13/2021   No results found for: KPAFRELGTCHN, LAMBDASER, KAPLAMBRATIO No results found for: IGGSERUM, IGA, IGMSERUM No results found for: Odetta Pink, SPEI   Chemistry      Component Value Date/Time   NA 140 05/10/2020 0747   NA 146 (H) 11/09/2017 1135   NA 144 05/24/2013 0959   K 4.3 05/10/2020 0747   K 3.9 11/09/2017 1135   K 3.9 05/24/2013 0959   CL 105 05/10/2020 0747    CL 104 11/09/2017 1135   CO2 28 05/10/2020 0747   CO2 28 11/09/2017 1135   CO2 27 05/24/2013 0959   BUN 27 (H) 05/10/2020 0747   BUN 23 (H) 11/09/2017 1135   BUN 19.7 05/24/2013 0959   CREATININE 1.21 (H) 05/10/2020 0747   CREATININE 1.1 11/09/2017 1135   CREATININE 1.1 05/24/2013 0959      Component Value Date/Time   CALCIUM 9.8 05/10/2020 0747   CALCIUM 9.4 11/09/2017 1135   CALCIUM 9.8 05/24/2013 0959   ALKPHOS 35 (L) 05/10/2020 0747   ALKPHOS 57 11/09/2017 1135   AST 14 (L) 05/10/2020 0747   ALT 13 05/10/2020 0747   ALT 23 11/09/2017 1135   BILITOT 1.2 05/10/2020 0747  Impression and Plan: Ms. Hightower is a very pleasant 80 yo caucasian female with history of 2 separate primary breast cancers, both stage 1 and ER positive. She is doing well and so far there has been no evidence of recurrence.  She strained her right arm playing call with her grandson and will try wearing her compression sleeve during the day. She will let us know is this persists or worsens. She also plans to follow-up with hr orthopedist.  She received Zometa today as planned.  Follow-up in 1 year.  She can contact our office with any questions or concerns.   Laverna Peace, NP 6/27/20229:49 AM

## 2021-05-13 NOTE — Patient Instructions (Signed)

## 2021-05-16 DIAGNOSIS — E1121 Type 2 diabetes mellitus with diabetic nephropathy: Secondary | ICD-10-CM | POA: Diagnosis not present

## 2021-05-16 DIAGNOSIS — E538 Deficiency of other specified B group vitamins: Secondary | ICD-10-CM | POA: Diagnosis not present

## 2021-05-16 DIAGNOSIS — E785 Hyperlipidemia, unspecified: Secondary | ICD-10-CM | POA: Diagnosis not present

## 2021-05-16 DIAGNOSIS — I129 Hypertensive chronic kidney disease with stage 1 through stage 4 chronic kidney disease, or unspecified chronic kidney disease: Secondary | ICD-10-CM | POA: Diagnosis not present

## 2021-05-16 DIAGNOSIS — E559 Vitamin D deficiency, unspecified: Secondary | ICD-10-CM | POA: Diagnosis not present

## 2021-05-16 DIAGNOSIS — E119 Type 2 diabetes mellitus without complications: Secondary | ICD-10-CM | POA: Diagnosis not present

## 2021-06-13 DIAGNOSIS — K635 Polyp of colon: Secondary | ICD-10-CM | POA: Diagnosis not present

## 2021-06-13 DIAGNOSIS — K5730 Diverticulosis of large intestine without perforation or abscess without bleeding: Secondary | ICD-10-CM | POA: Diagnosis not present

## 2021-06-13 DIAGNOSIS — D125 Benign neoplasm of sigmoid colon: Secondary | ICD-10-CM | POA: Diagnosis not present

## 2021-06-13 DIAGNOSIS — D120 Benign neoplasm of cecum: Secondary | ICD-10-CM | POA: Diagnosis not present

## 2021-06-13 DIAGNOSIS — D123 Benign neoplasm of transverse colon: Secondary | ICD-10-CM | POA: Diagnosis not present

## 2021-06-13 DIAGNOSIS — D122 Benign neoplasm of ascending colon: Secondary | ICD-10-CM | POA: Diagnosis not present

## 2021-06-13 DIAGNOSIS — Z86010 Personal history of colonic polyps: Secondary | ICD-10-CM | POA: Diagnosis not present

## 2021-06-16 DIAGNOSIS — E1122 Type 2 diabetes mellitus with diabetic chronic kidney disease: Secondary | ICD-10-CM | POA: Diagnosis not present

## 2021-06-16 DIAGNOSIS — I129 Hypertensive chronic kidney disease with stage 1 through stage 4 chronic kidney disease, or unspecified chronic kidney disease: Secondary | ICD-10-CM | POA: Diagnosis not present

## 2021-06-16 DIAGNOSIS — N1831 Chronic kidney disease, stage 3a: Secondary | ICD-10-CM | POA: Diagnosis not present

## 2021-06-16 DIAGNOSIS — E785 Hyperlipidemia, unspecified: Secondary | ICD-10-CM | POA: Diagnosis not present

## 2021-06-26 DIAGNOSIS — Z20822 Contact with and (suspected) exposure to covid-19: Secondary | ICD-10-CM | POA: Diagnosis not present

## 2021-07-03 DIAGNOSIS — H6502 Acute serous otitis media, left ear: Secondary | ICD-10-CM | POA: Diagnosis not present

## 2021-07-03 DIAGNOSIS — R0981 Nasal congestion: Secondary | ICD-10-CM | POA: Diagnosis not present

## 2021-07-03 DIAGNOSIS — R42 Dizziness and giddiness: Secondary | ICD-10-CM | POA: Diagnosis not present

## 2021-07-04 DIAGNOSIS — L603 Nail dystrophy: Secondary | ICD-10-CM | POA: Diagnosis not present

## 2021-07-13 DIAGNOSIS — R0902 Hypoxemia: Secondary | ICD-10-CM | POA: Diagnosis not present

## 2021-07-13 DIAGNOSIS — S8002XA Contusion of left knee, initial encounter: Secondary | ICD-10-CM | POA: Diagnosis not present

## 2021-07-13 DIAGNOSIS — R52 Pain, unspecified: Secondary | ICD-10-CM | POA: Diagnosis not present

## 2021-07-13 DIAGNOSIS — W1830XA Fall on same level, unspecified, initial encounter: Secondary | ICD-10-CM | POA: Diagnosis not present

## 2021-07-13 DIAGNOSIS — I10 Essential (primary) hypertension: Secondary | ICD-10-CM | POA: Diagnosis not present

## 2021-07-13 DIAGNOSIS — S72012A Unspecified intracapsular fracture of left femur, initial encounter for closed fracture: Secondary | ICD-10-CM | POA: Diagnosis not present

## 2021-07-13 DIAGNOSIS — N390 Urinary tract infection, site not specified: Secondary | ICD-10-CM | POA: Diagnosis not present

## 2021-07-13 DIAGNOSIS — S72002A Fracture of unspecified part of neck of left femur, initial encounter for closed fracture: Secondary | ICD-10-CM | POA: Diagnosis not present

## 2021-07-13 DIAGNOSIS — S8992XA Unspecified injury of left lower leg, initial encounter: Secondary | ICD-10-CM | POA: Diagnosis not present

## 2021-07-13 DIAGNOSIS — S72042A Displaced fracture of base of neck of left femur, initial encounter for closed fracture: Secondary | ICD-10-CM | POA: Diagnosis not present

## 2021-07-13 DIAGNOSIS — S299XXA Unspecified injury of thorax, initial encounter: Secondary | ICD-10-CM | POA: Diagnosis not present

## 2021-07-14 DIAGNOSIS — S72002A Fracture of unspecified part of neck of left femur, initial encounter for closed fracture: Secondary | ICD-10-CM | POA: Diagnosis not present

## 2021-07-14 DIAGNOSIS — M84352A Stress fracture, left femur, initial encounter for fracture: Secondary | ICD-10-CM | POA: Diagnosis not present

## 2021-07-15 DIAGNOSIS — N390 Urinary tract infection, site not specified: Secondary | ICD-10-CM | POA: Diagnosis not present

## 2021-07-15 DIAGNOSIS — S72002A Fracture of unspecified part of neck of left femur, initial encounter for closed fracture: Secondary | ICD-10-CM | POA: Diagnosis not present

## 2021-07-15 DIAGNOSIS — S72012A Unspecified intracapsular fracture of left femur, initial encounter for closed fracture: Secondary | ICD-10-CM | POA: Diagnosis not present

## 2021-07-15 DIAGNOSIS — Z01810 Encounter for preprocedural cardiovascular examination: Secondary | ICD-10-CM | POA: Diagnosis not present

## 2021-07-15 DIAGNOSIS — I4891 Unspecified atrial fibrillation: Secondary | ICD-10-CM | POA: Diagnosis not present

## 2021-07-15 DIAGNOSIS — M84352A Stress fracture, left femur, initial encounter for fracture: Secondary | ICD-10-CM | POA: Diagnosis not present

## 2021-07-15 DIAGNOSIS — M1612 Unilateral primary osteoarthritis, left hip: Secondary | ICD-10-CM | POA: Diagnosis not present

## 2021-07-15 DIAGNOSIS — I10 Essential (primary) hypertension: Secondary | ICD-10-CM | POA: Diagnosis not present

## 2021-07-16 DIAGNOSIS — I4891 Unspecified atrial fibrillation: Secondary | ICD-10-CM | POA: Diagnosis not present

## 2021-07-16 DIAGNOSIS — Z01810 Encounter for preprocedural cardiovascular examination: Secondary | ICD-10-CM | POA: Diagnosis not present

## 2021-07-16 DIAGNOSIS — M84352A Stress fracture, left femur, initial encounter for fracture: Secondary | ICD-10-CM | POA: Diagnosis not present

## 2021-07-16 DIAGNOSIS — N390 Urinary tract infection, site not specified: Secondary | ICD-10-CM | POA: Diagnosis not present

## 2021-07-16 DIAGNOSIS — S72002A Fracture of unspecified part of neck of left femur, initial encounter for closed fracture: Secondary | ICD-10-CM | POA: Diagnosis not present

## 2021-07-17 DIAGNOSIS — S72002A Fracture of unspecified part of neck of left femur, initial encounter for closed fracture: Secondary | ICD-10-CM | POA: Diagnosis not present

## 2021-07-17 DIAGNOSIS — N390 Urinary tract infection, site not specified: Secondary | ICD-10-CM | POA: Diagnosis not present

## 2021-07-17 DIAGNOSIS — K920 Hematemesis: Secondary | ICD-10-CM | POA: Diagnosis not present

## 2021-07-17 DIAGNOSIS — I129 Hypertensive chronic kidney disease with stage 1 through stage 4 chronic kidney disease, or unspecified chronic kidney disease: Secondary | ICD-10-CM | POA: Diagnosis not present

## 2021-07-17 DIAGNOSIS — E1122 Type 2 diabetes mellitus with diabetic chronic kidney disease: Secondary | ICD-10-CM | POA: Diagnosis not present

## 2021-07-17 DIAGNOSIS — E785 Hyperlipidemia, unspecified: Secondary | ICD-10-CM | POA: Diagnosis not present

## 2021-07-17 DIAGNOSIS — N1831 Chronic kidney disease, stage 3a: Secondary | ICD-10-CM | POA: Diagnosis not present

## 2021-07-17 DIAGNOSIS — I4891 Unspecified atrial fibrillation: Secondary | ICD-10-CM | POA: Diagnosis not present

## 2021-07-18 DIAGNOSIS — K920 Hematemesis: Secondary | ICD-10-CM | POA: Diagnosis not present

## 2021-07-18 DIAGNOSIS — N390 Urinary tract infection, site not specified: Secondary | ICD-10-CM | POA: Diagnosis not present

## 2021-07-18 DIAGNOSIS — I4891 Unspecified atrial fibrillation: Secondary | ICD-10-CM | POA: Diagnosis not present

## 2021-07-18 DIAGNOSIS — L603 Nail dystrophy: Secondary | ICD-10-CM | POA: Diagnosis not present

## 2021-07-18 DIAGNOSIS — S72002A Fracture of unspecified part of neck of left femur, initial encounter for closed fracture: Secondary | ICD-10-CM | POA: Diagnosis not present

## 2021-07-19 DIAGNOSIS — N390 Urinary tract infection, site not specified: Secondary | ICD-10-CM | POA: Diagnosis not present

## 2021-07-19 DIAGNOSIS — E785 Hyperlipidemia, unspecified: Secondary | ICD-10-CM | POA: Diagnosis present

## 2021-07-19 DIAGNOSIS — R262 Difficulty in walking, not elsewhere classified: Secondary | ICD-10-CM | POA: Diagnosis not present

## 2021-07-19 DIAGNOSIS — Z79899 Other long term (current) drug therapy: Secondary | ICD-10-CM | POA: Diagnosis not present

## 2021-07-19 DIAGNOSIS — I10 Essential (primary) hypertension: Secondary | ICD-10-CM | POA: Diagnosis not present

## 2021-07-19 DIAGNOSIS — Z7982 Long term (current) use of aspirin: Secondary | ICD-10-CM | POA: Diagnosis not present

## 2021-07-19 DIAGNOSIS — N3000 Acute cystitis without hematuria: Secondary | ICD-10-CM | POA: Diagnosis not present

## 2021-07-19 DIAGNOSIS — D649 Anemia, unspecified: Secondary | ICD-10-CM | POA: Diagnosis present

## 2021-07-19 DIAGNOSIS — Z20822 Contact with and (suspected) exposure to covid-19: Secondary | ICD-10-CM | POA: Diagnosis not present

## 2021-07-19 DIAGNOSIS — Z853 Personal history of malignant neoplasm of breast: Secondary | ICD-10-CM | POA: Diagnosis not present

## 2021-07-19 DIAGNOSIS — S72002D Fracture of unspecified part of neck of left femur, subsequent encounter for closed fracture with routine healing: Secondary | ICD-10-CM | POA: Diagnosis not present

## 2021-07-19 DIAGNOSIS — Z882 Allergy status to sulfonamides status: Secondary | ICD-10-CM | POA: Diagnosis not present

## 2021-07-19 DIAGNOSIS — Z96641 Presence of right artificial hip joint: Secondary | ICD-10-CM | POA: Diagnosis not present

## 2021-07-19 DIAGNOSIS — Z9011 Acquired absence of right breast and nipple: Secondary | ICD-10-CM | POA: Diagnosis not present

## 2021-07-19 DIAGNOSIS — I4891 Unspecified atrial fibrillation: Secondary | ICD-10-CM | POA: Diagnosis present

## 2021-07-19 DIAGNOSIS — G8918 Other acute postprocedural pain: Secondary | ICD-10-CM | POA: Diagnosis not present

## 2021-07-19 DIAGNOSIS — S72002A Fracture of unspecified part of neck of left femur, initial encounter for closed fracture: Secondary | ICD-10-CM | POA: Diagnosis not present

## 2021-07-19 DIAGNOSIS — R2689 Other abnormalities of gait and mobility: Secondary | ICD-10-CM | POA: Diagnosis not present

## 2021-07-19 DIAGNOSIS — Z885 Allergy status to narcotic agent status: Secondary | ICD-10-CM | POA: Diagnosis not present

## 2021-07-19 DIAGNOSIS — R252 Cramp and spasm: Secondary | ICD-10-CM | POA: Diagnosis not present

## 2021-07-19 DIAGNOSIS — Z7409 Other reduced mobility: Secondary | ICD-10-CM | POA: Diagnosis not present

## 2021-07-19 DIAGNOSIS — Z91013 Allergy to seafood: Secondary | ICD-10-CM | POA: Diagnosis not present

## 2021-07-19 DIAGNOSIS — Z881 Allergy status to other antibiotic agents status: Secondary | ICD-10-CM | POA: Diagnosis not present

## 2021-07-19 DIAGNOSIS — K920 Hematemesis: Secondary | ICD-10-CM | POA: Diagnosis not present

## 2021-07-19 DIAGNOSIS — M1990 Unspecified osteoarthritis, unspecified site: Secondary | ICD-10-CM | POA: Diagnosis present

## 2021-07-19 DIAGNOSIS — Z96643 Presence of artificial hip joint, bilateral: Secondary | ICD-10-CM | POA: Diagnosis present

## 2021-07-19 DIAGNOSIS — F419 Anxiety disorder, unspecified: Secondary | ICD-10-CM | POA: Diagnosis present

## 2021-07-19 DIAGNOSIS — M25562 Pain in left knee: Secondary | ICD-10-CM | POA: Diagnosis not present

## 2021-07-31 DIAGNOSIS — Z20822 Contact with and (suspected) exposure to covid-19: Secondary | ICD-10-CM | POA: Diagnosis not present

## 2021-08-02 DIAGNOSIS — Z96642 Presence of left artificial hip joint: Secondary | ICD-10-CM | POA: Diagnosis not present

## 2021-08-02 DIAGNOSIS — Z7982 Long term (current) use of aspirin: Secondary | ICD-10-CM | POA: Diagnosis not present

## 2021-08-02 DIAGNOSIS — E785 Hyperlipidemia, unspecified: Secondary | ICD-10-CM | POA: Diagnosis not present

## 2021-08-02 DIAGNOSIS — D649 Anemia, unspecified: Secondary | ICD-10-CM | POA: Diagnosis not present

## 2021-08-02 DIAGNOSIS — M1712 Unilateral primary osteoarthritis, left knee: Secondary | ICD-10-CM | POA: Diagnosis not present

## 2021-08-02 DIAGNOSIS — E119 Type 2 diabetes mellitus without complications: Secondary | ICD-10-CM | POA: Diagnosis not present

## 2021-08-02 DIAGNOSIS — Z9181 History of falling: Secondary | ICD-10-CM | POA: Diagnosis not present

## 2021-08-02 DIAGNOSIS — F419 Anxiety disorder, unspecified: Secondary | ICD-10-CM | POA: Diagnosis not present

## 2021-08-02 DIAGNOSIS — I480 Paroxysmal atrial fibrillation: Secondary | ICD-10-CM | POA: Diagnosis not present

## 2021-08-02 DIAGNOSIS — K5900 Constipation, unspecified: Secondary | ICD-10-CM | POA: Diagnosis not present

## 2021-08-02 DIAGNOSIS — M80052D Age-related osteoporosis with current pathological fracture, left femur, subsequent encounter for fracture with routine healing: Secondary | ICD-10-CM | POA: Diagnosis not present

## 2021-08-02 DIAGNOSIS — I10 Essential (primary) hypertension: Secondary | ICD-10-CM | POA: Diagnosis not present

## 2021-08-02 DIAGNOSIS — E538 Deficiency of other specified B group vitamins: Secondary | ICD-10-CM | POA: Diagnosis not present

## 2021-08-02 DIAGNOSIS — I700 Atherosclerosis of aorta: Secondary | ICD-10-CM | POA: Diagnosis not present

## 2021-08-02 DIAGNOSIS — I058 Other rheumatic mitral valve diseases: Secondary | ICD-10-CM | POA: Diagnosis not present

## 2021-08-02 DIAGNOSIS — W19XXXD Unspecified fall, subsequent encounter: Secondary | ICD-10-CM | POA: Diagnosis not present

## 2021-08-02 DIAGNOSIS — I890 Lymphedema, not elsewhere classified: Secondary | ICD-10-CM | POA: Diagnosis not present

## 2021-08-02 DIAGNOSIS — K922 Gastrointestinal hemorrhage, unspecified: Secondary | ICD-10-CM | POA: Diagnosis not present

## 2021-08-02 DIAGNOSIS — Z853 Personal history of malignant neoplasm of breast: Secondary | ICD-10-CM | POA: Diagnosis not present

## 2021-08-05 DIAGNOSIS — D649 Anemia, unspecified: Secondary | ICD-10-CM | POA: Diagnosis not present

## 2021-08-05 DIAGNOSIS — Z4889 Encounter for other specified surgical aftercare: Secondary | ICD-10-CM | POA: Diagnosis not present

## 2021-08-05 DIAGNOSIS — E119 Type 2 diabetes mellitus without complications: Secondary | ICD-10-CM | POA: Diagnosis not present

## 2021-08-05 DIAGNOSIS — Z96641 Presence of right artificial hip joint: Secondary | ICD-10-CM | POA: Diagnosis not present

## 2021-08-05 DIAGNOSIS — I480 Paroxysmal atrial fibrillation: Secondary | ICD-10-CM | POA: Diagnosis not present

## 2021-08-05 DIAGNOSIS — M80052D Age-related osteoporosis with current pathological fracture, left femur, subsequent encounter for fracture with routine healing: Secondary | ICD-10-CM | POA: Diagnosis not present

## 2021-08-05 DIAGNOSIS — Z96649 Presence of unspecified artificial hip joint: Secondary | ICD-10-CM | POA: Diagnosis not present

## 2021-08-05 DIAGNOSIS — I10 Essential (primary) hypertension: Secondary | ICD-10-CM | POA: Diagnosis not present

## 2021-08-05 DIAGNOSIS — M1712 Unilateral primary osteoarthritis, left knee: Secondary | ICD-10-CM | POA: Diagnosis not present

## 2021-08-06 DIAGNOSIS — R001 Bradycardia, unspecified: Secondary | ICD-10-CM | POA: Diagnosis not present

## 2021-08-06 DIAGNOSIS — R350 Frequency of micturition: Secondary | ICD-10-CM | POA: Diagnosis not present

## 2021-08-06 DIAGNOSIS — F321 Major depressive disorder, single episode, moderate: Secondary | ICD-10-CM | POA: Diagnosis not present

## 2021-08-06 DIAGNOSIS — K922 Gastrointestinal hemorrhage, unspecified: Secondary | ICD-10-CM | POA: Diagnosis not present

## 2021-08-06 DIAGNOSIS — I4891 Unspecified atrial fibrillation: Secondary | ICD-10-CM | POA: Diagnosis not present

## 2021-08-06 DIAGNOSIS — I129 Hypertensive chronic kidney disease with stage 1 through stage 4 chronic kidney disease, or unspecified chronic kidney disease: Secondary | ICD-10-CM | POA: Diagnosis not present

## 2021-08-07 DIAGNOSIS — I10 Essential (primary) hypertension: Secondary | ICD-10-CM | POA: Diagnosis not present

## 2021-08-07 DIAGNOSIS — E119 Type 2 diabetes mellitus without complications: Secondary | ICD-10-CM | POA: Diagnosis not present

## 2021-08-07 DIAGNOSIS — M80052D Age-related osteoporosis with current pathological fracture, left femur, subsequent encounter for fracture with routine healing: Secondary | ICD-10-CM | POA: Diagnosis not present

## 2021-08-07 DIAGNOSIS — I480 Paroxysmal atrial fibrillation: Secondary | ICD-10-CM | POA: Diagnosis not present

## 2021-08-07 DIAGNOSIS — M1712 Unilateral primary osteoarthritis, left knee: Secondary | ICD-10-CM | POA: Diagnosis not present

## 2021-08-07 DIAGNOSIS — D649 Anemia, unspecified: Secondary | ICD-10-CM | POA: Diagnosis not present

## 2021-08-08 DIAGNOSIS — I480 Paroxysmal atrial fibrillation: Secondary | ICD-10-CM | POA: Diagnosis not present

## 2021-08-08 DIAGNOSIS — M80052D Age-related osteoporosis with current pathological fracture, left femur, subsequent encounter for fracture with routine healing: Secondary | ICD-10-CM | POA: Diagnosis not present

## 2021-08-08 DIAGNOSIS — D649 Anemia, unspecified: Secondary | ICD-10-CM | POA: Diagnosis not present

## 2021-08-08 DIAGNOSIS — I10 Essential (primary) hypertension: Secondary | ICD-10-CM | POA: Diagnosis not present

## 2021-08-08 DIAGNOSIS — N3000 Acute cystitis without hematuria: Secondary | ICD-10-CM | POA: Diagnosis not present

## 2021-08-08 DIAGNOSIS — K922 Gastrointestinal hemorrhage, unspecified: Secondary | ICD-10-CM | POA: Diagnosis not present

## 2021-08-08 DIAGNOSIS — M1712 Unilateral primary osteoarthritis, left knee: Secondary | ICD-10-CM | POA: Diagnosis not present

## 2021-08-08 DIAGNOSIS — I129 Hypertensive chronic kidney disease with stage 1 through stage 4 chronic kidney disease, or unspecified chronic kidney disease: Secondary | ICD-10-CM | POA: Diagnosis not present

## 2021-08-08 DIAGNOSIS — Z09 Encounter for follow-up examination after completed treatment for conditions other than malignant neoplasm: Secondary | ICD-10-CM | POA: Diagnosis not present

## 2021-08-08 DIAGNOSIS — E785 Hyperlipidemia, unspecified: Secondary | ICD-10-CM | POA: Diagnosis not present

## 2021-08-08 DIAGNOSIS — E119 Type 2 diabetes mellitus without complications: Secondary | ICD-10-CM | POA: Diagnosis not present

## 2021-08-08 DIAGNOSIS — M810 Age-related osteoporosis without current pathological fracture: Secondary | ICD-10-CM | POA: Diagnosis not present

## 2021-08-08 DIAGNOSIS — Z87440 Personal history of urinary (tract) infections: Secondary | ICD-10-CM | POA: Diagnosis not present

## 2021-08-08 DIAGNOSIS — E1121 Type 2 diabetes mellitus with diabetic nephropathy: Secondary | ICD-10-CM | POA: Diagnosis not present

## 2021-08-09 DIAGNOSIS — M1712 Unilateral primary osteoarthritis, left knee: Secondary | ICD-10-CM | POA: Diagnosis not present

## 2021-08-09 DIAGNOSIS — E119 Type 2 diabetes mellitus without complications: Secondary | ICD-10-CM | POA: Diagnosis not present

## 2021-08-09 DIAGNOSIS — I480 Paroxysmal atrial fibrillation: Secondary | ICD-10-CM | POA: Diagnosis not present

## 2021-08-09 DIAGNOSIS — D649 Anemia, unspecified: Secondary | ICD-10-CM | POA: Diagnosis not present

## 2021-08-09 DIAGNOSIS — M80052D Age-related osteoporosis with current pathological fracture, left femur, subsequent encounter for fracture with routine healing: Secondary | ICD-10-CM | POA: Diagnosis not present

## 2021-08-09 DIAGNOSIS — I10 Essential (primary) hypertension: Secondary | ICD-10-CM | POA: Diagnosis not present

## 2021-08-12 DIAGNOSIS — D649 Anemia, unspecified: Secondary | ICD-10-CM | POA: Diagnosis not present

## 2021-08-12 DIAGNOSIS — I10 Essential (primary) hypertension: Secondary | ICD-10-CM | POA: Diagnosis not present

## 2021-08-12 DIAGNOSIS — E119 Type 2 diabetes mellitus without complications: Secondary | ICD-10-CM | POA: Diagnosis not present

## 2021-08-12 DIAGNOSIS — M80052D Age-related osteoporosis with current pathological fracture, left femur, subsequent encounter for fracture with routine healing: Secondary | ICD-10-CM | POA: Diagnosis not present

## 2021-08-12 DIAGNOSIS — M1712 Unilateral primary osteoarthritis, left knee: Secondary | ICD-10-CM | POA: Diagnosis not present

## 2021-08-12 DIAGNOSIS — I480 Paroxysmal atrial fibrillation: Secondary | ICD-10-CM | POA: Diagnosis not present

## 2021-08-13 DIAGNOSIS — I480 Paroxysmal atrial fibrillation: Secondary | ICD-10-CM | POA: Diagnosis not present

## 2021-08-13 DIAGNOSIS — M80052D Age-related osteoporosis with current pathological fracture, left femur, subsequent encounter for fracture with routine healing: Secondary | ICD-10-CM | POA: Diagnosis not present

## 2021-08-13 DIAGNOSIS — M1712 Unilateral primary osteoarthritis, left knee: Secondary | ICD-10-CM | POA: Diagnosis not present

## 2021-08-13 DIAGNOSIS — E119 Type 2 diabetes mellitus without complications: Secondary | ICD-10-CM | POA: Diagnosis not present

## 2021-08-13 DIAGNOSIS — I10 Essential (primary) hypertension: Secondary | ICD-10-CM | POA: Diagnosis not present

## 2021-08-13 DIAGNOSIS — D649 Anemia, unspecified: Secondary | ICD-10-CM | POA: Diagnosis not present

## 2021-08-14 ENCOUNTER — Other Ambulatory Visit: Payer: Self-pay | Admitting: Gastroenterology

## 2021-08-14 DIAGNOSIS — K920 Hematemesis: Secondary | ICD-10-CM | POA: Diagnosis not present

## 2021-08-14 DIAGNOSIS — I4819 Other persistent atrial fibrillation: Secondary | ICD-10-CM | POA: Diagnosis not present

## 2021-08-14 DIAGNOSIS — D509 Iron deficiency anemia, unspecified: Secondary | ICD-10-CM | POA: Diagnosis not present

## 2021-08-15 DIAGNOSIS — M80052D Age-related osteoporosis with current pathological fracture, left femur, subsequent encounter for fracture with routine healing: Secondary | ICD-10-CM | POA: Diagnosis not present

## 2021-08-15 DIAGNOSIS — D649 Anemia, unspecified: Secondary | ICD-10-CM | POA: Diagnosis not present

## 2021-08-15 DIAGNOSIS — E119 Type 2 diabetes mellitus without complications: Secondary | ICD-10-CM | POA: Diagnosis not present

## 2021-08-15 DIAGNOSIS — I480 Paroxysmal atrial fibrillation: Secondary | ICD-10-CM | POA: Diagnosis not present

## 2021-08-15 DIAGNOSIS — M1712 Unilateral primary osteoarthritis, left knee: Secondary | ICD-10-CM | POA: Diagnosis not present

## 2021-08-15 DIAGNOSIS — I10 Essential (primary) hypertension: Secondary | ICD-10-CM | POA: Diagnosis not present

## 2021-08-16 DIAGNOSIS — E785 Hyperlipidemia, unspecified: Secondary | ICD-10-CM | POA: Diagnosis not present

## 2021-08-16 DIAGNOSIS — E1122 Type 2 diabetes mellitus with diabetic chronic kidney disease: Secondary | ICD-10-CM | POA: Diagnosis not present

## 2021-08-16 DIAGNOSIS — N1831 Chronic kidney disease, stage 3a: Secondary | ICD-10-CM | POA: Diagnosis not present

## 2021-08-16 DIAGNOSIS — I129 Hypertensive chronic kidney disease with stage 1 through stage 4 chronic kidney disease, or unspecified chronic kidney disease: Secondary | ICD-10-CM | POA: Diagnosis not present

## 2021-08-19 DIAGNOSIS — I480 Paroxysmal atrial fibrillation: Secondary | ICD-10-CM | POA: Diagnosis not present

## 2021-08-19 DIAGNOSIS — I10 Essential (primary) hypertension: Secondary | ICD-10-CM | POA: Diagnosis not present

## 2021-08-19 DIAGNOSIS — M1712 Unilateral primary osteoarthritis, left knee: Secondary | ICD-10-CM | POA: Diagnosis not present

## 2021-08-19 DIAGNOSIS — E119 Type 2 diabetes mellitus without complications: Secondary | ICD-10-CM | POA: Diagnosis not present

## 2021-08-19 DIAGNOSIS — M80052D Age-related osteoporosis with current pathological fracture, left femur, subsequent encounter for fracture with routine healing: Secondary | ICD-10-CM | POA: Diagnosis not present

## 2021-08-19 DIAGNOSIS — D649 Anemia, unspecified: Secondary | ICD-10-CM | POA: Diagnosis not present

## 2021-08-20 ENCOUNTER — Ambulatory Visit (HOSPITAL_COMMUNITY): Payer: Medicare Other | Admitting: Anesthesiology

## 2021-08-20 ENCOUNTER — Encounter (HOSPITAL_COMMUNITY)
Admission: RE | Disposition: A | Discharge status: Home / Self Care | Payer: Self-pay | Source: Home / Self Care | Attending: Gastroenterology

## 2021-08-20 ENCOUNTER — Ambulatory Visit (HOSPITAL_COMMUNITY)
Admission: RE | Admit: 2021-08-20 | Discharge: 2021-08-20 | Disposition: A | Discharge status: Home / Self Care | Payer: Medicare Other | Attending: Gastroenterology | Admitting: Gastroenterology

## 2021-08-20 ENCOUNTER — Encounter: Payer: Self-pay | Admitting: Hematology & Oncology

## 2021-08-20 ENCOUNTER — Encounter (HOSPITAL_COMMUNITY): Payer: Self-pay | Admitting: Gastroenterology

## 2021-08-20 ENCOUNTER — Other Ambulatory Visit: Payer: Self-pay

## 2021-08-20 DIAGNOSIS — D509 Iron deficiency anemia, unspecified: Secondary | ICD-10-CM | POA: Diagnosis not present

## 2021-08-20 DIAGNOSIS — I480 Paroxysmal atrial fibrillation: Secondary | ICD-10-CM | POA: Diagnosis not present

## 2021-08-20 DIAGNOSIS — K2101 Gastro-esophageal reflux disease with esophagitis, with bleeding: Secondary | ICD-10-CM | POA: Diagnosis not present

## 2021-08-20 DIAGNOSIS — E785 Hyperlipidemia, unspecified: Secondary | ICD-10-CM | POA: Diagnosis not present

## 2021-08-20 DIAGNOSIS — Z23 Encounter for immunization: Secondary | ICD-10-CM | POA: Diagnosis not present

## 2021-08-20 DIAGNOSIS — K2100 Gastro-esophageal reflux disease with esophagitis, without bleeding: Secondary | ICD-10-CM | POA: Diagnosis not present

## 2021-08-20 DIAGNOSIS — K449 Diaphragmatic hernia without obstruction or gangrene: Secondary | ICD-10-CM | POA: Insufficient documentation

## 2021-08-20 DIAGNOSIS — K922 Gastrointestinal hemorrhage, unspecified: Secondary | ICD-10-CM | POA: Diagnosis not present

## 2021-08-20 DIAGNOSIS — K920 Hematemesis: Secondary | ICD-10-CM | POA: Diagnosis not present

## 2021-08-20 DIAGNOSIS — E119 Type 2 diabetes mellitus without complications: Secondary | ICD-10-CM | POA: Diagnosis not present

## 2021-08-20 DIAGNOSIS — D500 Iron deficiency anemia secondary to blood loss (chronic): Secondary | ICD-10-CM | POA: Diagnosis not present

## 2021-08-20 DIAGNOSIS — I10 Essential (primary) hypertension: Secondary | ICD-10-CM | POA: Diagnosis not present

## 2021-08-20 DIAGNOSIS — E039 Hypothyroidism, unspecified: Secondary | ICD-10-CM | POA: Diagnosis not present

## 2021-08-20 HISTORY — PX: ESOPHAGOGASTRODUODENOSCOPY (EGD) WITH PROPOFOL: SHX5813

## 2021-08-20 SURGERY — ESOPHAGOGASTRODUODENOSCOPY (EGD) WITH PROPOFOL
Anesthesia: Monitor Anesthesia Care

## 2021-08-20 MED ORDER — PROPOFOL 10 MG/ML IV BOLUS
INTRAVENOUS | Status: DC | PRN
  Administered 2021-08-20 (×4): 20 mg via INTRAVENOUS

## 2021-08-20 MED ORDER — LIDOCAINE 2% (20 MG/ML) 5 ML SYRINGE
INTRAMUSCULAR | Status: DC | PRN
Start: 2021-08-20 — End: 2021-08-20
  Administered 2021-08-20: 100 mg via INTRAVENOUS

## 2021-08-20 MED ORDER — PROPOFOL 500 MG/50ML IV EMUL
INTRAVENOUS | Status: DC | PRN
  Administered 2021-08-20: 125 ug/kg/min via INTRAVENOUS

## 2021-08-20 MED ORDER — ONDANSETRON HCL 4 MG/2ML IJ SOLN
INTRAMUSCULAR | Status: DC | PRN
  Administered 2021-08-20: 4 mg via INTRAVENOUS

## 2021-08-20 MED ORDER — LACTATED RINGERS IV SOLN: INTRAVENOUS | Status: DC

## 2021-08-20 SURGICAL SUPPLY — 15 items

## 2021-08-20 NOTE — Anesthesia Preprocedure Evaluation (Addendum)
Anesthesia Evaluation  Patient identified by MRN, date of birth, ID band Patient awake    Reviewed: Allergy & Precautions, H&P , NPO status , Patient's Chart, lab work & pertinent test results  History of Anesthesia Complications (+) PONV and history of anesthetic complications  Airway Mallampati: II  TM Distance: >3 FB Neck ROM: Full    Dental no notable dental hx. (+) Dental Advisory Given   Pulmonary neg pulmonary ROS,    Pulmonary exam normal        Cardiovascular hypertension, Pt. on medications and Pt. on home beta blockers Normal cardiovascular exam  Study Conclusions 2016  - Left ventricle: The cavity size was normal. Systolic function was  normal. The estimated ejection fraction was in the range of 55%  to 60%. Wall motion was normal; there were no regional wall  motion abnormalities. Left ventricular diastolic function  parameters were normal.  - Aortic valve: There was trivial regurgitation.  - Mitral valve: There was mild regurgitation.  - Pericardium, extracardiac: A trivial pericardial effusion was  identified.    Neuro/Psych Anxiety negative neurological ROS     GI/Hepatic Neg liver ROS, GERD  , hematemesis   Endo/Other  Hypothyroidism   Renal/GU negative Renal ROS  negative genitourinary   Musculoskeletal  (+) Arthritis , Osteoarthritis,    Abdominal   Peds  Hematology negative hematology ROS (+)   Anesthesia Other Findings   Reproductive/Obstetrics negative OB ROS                            Anesthesia Physical  Anesthesia Plan  ASA: 3  Anesthesia Plan: MAC   Post-op Pain Management:    Induction:   PONV Risk Score and Plan: 3 and Ondansetron, Dexamethasone and Propofol infusion  Airway Management Planned: Natural Airway  Additional Equipment:   Intra-op Plan:   Post-operative Plan:   Informed Consent: I have reviewed the patients History  and Physical, chart, labs and discussed the procedure including the risks, benefits and alternatives for the proposed anesthesia with the patient or authorized representative who has indicated his/her understanding and acceptance.     Dental advisory given  Plan Discussed with: Anesthesiologist and CRNA  Anesthesia Plan Comments:        Anesthesia Quick Evaluation

## 2021-08-20 NOTE — H&P (Signed)
Jennifer Yang HPI:  While watching a football game in [place], [place] she fell and broke her left femoral neck.  She was transferred to the local hospital on 07/13/2021 and she had a left hip replacement on 07/15/2021.  Just before the surgery she was document by one staff member that he had coffee-ground emesis.  Nobody else witnessed the event and the patient cannot adequately recall.  Also, before the surgery she developed new onset afib that presented with tachchardia.  Dr. Einar Gip is now managing the issue.  Her HGB was noted to drop in the hospital down from 11 g/dL down to the 7 g/dL range.  She denied any issues with melena or hematochezia.   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   Family history of breast cancer    mother   GERD (gastroesophageal reflux disease)    Hyperlipidemia    Hypertension    Lymphedema of arm 11/24/2011   right arm   MVP (mitral valve prolapse)    PONV (postoperative nausea and vomiting)    has vertigo also-would like scop patch   Syncope 2019   after colonoscopy states "dehydrated"   Thyroid disease    Vertigo     Past Surgical History:  Procedure Laterality Date   BREAST LUMPECTOMY     2001 right   CATARACT EXTRACTION, BILATERAL     COLONOSCOPY  2006   w/Dr.Orr   COLONOSCOPY  2019   fainted afterward at home   [place]     right hammer toe   FOOT NEUROMA SURGERY     left   KNEE ARTHROSCOPY     left   MASS EXCISION  07/21/2012   Procedure: EXCISION MASS;  Surgeon: Adin Hector, MD;  Location: [facility];  Service: General;   Laterality: N/A;  excision 15cm posterior neck mass posterior   MASTECTOMY     MASTECTOMY, RADICAL     2009 right   THYROIDECTOMY, PARTIAL     TOTAL HIP ARTHROPLASTY Right 06/21/2019   Procedure: TOTAL HIP ARTHROPLASTY ANTERIOR APPROACH;  Surgeon: Paralee Cancel, MD;  Location: [facility];  Service: Orthopedics;  Laterality: Right;   UPPER GASTROINTESTINAL ENDOSCOPY      Family History  Problem Relation Age of Onset   Cancer Mother    Heart disease Mother    Heart disease Father    Heart disease Brother    Colon cancer Cousin    Stomach cancer Neg Hx    Rectal cancer Neg Hx    Pancreatic cancer Neg Hx    Esophageal cancer Neg Hx     Social History:  reports that she has never smoked. She has never used smokeless tobacco. She reports that she does not drink alcohol and does not use drugs.  Allergies:  Allergies  Allergen Reactions   Codeine Sulfate Swelling   Fish-Derived Products Other (See Comments)    Cellulitis    Rocephin [Ceftriaxone Sodium] Other (See Comments)    Low blood count  Sulfa Antibiotics Other (See Comments)    Mouth and lip sores    Medications: Scheduled: Continuous:  No results found for this or any previous visit (from the past 24 hour(s)).   No results found.  ROS:  As stated above in the HPI otherwise negative.  There were no vitals taken for this visit.    PE: Gen: NAD, Alert and Oriented HEENT:  [place]/AT, EOMI Neck: Supple, no LAD Lungs: CTA Bilaterally CV: RRR without M/G/R ABD: Soft, NTND, +BS Ext: No C/C/E  Assessment/Plan: 1) Anemia - EGD  Jennifer Yang D 08/20/2021, 12:42 PM

## 2021-08-20 NOTE — Op Note (Signed)
[facility] Patient Name: Jennifer Yang Procedure Date: 08/20/2021 MRN: [id number] Attending MD: Carol Ada , MD Date of Birth: 03/29/1941 CSN: [id number] Age: 80 Admit Type: Outpatient Procedure:                Upper GI endoscopy Indications:              Iron deficiency anemia Providers:                Carol Ada, MD, Elmer Ramp. Tilden Dome, RN, Tyrone Apple, Technician, Luan Moore, Technician,                            Danley Danker, CRNA Referring MD:              Medicines:                 Complications:            No immediate complications. Estimated Blood Loss:     Estimated blood loss: none. Procedure:                Pre-Anesthesia Assessment:                           - Prior to the procedure, a History and Physical                            was performed, and patient medications and                            allergies were reviewed. The patient's tolerance of                            previous anesthesia was also reviewed. The risks                            and benefits of the procedure and the sedation                            options and risks were discussed with the patient.                            All questions were answered, and informed consent                            was obtained. Prior Anticoagulants: The patient has                            taken no previous anticoagulant or antiplatelet                            agents. ASA Grade Assessment: II - A patient with                            mild systemic  disease. After reviewing the risks                            and benefits, the patient was deemed in                            satisfactory condition to undergo the procedure.                           - Sedation was administered by an anesthesia                            professional. Deep sedation was attained.                           After obtaining informed consent, the endoscope was                             passed under direct vision. Throughout the                            procedure, the patient's blood pressure, pulse, and                            oxygen saturations were monitored continuously. The                            [id number]-H190 ([id number]) Olympus endoscope was introduced                            through the mouth, and advanced to the second part                            of duodenum. The upper GI endoscopy was                            accomplished without difficulty. The patient                            tolerated the procedure well. Scope In: Scope Out: Findings:      LA Grade D (one or more mucosal breaks involving at least 75% of       esophageal circumference) esophagitis with no bleeding was found in the       lower third of the esophagus.      A 3 cm hiatal hernia was present.      The stomach was normal.      The examined duodenum was normal.      Friability was noted with passage of the endoscope through the lower       part of the esophagus and the GE junction. Impression:               - LA Grade D reflux esophagitis with no bleeding.                           - 3  cm hiatal hernia.                           - Normal stomach.                           - Normal examined duodenum.                           - No specimens collected. Moderate Sedation:      Not Applicable - Patient had care per Anesthesia. Recommendation:           - Patient has a contact number available for                            emergencies. The signs and symptoms of potential                            delayed complications were discussed with the                            patient. Return to normal activities tomorrow.                            Written discharge instructions were provided to the                            patient.                           - Resume previous diet.                           - Continue present medications.                           - Start  PPI BID x 1 month and then QD.                           - Follow up in one month. Procedure Code(s):        --- Professional ---                           [phone], Esophagogastroduodenoscopy, flexible,                            transoral; diagnostic, including collection of                            specimen(s) by brushing or washing, when performed                            (separate procedure) Diagnosis Code(s):        --- Professional ---                           K21.00, Gastro-esophageal reflux disease with  esophagitis, without bleeding                           K44.9, Diaphragmatic hernia without obstruction or                            gangrene                           D50.9, Iron deficiency anemia, unspecified CPT copyright 2019 [employer] Medical Association. All rights reserved. The codes documented in this report are preliminary and upon coder review may  be revised to meet current compliance requirements. Carol Ada, MD Carol Ada, MD 08/20/2021 2:53:56 PM This report has been signed electronically. Number of Addenda: 0

## 2021-08-20 NOTE — Transfer of Care (Signed)
Immediate Anesthesia Transfer of Care Note  Patient: Jennifer Yang  Procedure(s) Performed: ESOPHAGOGASTRODUODENOSCOPY (EGD) WITH PROPOFOL  Patient Location: Endoscopy Unit  Anesthesia Type:MAC  Level of Consciousness: awake  Airway & Oxygen Therapy: Patient Spontanous Breathing and Patient connected to face mask oxygen  Post-op Assessment: Report given to RN and Post -op Vital signs reviewed and stable  Post vital signs: Reviewed and stable  Last Vitals:  Vitals Value Taken Time  BP    Temp    Pulse    Resp    SpO2      Last Pain:  Vitals:   08/20/21 1342  TempSrc: Temporal  PainSc: 2          Complications: No notable events documented.

## 2021-08-20 NOTE — Anesthesia Postprocedure Evaluation (Signed)
Anesthesia Post Note  Patient: Jennifer Yang  Procedure(s) Performed: ESOPHAGOGASTRODUODENOSCOPY (EGD) WITH PROPOFOL     Patient location during evaluation: PACU Anesthesia Type: MAC Level of consciousness: awake and alert Pain management: pain level controlled Vital Signs Assessment: post-procedure vital signs reviewed and stable Respiratory status: spontaneous breathing and respiratory function stable Cardiovascular status: stable Postop Assessment: no apparent nausea or vomiting Anesthetic complications: no   No notable events documented.  Last Vitals:  Vitals:   08/20/21 1500 08/20/21 1510  BP: (!) 150/80 (!) 163/62  Pulse: (!) 46 (!) 50  Resp: 13 13  Temp:    SpO2: 98% 99%    Last Pain:  Vitals:   08/20/21 1510  TempSrc:   PainSc: 0-No pain                 Arvell Pulsifer DANIEL

## 2021-08-21 ENCOUNTER — Ambulatory Visit: Payer: Medicare Other | Admitting: Cardiology

## 2021-08-21 ENCOUNTER — Encounter: Payer: Self-pay | Admitting: Cardiology

## 2021-08-21 VITALS — BP 132/69 | HR 53 | Temp 98.5°F | Resp 17 | Ht 69.0 in | Wt 179.0 lb

## 2021-08-21 DIAGNOSIS — I480 Paroxysmal atrial fibrillation: Secondary | ICD-10-CM

## 2021-08-21 DIAGNOSIS — E119 Type 2 diabetes mellitus without complications: Secondary | ICD-10-CM | POA: Diagnosis not present

## 2021-08-21 DIAGNOSIS — E7800 Pure hypercholesterolemia, unspecified: Secondary | ICD-10-CM | POA: Diagnosis not present

## 2021-08-21 DIAGNOSIS — R7303 Prediabetes: Secondary | ICD-10-CM | POA: Diagnosis not present

## 2021-08-21 DIAGNOSIS — I10 Essential (primary) hypertension: Secondary | ICD-10-CM

## 2021-08-21 DIAGNOSIS — I2510 Atherosclerotic heart disease of native coronary artery without angina pectoris: Secondary | ICD-10-CM

## 2021-08-21 DIAGNOSIS — I4821 Permanent atrial fibrillation: Secondary | ICD-10-CM | POA: Diagnosis not present

## 2021-08-21 DIAGNOSIS — D649 Anemia, unspecified: Secondary | ICD-10-CM | POA: Diagnosis not present

## 2021-08-21 DIAGNOSIS — M80052D Age-related osteoporosis with current pathological fracture, left femur, subsequent encounter for fracture with routine healing: Secondary | ICD-10-CM | POA: Diagnosis not present

## 2021-08-21 DIAGNOSIS — M1712 Unilateral primary osteoarthritis, left knee: Secondary | ICD-10-CM | POA: Diagnosis not present

## 2021-08-21 MED ORDER — APIXABAN 5 MG PO TABS: 5.0000 mg | ORAL_TABLET | Freq: Two times a day (BID) | ORAL | 3 refills | Status: DC

## 2021-08-21 MED ORDER — APIXABAN 5 MG PO TABS: 5.0000 mg | ORAL_TABLET | Freq: Two times a day (BID) | ORAL | 0 refills | Status: DC

## 2021-08-21 NOTE — Progress Notes (Signed)
Primary Physician/Referring:  [name], MD  Patient ID: Jennifer Yang, female    DOB: 11/19/1940, 80 y.o.   MRN: [id number]  Chief Complaint  Patient presents with   Atrial Fibrillation   New Patient (Initial Visit)    Referred by [name], MD   HPI:    Jennifer Yang  is a 80 y.o. Caucasian female patient with hypertension, hyperlipidemia, prediabetes mellitus, while visiting her grandsons football game, had a accidental fall and had left femoral fracture in the last week of July 2022 and was admitted to [facility] and had new onset A. fib with RVR, spontaneously converted back to sinus rhythm but had a second episode, was started on metoprolol along with diltiazem and discharged home.  She is now referred to me for evaluation of atrial fibrillation, consideration for anticoagulation.  She has recuperated well from hip surgery and she is now walking with help of walker and is much more steady on her feet now.  Her daughter and husband present.  Past Medical History:  Diagnosis Date   Allergy    shell fish   Anxiety    takes xanax on a rare occasion   Arthritis    knees   Benign essential HTN 11/24/2011   Bladder infection 03/18/15   completed antibotics    Breast cancer, right breast ([place]) 02/16/2008   Breast cancer, stage 1 ([place]) 07/16/1990   Cancer ([place])    right Chemo/radiation '01/radical '09   Cataract    Contact lens/glasses fitting    Drug-induced osteoporosis 11/25/2011   Family history of breast cancer    mother   GERD (gastroesophageal reflux disease)    Hyperlipidemia    Hypertension    Lymphedema of arm 11/24/2011   right arm   MVP (mitral valve prolapse)    PONV (postoperative nausea and vomiting)    has vertigo also-would like scop patch   Syncope 2019   after colonoscopy states "dehydrated"   Thyroid disease    Vertigo    Past Surgical History:  Procedure Laterality Date   BREAST LUMPECTOMY     2001 right   CATARACT EXTRACTION, BILATERAL      COLONOSCOPY  2006   w/Dr.Orr   COLONOSCOPY  2019   fainted afterward at home   [place]     right hammer toe   FOOT NEUROMA SURGERY     left   KNEE ARTHROSCOPY     left   MASS EXCISION  07/21/2012   Procedure: EXCISION MASS;  Surgeon: Adin Hector, MD;  Location: [facility];  Service: General;  Laterality: N/A;  excision 15cm posterior neck mass posterior   MASTECTOMY     MASTECTOMY, RADICAL     2009 right   THYROIDECTOMY, PARTIAL     TOTAL HIP ARTHROPLASTY Right 06/21/2019   Procedure: TOTAL HIP ARTHROPLASTY ANTERIOR APPROACH;  Surgeon: Paralee Cancel, MD;  Location[facility];  Service: Orthopedics;  Laterality: Right;   TOTAL HIP ARTHROPLASTY Left 2022   UPPER GASTROINTESTINAL ENDOSCOPY     Family History  Problem Relation Age of Onset   Cancer Mother    Heart disease Mother    Heart disease Father    Heart disease Brother    Colon cancer Cousin    Stomach cancer Neg Hx    Rectal cancer Neg Hx    Pancreatic cancer Neg Hx    Esophageal cancer Neg Hx     Social History   Tobacco Use  Smoking status: Never   Smokeless tobacco: Never   Tobacco comments:    NEVER USED TOBACCO  Substance Use Topics   Alcohol use: No    Alcohol/week: 0.0 standard drinks   Marital Status: Married  ROS  Review of Systems  Cardiovascular:  Positive for palpitations. Negative for chest pain, dyspnea on exertion and leg swelling.  Musculoskeletal:  Positive for arthritis and joint pain.  Gastrointestinal:  Negative for melena.  Objective  Blood pressure 132/69, pulse (!) 53, temperature 98.5 F (36.9 C), temperature source Temporal, resp. rate 17, height '5\' 9"'  (1.753 m), weight 179 lb (81.2 kg), SpO2 99 %. Body mass index is 26.43 kg/m.  Vitals with BMI 08/21/2021 08/20/2021 08/20/2021  Height '5\' 9"'  - -  Weight 179 lbs - -  BMI 45.62 - -  Systolic 563 893 734  Diastolic 69 62 80  Pulse 53 50 46     Physical Exam Neck:     Vascular: No carotid bruit or JVD.   Cardiovascular:     Rate and Rhythm: Regular rhythm. Bradycardia present.     Pulses: Intact distal pulses.     Heart sounds: Normal heart sounds. No murmur heard.   No gallop.  Pulmonary:     Effort: Pulmonary effort is normal.     Breath sounds: Normal breath sounds.  Abdominal:     General: Bowel sounds are normal.     Palpations: Abdomen is soft.  Musculoskeletal:        General: No swelling.     Right lower leg: Edema (2+ below-knee, mostly ankle edema) present.     Left lower leg: Edema (2+ below-knee, mostly ankle edema) present.     Laboratory examination:   Recent Labs    05/13/21 0918  NA 141  K 4.9  CL 105  CO2 27  GLUCOSE 118*  BUN 22  CREATININE 1.10*  CALCIUM 10.1  GFRNONAA 51*   CrCl cannot be calculated (Patient's most recent lab result is older than the maximum 21 days allowed.).  CMP Latest Ref Rng & Units 05/13/2021 05/10/2020 06/24/2019  Glucose 70 - 99 mg/dL 118(H) 107(H) 116(H)  BUN 8 - 23 mg/dL 22 27(H) 23  Creatinine 0.44 - 1.00 mg/dL 1.10(H) 1.21(H) 1.12(H)  Sodium 135 - 145 mmol/L 141 140 140  Potassium 3.5 - 5.1 mmol/L 4.9 4.3 4.2  Chloride 98 - 111 mmol/L 105 105 109  CO2 22 - 32 mmol/L '27 28 24  ' Calcium 8.9 - 10.3 mg/dL 10.1 9.8 8.4(L)  Total Protein 6.5 - 8.1 g/dL 7.0 7.3 -  Total Bilirubin 0.3 - 1.2 mg/dL 1.2 1.2 -  Alkaline Phos 38 - 126 U/L 47 35(L) -  AST 15 - 41 U/L 16 14(L) -  ALT 0 - 44 U/L 10 13 -   CBC Latest Ref Rng & Units 05/13/2021 05/10/2020 06/24/2019  WBC 4.0 - 10.5 K/uL 5.9 6.7 8.7  Hemoglobin 12.0 - 15.0 g/dL 12.7 13.4 8.8(L)  Hematocrit 36.0 - 46.0 % 40.3 41.7 28.0(L)  Platelets 150 - 400 K/uL 160 144(L) 131(L)    External labs:   Cholesterol, total 161.000 M 07/22/2018 HDL 70.000 mg 08/07/2020 LDL 79.000 cal 08/07/2020 Triglycerides 76.000 mg 08/07/2020  Labs 05/17/2021:  A1c 6.0%  Vitamin D 42.3.  BUN 20, creatinine 1.27, EGFR 40 mL, potassium 4.7.  Medications and allergies   Allergies  Allergen Reactions    Codeine Sulfate Swelling   Fish-Derived Products Other (See Comments)    Cellulitis  Rocephin [Ceftriaxone Sodium] Other (See Comments)    Low blood count   Sulfa Antibiotics Other (See Comments)    Mouth and lip sores     Medication prior to this encounter:   Outpatient Medications Prior to Visit  Medication Sig Dispense Refill   ALPRAZolam (XANAX) 0.5 MG tablet Take 0.5 mg by mouth daily as needed for anxiety.      diltiazem (CARDIZEM CD) 120 MG 24 hr capsule Take 120 mg by mouth daily.     fluticasone (FLONASE) 50 MCG/ACT nasal spray Place 1-2 sprays into both nostrils daily as needed for allergies or rhinitis.     losartan (COZAAR) 100 MG tablet Take 100 mg by mouth daily.      metoprolol succinate (TOPROL-XL) 50 MG 24 hr tablet Take 75 mg by mouth daily.     omeprazole (PRILOSEC) 40 MG capsule Take 40 mg by mouth 2 (two) times daily.     rosuvastatin (CRESTOR) 5 MG tablet Take 5 mg by mouth daily.  0   Zoledronic Acid (ZOMETA IV) Inject 1 each into the skin See admin instructions. One a year     amLODipine (NORVASC) 5 MG tablet Take 5 mg by mouth daily.  0   aspirin 81 MG EC tablet Take 81 mg by mouth daily.     amoxicillin (AMOXIL) 500 MG tablet Take 2,000 mg by mouth once. (Patient not taking: Reported on 08/21/2021)     Calcium Carbonate-Vit D-Min (CALTRATE 600+D PLUS MINERALS) 600-800 MG-UNIT CHEW Chew 1 tablet by mouth daily.      fluconazole (DIFLUCAN) 100 MG tablet Take 100 mg by mouth daily.     Influenza Vac High-Dose Quad (FLUZONE HIGH-DOSE QUADRIVALENT) 0.7 ML SUSY Fluzone High-Dose Quad 2020-21 (PF) 240 mcg/0.7 mL IM syringe  PHARMACY ADMINISTERED     metoprolol tartrate (LOPRESSOR) 25 MG tablet Take 12.5 mg by mouth daily as needed. Heart rate up to 100     Zoster Vaccine Adjuvanted (SHINGRIX) injection Shingrix (PF) 50 mcg/0.5 mL intramuscular suspension, kit  TO BE ADMINISTERED BY PHARMACIST FOR IMMUNIZATION     No facility-administered medications prior to visit.      Medication list after today's encounter   Current Outpatient Medications  Medication Instructions   ALPRAZolam (XANAX) 0.5 mg, Oral, Daily PRN   amoxicillin (AMOXIL) 2,000 mg,  Once   apixaban (ELIQUIS) 5 mg, Oral, 2 times daily   apixaban (ELIQUIS) 5 mg, Oral, 2 times daily   diltiazem (CARDIZEM CD) 120 mg, Oral, Daily   fluticasone (FLONASE) 50 MCG/ACT nasal spray 1-2 sprays, Each Nare, Daily PRN   losartan (COZAAR) 100 mg, Oral, Daily   metoprolol succinate (TOPROL-XL) 75 mg, Oral, Daily   omeprazole (PRILOSEC) 40 mg, Oral, 2 times daily   rosuvastatin (CRESTOR) 5 mg, Oral, Daily   Zoledronic Acid (ZOMETA IV) 1 each, Subcutaneous, See admin instructions, One a year    Radiology:   CT of the chest 04/28/2016: 1. No acute cardiopulmonary abnormalities. 2. Aortic atherosclerosis and multi vessel coronary artery calcification. 3. 2 mm nodule in the anterior right upper lobe is stable from previous exam and is compatible with a benign etiology.  OTHER:   Upper GI endoscopy 08/20/2021: Reflux esophagitis with no bleeding.  3 cm hiatal hernia.  Colonoscopy performed on 05/25/2019 revealing 2 1 to 2 mm polyps and mild diverticulosis and nonbleeding internal hemorrhoids.  Cardiac Studies:  [facility], [place], [place]  Echocardiogram 07/16/2021: Normal LV size and normal LV systolic function, EF 37%.  Mild  LVH. Normal RV systolic function and normal size. Trivial pericardial effusion.  EKG:   EKG 09/05/2021: Marked sinus bradycardia at rate of 50 bpm, incomplete right bundle branch block.  Otherwise normal EKG.    EKG 08/15/2021: Atrial fibrillation with rapid ventricular response at rate of 159 bpm.  Poor R wave progression.  Low-voltage complexes.  Nonspecific T abnormality.  Assessment     ICD-10-CM   1. Paroxysmal atrial fibrillation (HCC)  I48.0 EKG 12-Lead    apixaban (ELIQUIS) 5 MG TABS tablet    apixaban (ELIQUIS) 5 MG TABS tablet    2. Primary hypertension   I10     3. Pure hypercholesterolemia  E78.00     4. Pre-diabetes  R73.03     5. Coronary artery calcification seen on CAT scan  I25.10        CHA2DS2-VASc Score is 6.  Yearly risk of stroke: 9.8% (A, F, HTN, Pre DM, Coronary atherosclerosis).  Score of 1=0.6; 2=2.2; 3=3.2; 4=4.8; 5=7.2; 6=9.8; 7=>9.8) -(CHF; HTN; vasc disease DM,  Female = 1; Age <65 =0; 65-74 = 1,  >75 =2; stroke/embolism= 2).    Medications Discontinued During This Encounter  Medication Reason   metoprolol tartrate (LOPRESSOR) 25 MG tablet Error   Calcium Carbonate-Vit D-Min (CALTRATE 600+D PLUS MINERALS) 600-800 MG-UNIT CHEW Error   Influenza Vac High-Dose Quad (FLUZONE HIGH-DOSE QUADRIVALENT) 0.7 ML SUSY Error   fluconazole (DIFLUCAN) 100 MG tablet Error   Zoster Vaccine Adjuvanted [facility]) injection Error   aspirin 81 MG EC tablet Change in therapy   amLODipine (NORVASC) 5 MG tablet Duplicate    Meds ordered this encounter  Medications   apixaban (ELIQUIS) 5 MG TABS tablet    Sig: Take 1 tablet (5 mg total) by mouth 2 (two) times daily.    Dispense:  180 tablet    Refill:  3   apixaban (ELIQUIS) 5 MG TABS tablet    Sig: Take 1 tablet (5 mg total) by mouth 2 (two) times daily.    Dispense:  60 tablet    Refill:  0   Orders Placed This Encounter  Procedures   EKG 12-Lead   Recommendations:   Jennifer Yang is a 80 y.o. Caucasian female patient with hypertension, hyperlipidemia, prediabetes mellitus, while visiting her grandsons football game, had a accidental fall and had left femoral fracture in the last week of July 2022 and was admitted to [facility] and had new onset A. fib with RVR, spontaneously converted back to sinus rhythm but had a second episode, was started on metoprolol along with diltiazem and discharged home.  She has had brief episodes of palpitations, less than a minute which I suspect could be A. fib or PACs and PVCs.  Today she is in sinus rhythm.  In view of extreme high  chads vascular score, best option is to proceed with anticoagulation.  Her anemia during hospitalization was probably related to postop bleeding and question of GI bleed was ruled out by recent EGD which I evaluated.  Discontinue amlodipine as she is on diltiazem and metoprolol, she has asymptomatic marked sinus bradycardia, continue the same for now.  I evaluated her CT scan performed remotely of the chest, she has multivessel coronary calcifications however during A. fib with RVR episodes, she did not have any ischemic changes on EKG and no periprocedural heart failure as well.  I do not think she needs ischemic work-up, she is already on a statin, continue the same for now and discontinue aspirin as  I am starting her on Eliquis.  I would like to see her back in 3 months for follow-up.  This was a 60-minute office visit encounter in review of external records, review of external labs, face-to-face encounter and discussion with the patient regarding anticoagulation and risk of bleeding versus benefits in stroke risk reduction.    Adrian Prows, MD, [facility] 08/21/2021, 12:47 PM Office: [phone]

## 2021-08-22 ENCOUNTER — Telehealth: Payer: Self-pay | Admitting: Student

## 2021-08-22 DIAGNOSIS — N3020 Other chronic cystitis without hematuria: Secondary | ICD-10-CM | POA: Diagnosis not present

## 2021-08-22 DIAGNOSIS — R350 Frequency of micturition: Secondary | ICD-10-CM | POA: Diagnosis not present

## 2021-08-22 NOTE — Telephone Encounter (Signed)
External labs  08/06/2021: HCT 37.0, Hgb 11.6, MCV 92.0, platelet 258 ALT 22, AST 17, BUN 27, creatinine 1.35, glucose 101, potassium 4.3, sodium 141, GFR 37 TSH 1.01 A1c 6.0% Vitamin B12 >2000, vitamin D 42.3  05/16/2021: ALT 19, AST 16, BUN 20, creatinine 1.27, potassium 4.7, sodium 142, GFR 46  02/07/2021: TSH 1.01 Hgb 12.9, HCT 40.4, platelet 160 BUN 20, creatinine 1.2, GFR 46, potassium 6.2 Total cholesterol 160, triglycerides 129, HDL 61, LDL 73

## 2021-08-23 DIAGNOSIS — I480 Paroxysmal atrial fibrillation: Secondary | ICD-10-CM | POA: Diagnosis not present

## 2021-08-23 DIAGNOSIS — D649 Anemia, unspecified: Secondary | ICD-10-CM | POA: Diagnosis not present

## 2021-08-23 DIAGNOSIS — I10 Essential (primary) hypertension: Secondary | ICD-10-CM | POA: Diagnosis not present

## 2021-08-23 DIAGNOSIS — M80052D Age-related osteoporosis with current pathological fracture, left femur, subsequent encounter for fracture with routine healing: Secondary | ICD-10-CM | POA: Diagnosis not present

## 2021-08-23 DIAGNOSIS — E119 Type 2 diabetes mellitus without complications: Secondary | ICD-10-CM | POA: Diagnosis not present

## 2021-08-23 DIAGNOSIS — M1712 Unilateral primary osteoarthritis, left knee: Secondary | ICD-10-CM | POA: Diagnosis not present

## 2021-08-27 DIAGNOSIS — I10 Essential (primary) hypertension: Secondary | ICD-10-CM | POA: Diagnosis not present

## 2021-08-27 DIAGNOSIS — I480 Paroxysmal atrial fibrillation: Secondary | ICD-10-CM | POA: Diagnosis not present

## 2021-08-27 DIAGNOSIS — M80052D Age-related osteoporosis with current pathological fracture, left femur, subsequent encounter for fracture with routine healing: Secondary | ICD-10-CM | POA: Diagnosis not present

## 2021-08-27 DIAGNOSIS — E119 Type 2 diabetes mellitus without complications: Secondary | ICD-10-CM | POA: Diagnosis not present

## 2021-08-27 DIAGNOSIS — D649 Anemia, unspecified: Secondary | ICD-10-CM | POA: Diagnosis not present

## 2021-08-27 DIAGNOSIS — M1712 Unilateral primary osteoarthritis, left knee: Secondary | ICD-10-CM | POA: Diagnosis not present

## 2021-08-29 DIAGNOSIS — I10 Essential (primary) hypertension: Secondary | ICD-10-CM | POA: Diagnosis not present

## 2021-08-29 DIAGNOSIS — M1712 Unilateral primary osteoarthritis, left knee: Secondary | ICD-10-CM | POA: Diagnosis not present

## 2021-08-29 DIAGNOSIS — M80052D Age-related osteoporosis with current pathological fracture, left femur, subsequent encounter for fracture with routine healing: Secondary | ICD-10-CM | POA: Diagnosis not present

## 2021-08-29 DIAGNOSIS — D649 Anemia, unspecified: Secondary | ICD-10-CM | POA: Diagnosis not present

## 2021-08-29 DIAGNOSIS — E119 Type 2 diabetes mellitus without complications: Secondary | ICD-10-CM | POA: Diagnosis not present

## 2021-08-29 DIAGNOSIS — I480 Paroxysmal atrial fibrillation: Secondary | ICD-10-CM | POA: Diagnosis not present

## 2021-08-30 DIAGNOSIS — Z4889 Encounter for other specified surgical aftercare: Secondary | ICD-10-CM | POA: Diagnosis not present

## 2021-08-30 DIAGNOSIS — Z96649 Presence of unspecified artificial hip joint: Secondary | ICD-10-CM | POA: Diagnosis not present

## 2021-09-01 DIAGNOSIS — K922 Gastrointestinal hemorrhage, unspecified: Secondary | ICD-10-CM | POA: Diagnosis not present

## 2021-09-01 DIAGNOSIS — E785 Hyperlipidemia, unspecified: Secondary | ICD-10-CM | POA: Diagnosis not present

## 2021-09-01 DIAGNOSIS — Z96642 Presence of left artificial hip joint: Secondary | ICD-10-CM | POA: Diagnosis not present

## 2021-09-01 DIAGNOSIS — K5900 Constipation, unspecified: Secondary | ICD-10-CM | POA: Diagnosis not present

## 2021-09-01 DIAGNOSIS — E538 Deficiency of other specified B group vitamins: Secondary | ICD-10-CM | POA: Diagnosis not present

## 2021-09-01 DIAGNOSIS — W19XXXD Unspecified fall, subsequent encounter: Secondary | ICD-10-CM | POA: Diagnosis not present

## 2021-09-01 DIAGNOSIS — Z9181 History of falling: Secondary | ICD-10-CM | POA: Diagnosis not present

## 2021-09-01 DIAGNOSIS — I058 Other rheumatic mitral valve diseases: Secondary | ICD-10-CM | POA: Diagnosis not present

## 2021-09-01 DIAGNOSIS — M1712 Unilateral primary osteoarthritis, left knee: Secondary | ICD-10-CM | POA: Diagnosis not present

## 2021-09-01 DIAGNOSIS — I10 Essential (primary) hypertension: Secondary | ICD-10-CM | POA: Diagnosis not present

## 2021-09-01 DIAGNOSIS — F419 Anxiety disorder, unspecified: Secondary | ICD-10-CM | POA: Diagnosis not present

## 2021-09-01 DIAGNOSIS — M80052D Age-related osteoporosis with current pathological fracture, left femur, subsequent encounter for fracture with routine healing: Secondary | ICD-10-CM | POA: Diagnosis not present

## 2021-09-01 DIAGNOSIS — Z7982 Long term (current) use of aspirin: Secondary | ICD-10-CM | POA: Diagnosis not present

## 2021-09-01 DIAGNOSIS — E119 Type 2 diabetes mellitus without complications: Secondary | ICD-10-CM | POA: Diagnosis not present

## 2021-09-01 DIAGNOSIS — I890 Lymphedema, not elsewhere classified: Secondary | ICD-10-CM | POA: Diagnosis not present

## 2021-09-01 DIAGNOSIS — I700 Atherosclerosis of aorta: Secondary | ICD-10-CM | POA: Diagnosis not present

## 2021-09-01 DIAGNOSIS — I480 Paroxysmal atrial fibrillation: Secondary | ICD-10-CM | POA: Diagnosis not present

## 2021-09-01 DIAGNOSIS — Z853 Personal history of malignant neoplasm of breast: Secondary | ICD-10-CM | POA: Diagnosis not present

## 2021-09-01 DIAGNOSIS — D649 Anemia, unspecified: Secondary | ICD-10-CM | POA: Diagnosis not present

## 2021-09-03 DIAGNOSIS — E119 Type 2 diabetes mellitus without complications: Secondary | ICD-10-CM | POA: Diagnosis not present

## 2021-09-03 DIAGNOSIS — I480 Paroxysmal atrial fibrillation: Secondary | ICD-10-CM | POA: Diagnosis not present

## 2021-09-03 DIAGNOSIS — M80052D Age-related osteoporosis with current pathological fracture, left femur, subsequent encounter for fracture with routine healing: Secondary | ICD-10-CM | POA: Diagnosis not present

## 2021-09-03 DIAGNOSIS — D649 Anemia, unspecified: Secondary | ICD-10-CM | POA: Diagnosis not present

## 2021-09-03 DIAGNOSIS — M1712 Unilateral primary osteoarthritis, left knee: Secondary | ICD-10-CM | POA: Diagnosis not present

## 2021-09-03 DIAGNOSIS — I10 Essential (primary) hypertension: Secondary | ICD-10-CM | POA: Diagnosis not present

## 2021-09-04 DIAGNOSIS — E119 Type 2 diabetes mellitus without complications: Secondary | ICD-10-CM | POA: Diagnosis not present

## 2021-09-04 DIAGNOSIS — M1712 Unilateral primary osteoarthritis, left knee: Secondary | ICD-10-CM | POA: Diagnosis not present

## 2021-09-04 DIAGNOSIS — I10 Essential (primary) hypertension: Secondary | ICD-10-CM | POA: Diagnosis not present

## 2021-09-04 DIAGNOSIS — M80052D Age-related osteoporosis with current pathological fracture, left femur, subsequent encounter for fracture with routine healing: Secondary | ICD-10-CM | POA: Diagnosis not present

## 2021-09-04 DIAGNOSIS — I480 Paroxysmal atrial fibrillation: Secondary | ICD-10-CM | POA: Diagnosis not present

## 2021-09-04 DIAGNOSIS — D649 Anemia, unspecified: Secondary | ICD-10-CM | POA: Diagnosis not present

## 2021-09-06 DIAGNOSIS — E119 Type 2 diabetes mellitus without complications: Secondary | ICD-10-CM | POA: Diagnosis not present

## 2021-09-06 DIAGNOSIS — D649 Anemia, unspecified: Secondary | ICD-10-CM | POA: Diagnosis not present

## 2021-09-06 DIAGNOSIS — M80052D Age-related osteoporosis with current pathological fracture, left femur, subsequent encounter for fracture with routine healing: Secondary | ICD-10-CM | POA: Diagnosis not present

## 2021-09-06 DIAGNOSIS — I480 Paroxysmal atrial fibrillation: Secondary | ICD-10-CM | POA: Diagnosis not present

## 2021-09-06 DIAGNOSIS — Z96649 Presence of unspecified artificial hip joint: Secondary | ICD-10-CM | POA: Diagnosis not present

## 2021-09-10 ENCOUNTER — Other Ambulatory Visit: Payer: Self-pay | Admitting: Cardiology

## 2021-09-10 DIAGNOSIS — K922 Gastrointestinal hemorrhage, unspecified: Secondary | ICD-10-CM | POA: Diagnosis not present

## 2021-09-10 DIAGNOSIS — I480 Paroxysmal atrial fibrillation: Secondary | ICD-10-CM

## 2021-09-10 DIAGNOSIS — E119 Type 2 diabetes mellitus without complications: Secondary | ICD-10-CM | POA: Diagnosis not present

## 2021-09-10 DIAGNOSIS — Z96649 Presence of unspecified artificial hip joint: Secondary | ICD-10-CM | POA: Diagnosis not present

## 2021-09-10 DIAGNOSIS — E785 Hyperlipidemia, unspecified: Secondary | ICD-10-CM | POA: Diagnosis not present

## 2021-09-10 DIAGNOSIS — I10 Essential (primary) hypertension: Secondary | ICD-10-CM | POA: Diagnosis not present

## 2021-09-12 DIAGNOSIS — Z96649 Presence of unspecified artificial hip joint: Secondary | ICD-10-CM | POA: Diagnosis not present

## 2021-09-16 DIAGNOSIS — I129 Hypertensive chronic kidney disease with stage 1 through stage 4 chronic kidney disease, or unspecified chronic kidney disease: Secondary | ICD-10-CM | POA: Diagnosis not present

## 2021-09-16 DIAGNOSIS — E1122 Type 2 diabetes mellitus with diabetic chronic kidney disease: Secondary | ICD-10-CM | POA: Diagnosis not present

## 2021-09-16 DIAGNOSIS — N1831 Chronic kidney disease, stage 3a: Secondary | ICD-10-CM | POA: Diagnosis not present

## 2021-09-16 DIAGNOSIS — E785 Hyperlipidemia, unspecified: Secondary | ICD-10-CM | POA: Diagnosis not present

## 2021-09-17 DIAGNOSIS — Z96649 Presence of unspecified artificial hip joint: Secondary | ICD-10-CM | POA: Diagnosis not present

## 2021-09-18 DIAGNOSIS — D509 Iron deficiency anemia, unspecified: Secondary | ICD-10-CM | POA: Diagnosis not present

## 2021-09-18 DIAGNOSIS — K2101 Gastro-esophageal reflux disease with esophagitis, with bleeding: Secondary | ICD-10-CM | POA: Diagnosis not present

## 2021-09-19 DIAGNOSIS — Z96649 Presence of unspecified artificial hip joint: Secondary | ICD-10-CM | POA: Diagnosis not present

## 2021-09-24 DIAGNOSIS — Z961 Presence of intraocular lens: Secondary | ICD-10-CM | POA: Diagnosis not present

## 2021-09-24 DIAGNOSIS — H5213 Myopia, bilateral: Secondary | ICD-10-CM | POA: Diagnosis not present

## 2021-09-24 DIAGNOSIS — H43813 Vitreous degeneration, bilateral: Secondary | ICD-10-CM | POA: Diagnosis not present

## 2021-09-24 DIAGNOSIS — H26491 Other secondary cataract, right eye: Secondary | ICD-10-CM | POA: Diagnosis not present

## 2021-09-25 DIAGNOSIS — M1712 Unilateral primary osteoarthritis, left knee: Secondary | ICD-10-CM | POA: Diagnosis not present

## 2021-09-26 ENCOUNTER — Other Ambulatory Visit: Payer: Self-pay | Admitting: Cardiology

## 2021-09-26 DIAGNOSIS — I480 Paroxysmal atrial fibrillation: Secondary | ICD-10-CM

## 2021-09-27 DIAGNOSIS — Z96649 Presence of unspecified artificial hip joint: Secondary | ICD-10-CM | POA: Diagnosis not present

## 2021-10-01 DIAGNOSIS — Z96649 Presence of unspecified artificial hip joint: Secondary | ICD-10-CM | POA: Diagnosis not present

## 2021-10-02 DIAGNOSIS — R8290 Unspecified abnormal findings in urine: Secondary | ICD-10-CM | POA: Diagnosis not present

## 2021-10-02 DIAGNOSIS — I10 Essential (primary) hypertension: Secondary | ICD-10-CM | POA: Diagnosis not present

## 2021-10-03 DIAGNOSIS — H0012 Chalazion right lower eyelid: Secondary | ICD-10-CM | POA: Diagnosis not present

## 2021-10-04 DIAGNOSIS — Z96649 Presence of unspecified artificial hip joint: Secondary | ICD-10-CM | POA: Diagnosis not present

## 2021-10-16 DIAGNOSIS — I129 Hypertensive chronic kidney disease with stage 1 through stage 4 chronic kidney disease, or unspecified chronic kidney disease: Secondary | ICD-10-CM | POA: Diagnosis not present

## 2021-10-16 DIAGNOSIS — E1122 Type 2 diabetes mellitus with diabetic chronic kidney disease: Secondary | ICD-10-CM | POA: Diagnosis not present

## 2021-10-16 DIAGNOSIS — E785 Hyperlipidemia, unspecified: Secondary | ICD-10-CM | POA: Diagnosis not present

## 2021-10-16 DIAGNOSIS — N1831 Chronic kidney disease, stage 3a: Secondary | ICD-10-CM | POA: Diagnosis not present

## 2021-10-18 DIAGNOSIS — Z96649 Presence of unspecified artificial hip joint: Secondary | ICD-10-CM | POA: Diagnosis not present

## 2021-10-21 DIAGNOSIS — Z4889 Encounter for other specified surgical aftercare: Secondary | ICD-10-CM | POA: Diagnosis not present

## 2021-10-22 DIAGNOSIS — Z96649 Presence of unspecified artificial hip joint: Secondary | ICD-10-CM | POA: Diagnosis not present

## 2021-10-23 DIAGNOSIS — I10 Essential (primary) hypertension: Secondary | ICD-10-CM | POA: Diagnosis not present

## 2021-10-23 DIAGNOSIS — M25562 Pain in left knee: Secondary | ICD-10-CM | POA: Diagnosis not present

## 2021-10-24 DIAGNOSIS — Z01419 Encounter for gynecological examination (general) (routine) without abnormal findings: Secondary | ICD-10-CM | POA: Diagnosis not present

## 2021-10-24 DIAGNOSIS — N8111 Cystocele, midline: Secondary | ICD-10-CM | POA: Diagnosis not present

## 2021-10-25 DIAGNOSIS — Z96649 Presence of unspecified artificial hip joint: Secondary | ICD-10-CM | POA: Diagnosis not present

## 2021-10-29 DIAGNOSIS — Z96649 Presence of unspecified artificial hip joint: Secondary | ICD-10-CM | POA: Diagnosis not present

## 2021-10-30 DIAGNOSIS — C50911 Malignant neoplasm of unspecified site of right female breast: Secondary | ICD-10-CM | POA: Diagnosis not present

## 2021-10-30 DIAGNOSIS — Z170 Estrogen receptor positive status [ER+]: Secondary | ICD-10-CM | POA: Diagnosis not present

## 2021-10-31 DIAGNOSIS — Z96649 Presence of unspecified artificial hip joint: Secondary | ICD-10-CM | POA: Diagnosis not present

## 2021-11-05 DIAGNOSIS — Z20822 Contact with and (suspected) exposure to covid-19: Secondary | ICD-10-CM | POA: Diagnosis not present

## 2021-11-06 DIAGNOSIS — Z96649 Presence of unspecified artificial hip joint: Secondary | ICD-10-CM | POA: Diagnosis not present

## 2021-11-12 DIAGNOSIS — Z96649 Presence of unspecified artificial hip joint: Secondary | ICD-10-CM | POA: Diagnosis not present

## 2021-11-15 DIAGNOSIS — I129 Hypertensive chronic kidney disease with stage 1 through stage 4 chronic kidney disease, or unspecified chronic kidney disease: Secondary | ICD-10-CM | POA: Diagnosis not present

## 2021-11-15 DIAGNOSIS — E785 Hyperlipidemia, unspecified: Secondary | ICD-10-CM | POA: Diagnosis not present

## 2021-11-15 DIAGNOSIS — E1122 Type 2 diabetes mellitus with diabetic chronic kidney disease: Secondary | ICD-10-CM | POA: Diagnosis not present

## 2021-11-15 DIAGNOSIS — N1831 Chronic kidney disease, stage 3a: Secondary | ICD-10-CM | POA: Diagnosis not present

## 2021-11-21 ENCOUNTER — Encounter: Payer: Self-pay | Admitting: Cardiology

## 2021-11-21 ENCOUNTER — Ambulatory Visit: Payer: Medicare Other | Admitting: Cardiology

## 2021-11-21 ENCOUNTER — Other Ambulatory Visit: Payer: Self-pay

## 2021-11-21 VITALS — BP 153/69 | HR 52 | Temp 97.6°F | Resp 17 | Ht 69.0 in | Wt 181.8 lb

## 2021-11-21 DIAGNOSIS — I480 Paroxysmal atrial fibrillation: Secondary | ICD-10-CM | POA: Diagnosis not present

## 2021-11-21 DIAGNOSIS — I10 Essential (primary) hypertension: Secondary | ICD-10-CM | POA: Diagnosis not present

## 2021-11-21 DIAGNOSIS — R001 Bradycardia, unspecified: Secondary | ICD-10-CM | POA: Diagnosis not present

## 2021-11-21 MED ORDER — APIXABAN 5 MG PO TABS: 5.0000 mg | ORAL_TABLET | Freq: Two times a day (BID) | ORAL | 3 refills | Status: DC

## 2021-11-21 MED ORDER — SPIRONOLACTONE 25 MG PO TABS: 25.0000 mg | ORAL_TABLET | ORAL | 2 refills | Status: DC

## 2021-11-21 NOTE — Addendum Note (Signed)
Addended by: Kela Millin on: 11/21/2021 09:42 AM   Modules accepted: Orders

## 2021-11-21 NOTE — Progress Notes (Addendum)
Primary Physician/Referring:  [name], MD  Patient ID: Jennifer Yang, female    DOB: [birth year], 81 y.o.   MRN: [id number]  Chief Complaint  Patient presents with   Atrial Fibrillation   Hypertension   Follow-up    3 months   HPI:    Jennifer Yang  is a 81 y.o. Caucasian female patient with hypertension, hyperlipidemia, prediabetes mellitus, had a accidental fall and had left femoral fracture in the last week of July 2022 and was admitted to [facility] and had new onset A. fib with RVR, spontaneously converted back to sinus rhythm but had a second episode, was started on metoprolol along with diltiazem and discharged home.  She has had brief episodes of palpitations, less than a minute which I suspect could be A. fib or PACs and PVCs but cannot exclude atrial fibrillation.  I started her on Eliquis due to high CHA2DS2-VASc risk score.  Her husband present.  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   Family history of breast cancer    mother   GERD (gastroesophageal reflux disease)    Hyperlipidemia    Hypertension    Lymphedema of arm 11/24/2011   right arm   MVP (mitral valve prolapse)    PONV (postoperative nausea and vomiting)    has vertigo also-would like scop patch   Syncope 2019   after colonoscopy states "dehydrated"   Thyroid disease    Vertigo    Past Surgical History:  Procedure Laterality Date   BREAST LUMPECTOMY     2001 right   CATARACT EXTRACTION, BILATERAL     COLONOSCOPY  2006   w/Dr.Orr   COLONOSCOPY  2019   fainted afterward at home    ESOPHAGOGASTRODUODENOSCOPY (EGD) WITH PROPOFOL N/A 08/20/2021   Procedure: ESOPHAGOGASTRODUODENOSCOPY (EGD) WITH PROPOFOL;  Surgeon: Carol Ada, MD;  [facility];  Service: Endoscopy;  Laterality: N/A;   FOOT AMPUTATION     right hammer toe   FOOT NEUROMA SURGERY     left   KNEE ARTHROSCOPY     left   MASS EXCISION  07/21/2012   Procedure: EXCISION MASS;  Surgeon: Adin Hector, MD;  Location: [place];  Service: General;  Laterality: N/A;  excision 15cm posterior neck mass posterior   MASTECTOMY     MASTECTOMY, RADICAL     2009 right   THYROIDECTOMY, PARTIAL     TOTAL HIP ARTHROPLASTY Right 06/21/2019   Procedure: TOTAL HIP ARTHROPLASTY ANTERIOR APPROACH;  Surgeon: Paralee Cancel, MD;  [facility]: [facility];  Service: Orthopedics;  Laterality: Right;   TOTAL HIP ARTHROPLASTY Left 2022   UPPER GASTROINTESTINAL ENDOSCOPY     Family History  Problem Relation Age of Onset   Cancer Mother    Heart disease Mother    Heart disease Father    Heart disease Brother    Colon cancer Cousin    Stomach cancer Neg Hx    Rectal cancer Neg Hx    Pancreatic cancer Neg Hx  Esophageal cancer Neg Hx     Social History   Tobacco Use   Smoking status: Never   Smokeless tobacco: Never   Tobacco comments:    NEVER USED TOBACCO  Substance Use Topics   Alcohol use: No    Alcohol/week: 0.0 standard drinks   Marital Status: Married  ROS  Review of Systems  Cardiovascular:  Negative for chest pain, dyspnea on exertion, leg swelling and palpitations.  Musculoskeletal:  Positive for arthritis and joint pain.  Gastrointestinal:  Negative for melena.  Neurological:  Positive for dizziness.  Objective  Blood pressure (!) 153/69, pulse (!) 52, temperature 97.6 F (36.4 C), temperature source Temporal, resp. rate 17, height '5\' 9"'  (1.753 m), weight 181 lb 12.8 oz (82.5 kg), SpO2 99 %. Body mass index is 26.85 kg/m.  Vitals with BMI 11/21/2021 08/21/2021 08/20/2021  Height 5'  9" '5\' 9"'  -  Weight 181 lbs 13 oz 179 lbs -  BMI 57.97 28.20 -  Systolic 601 561 537  Diastolic 69 69 62  Pulse 52 53 50     Physical Exam Neck:     Vascular: No carotid bruit or JVD.  Cardiovascular:     Rate and Rhythm: Regular rhythm. Bradycardia present.     Pulses: Intact distal pulses.     Heart sounds: Normal heart sounds. No murmur heard.   No gallop.  Pulmonary:     Effort: Pulmonary effort is normal.     Breath sounds: Normal breath sounds.  Abdominal:     General: Bowel sounds are normal.     Palpations: Abdomen is soft.  Musculoskeletal:        General: No swelling.     Right lower leg: Edema (2+ below-knee, mostly ankle edema) present.     Left lower leg: Edema (2+ below-knee, mostly ankle edema) present.     Laboratory examination:   Recent Labs    05/13/21 0918  NA 141  K 4.9  CL 105  CO2 27  GLUCOSE 118*  BUN 22  CREATININE 1.10*  CALCIUM 10.1  GFRNONAA 51*   CrCl cannot be calculated (Patient's most recent lab result is older than the maximum 21 days allowed.).  CMP Latest Ref Rng & Units 05/13/2021 05/10/2020 06/24/2019  Glucose 70 - 99 mg/dL 118(H) 107(H) 116(H)  BUN 8 - 23 mg/dL 22 27(H) 23  Creatinine 0.44 - 1.00 mg/dL 1.10(H) 1.21(H) 1.12(H)  Sodium 135 - 145 mmol/L 141 140 140  Potassium 3.5 - 5.1 mmol/L 4.9 4.3 4.2  Chloride 98 - 111 mmol/L 105 105 109  CO2 22 - 32 mmol/L '27 28 24  ' Calcium 8.9 - 10.3 mg/dL 10.1 9.8 8.4(L)  Total Protein 6.5 - 8.1 g/dL 7.0 7.3 -  Total Bilirubin 0.3 - 1.2 mg/dL 1.2 1.2 -  Alkaline Phos 38 - 126 U/L 47 35(L) -  AST 15 - 41 U/L 16 14(L) -  ALT 0 - 44 U/L 10 13 -   CBC Latest Ref Rng & Units 05/13/2021 05/10/2020 06/24/2019  WBC 4.0 - 10.5 K/uL 5.9 6.7 8.7  Hemoglobin 12.0 - 15.0 g/dL 12.7 13.4 8.8(L)  Hematocrit 36.0 - 46.0 % 40.3 41.7 28.0(L)  Platelets 150 - 400 K/uL 160 144(L) 131(L)    External labs:   08/06/2021: HCT 37.0, Hgb 11.6, MCV 92.0, platelet 258 ALT 22, AST 17, BUN 27, creatinine 1.35,  glucose 101, potassium 4.3, sodium 141, GFR 37 TSH 1.01 A1c 6.0% Vitamin B12 >2000, vitamin D 42.3  05/16/2021: ALT 19,  AST 16, BUN 20, creatinine 1.27, potassium 4.7, sodium 142, GFR 46  02/07/2021: TSH 1.01 Hgb 12.9, HCT 40.4, platelet 160 BUN 20, creatinine 1.2, GFR 46, potassium 6.2 Total cholesterol 160, triglycerides 129, HDL 61, LDL 73 Cholesterol, total 161.000 M 07/22/2018 HDL 70.000 mg 08/07/2020 LDL 79.000 cal 08/07/2020 Triglycerides 76.000 mg 08/07/2020  Labs 05/17/2021:  A1c 6.0%  Vitamin D 42.3.  BUN 20, creatinine 1.27, EGFR 40 mL, potassium 4.7.  Medications and allergies   Allergies  Allergen Reactions   Codeine Sulfate Swelling   Fish-Derived Products Other (See Comments)    Cellulitis    Rocephin [Ceftriaxone Sodium] Other (See Comments)    Low blood count   Sulfa Antibiotics Other (See Comments)    Mouth and lip sores     Medication prior to this encounter:   Outpatient Medications Prior to Visit  Medication Sig Dispense Refill   ALPRAZolam (XANAX) 0.5 MG tablet Take 0.5 mg by mouth daily as needed for anxiety.      amLODipine (NORVASC) 5 MG tablet Take 5 mg by mouth daily.     amoxicillin (AMOXIL) 500 MG tablet Take 2,000 mg by mouth once.     fluticasone (FLONASE) 50 MCG/ACT nasal spray Place 1-2 sprays into both nostrils daily as needed for allergies or rhinitis.     losartan (COZAAR) 100 MG tablet Take 100 mg by mouth daily.      metoprolol succinate (TOPROL-XL) 50 MG 24 hr tablet Take 50 mg by mouth daily.     nitrofurantoin, macrocrystal-monohydrate, (MACROBID) 100 MG capsule Take 100 mg by mouth daily.     omeprazole (PRILOSEC) 40 MG capsule Take 40 mg by mouth as needed.     rosuvastatin (CRESTOR) 5 MG tablet Take 5 mg by mouth daily.  0   Zoledronic Acid (ZOMETA IV) Inject 1 each into the skin See admin instructions. One a year     apixaban (ELIQUIS) 5 MG TABS tablet Take 1 tablet (5 mg total) by mouth 2 (two) times daily. 180 tablet 3    diltiazem (CARDIZEM CD) 120 MG 24 hr capsule Take 120 mg by mouth daily.     ELIQUIS 5 MG TABS tablet TAKE 1 TABLET BY MOUTH TWICE A DAY 60 tablet 0   No facility-administered medications prior to visit.     Medication list after today's encounter   Current Outpatient Medications  Medication Instructions   ALPRAZolam (XANAX) 0.5 mg, Oral, Daily PRN   amLODipine (NORVASC) 5 mg, Oral, Daily   amoxicillin (AMOXIL) 2,000 mg, Oral,  Once   apixaban (ELIQUIS) 5 mg, Oral, 2 times daily   fluticasone (FLONASE) 50 MCG/ACT nasal spray 1-2 sprays, Each Nare, Daily PRN   losartan (COZAAR) 100 mg, Oral, Daily   metoprolol succinate (TOPROL-XL) 50 mg, Oral, Daily   nitrofurantoin (macrocrystal-monohydrate) (MACROBID) 100 mg, Oral, Daily   omeprazole (PRILOSEC) 40 mg, Oral, As needed   rosuvastatin (CRESTOR) 5 mg, Oral, Daily   spironolactone (ALDACTONE) 25 mg, Oral, BH-each morning   Zoledronic Acid (ZOMETA IV) 1 each, Subcutaneous, See admin instructions, One a year    Radiology:   CT of the chest 04/28/2016: 1. No acute cardiopulmonary abnormalities. 2. Aortic atherosclerosis and multi vessel coronary artery calcification. 3. 2 mm nodule in the anterior right upper lobe is stable from previous exam and is compatible with a benign etiology.  OTHER:   Upper GI endoscopy 08/20/2021: Reflux esophagitis with no bleeding.  3 cm hiatal hernia.  Colonoscopy performed on 05/25/2019 revealing 2  1 to 2 mm polyps and mild diverticulosis and nonbleeding internal hemorrhoids.  Cardiac Studies:  [facility], [place], [place]  Echocardiogram 07/16/2021: Normal LV size and normal LV systolic function, EF 65%.  Mild LVH. Normal RV systolic function and normal size. Trivial pericardial effusion.  EKG:   .  EKG 11/21/2021: Sinus bradycardia at rate of 50 bpm, normal axis, incomplete right bundle branch block.  Otherwise normal EKG.  No significant change from prior EKG 09/05/2021, previous heart rate was  also 50 bpm.  EKG 08/15/2021: Atrial fibrillation with rapid ventricular response at rate of 159 bpm.  Poor R wave progression.  Low-voltage complexes.  Nonspecific T abnormality.  Assessment     ICD-10-CM   1. Paroxysmal atrial fibrillation (HCC)  I48.0 EKG 12-Lead    apixaban (ELIQUIS) 5 MG TABS tablet    2. Primary hypertension  I10 spironolactone (ALDACTONE) 25 MG tablet    Basic metabolic panel    3. Bradycardia by electrocardiogram  R00.1        CHA2DS2-VASc Score is 6.  Yearly risk of stroke: 9.8% (A, F, HTN, Pre DM, Coronary atherosclerosis).  Score of 1=0.6; 2=2.2; 3=3.2; 4=4.8; 5=7.2; 6=9.8; 7=>9.8) -(CHF; HTN; vasc disease DM,  Female = 1; Age <65 =0; 65-74 = 1,  >75 =2; stroke/embolism= 2).    Medications Discontinued During This Encounter  Medication Reason   ELIQUIS 5 MG TABS tablet    diltiazem (CARDIZEM CD) 120 MG 24 hr capsule Discontinued by provider   apixaban (ELIQUIS) 5 MG TABS tablet Reorder    Meds ordered this encounter  Medications   spironolactone (ALDACTONE) 25 MG tablet    Sig: Take 1 tablet (25 mg total) by mouth every morning.    Dispense:  30 tablet    Refill:  2   apixaban (ELIQUIS) 5 MG TABS tablet    Sig: Take 1 tablet (5 mg total) by mouth 2 (two) times daily.    Dispense:  200 tablet    Refill:  3   Orders Placed This Encounter  Procedures   Basic metabolic panel   EKG 99-JTTS   Recommendations:   LURLIE WIGEN is a 81 y.o. Caucasian female patient with hypertension, hyperlipidemia, prediabetes mellitus, had a accidental fall and had left femoral fracture in the last week of July 2022 and was admitted to [facility] and had new onset A. fib with RVR, spontaneously converted back to sinus rhythm but had a second episode, was started on metoprolol along with diltiazem and discharged home.  She has had brief episodes of palpitations, less than a minute which I suspect could be A. fib or PACs and PVCs but cannot exclude atrial  fibrillation.  I started her on Eliquis due to high CHA2DS2-VASc risk score.  She is tolerating all her medications well however blood pressure has been elevated since amlodipine was discontinued by me as she was also on diltiazem.  She was started back on amlodipine however she has developed significant leg edema.  She is also developed occasional episodes of dizzy spells and feels like she is losing her sight for a few seconds, I suspect this may be related to AV blockade in view of her advanced age.  I will discontinue diltiazem, continue amlodipine for now hypertension, I have added spironolactone 25 mg daily, will obtain a BMP in 2 weeks to 3 weeks after she starts Aldactone.  Overall she is stable from cardiac standpoint otherwise, however in view of marked bradycardia in a elderly patient,  hypertension, changes in the medication I would like to see him back in 6 weeks for follow-up.  Continue anticoagulation with Eliquis.      Adrian Prows, MD, [facility] 11/21/2021, 9:41 AM Office: [phone]

## 2021-12-17 DIAGNOSIS — E1122 Type 2 diabetes mellitus with diabetic chronic kidney disease: Secondary | ICD-10-CM | POA: Diagnosis not present

## 2021-12-17 DIAGNOSIS — E785 Hyperlipidemia, unspecified: Secondary | ICD-10-CM | POA: Diagnosis not present

## 2021-12-17 DIAGNOSIS — N1831 Chronic kidney disease, stage 3a: Secondary | ICD-10-CM | POA: Diagnosis not present

## 2021-12-17 DIAGNOSIS — I129 Hypertensive chronic kidney disease with stage 1 through stage 4 chronic kidney disease, or unspecified chronic kidney disease: Secondary | ICD-10-CM | POA: Diagnosis not present

## 2021-12-17 DIAGNOSIS — I10 Essential (primary) hypertension: Secondary | ICD-10-CM | POA: Diagnosis not present

## 2021-12-18 DIAGNOSIS — L814 Other melanin hyperpigmentation: Secondary | ICD-10-CM | POA: Diagnosis not present

## 2021-12-18 DIAGNOSIS — I788 Other diseases of capillaries: Secondary | ICD-10-CM | POA: Diagnosis not present

## 2021-12-18 DIAGNOSIS — L821 Other seborrheic keratosis: Secondary | ICD-10-CM | POA: Diagnosis not present

## 2021-12-18 DIAGNOSIS — L853 Xerosis cutis: Secondary | ICD-10-CM | POA: Diagnosis not present

## 2021-12-18 DIAGNOSIS — D225 Melanocytic nevi of trunk: Secondary | ICD-10-CM | POA: Diagnosis not present

## 2021-12-18 LAB — BASIC METABOLIC PANEL
BUN/Creatinine Ratio: 18 (ref 12–28)
BUN: 24 mg/dL (ref 8–27)
CO2: 17 mmol/L — ABNORMAL LOW (ref 20–29)
Calcium: 10.1 mg/dL (ref 8.7–10.3)
Chloride: 107 mmol/L — ABNORMAL HIGH (ref 96–106)
Creatinine, Ser: 1.3 mg/dL — ABNORMAL HIGH (ref 0.57–1.00)
Glucose: 89 mg/dL (ref 70–99)
Potassium: 4.9 mmol/L (ref 3.5–5.2)
Sodium: 146 mmol/L — ABNORMAL HIGH (ref 134–144)
eGFR: 42 mL/min/{1.73_m2} — ABNORMAL LOW (ref >59)

## 2021-12-18 NOTE — Progress Notes (Signed)
Stable S. Cr with Aldactone:  08/06/2021: BUN 27, creatinine 1.35, glucose 101, potassium 4.3, sodium 141, GFR 37

## 2022-01-03 ENCOUNTER — Other Ambulatory Visit: Payer: Self-pay

## 2022-01-03 ENCOUNTER — Encounter: Payer: Self-pay | Admitting: Cardiology

## 2022-01-03 ENCOUNTER — Ambulatory Visit: Payer: Medicare Other | Admitting: Cardiology

## 2022-01-03 ENCOUNTER — Encounter: Payer: Self-pay | Admitting: Hematology & Oncology

## 2022-01-03 VITALS — BP 135/60 | HR 50 | Temp 97.2°F | Ht 69.0 in | Wt 179.0 lb

## 2022-01-03 DIAGNOSIS — I480 Paroxysmal atrial fibrillation: Secondary | ICD-10-CM

## 2022-01-03 DIAGNOSIS — R001 Bradycardia, unspecified: Secondary | ICD-10-CM | POA: Diagnosis not present

## 2022-01-03 DIAGNOSIS — I10 Essential (primary) hypertension: Secondary | ICD-10-CM | POA: Diagnosis not present

## 2022-01-03 MED ORDER — SPIRONOLACTONE 25 MG PO TABS: 25.0000 mg | ORAL_TABLET | ORAL | 3 refills | Status: DC

## 2022-01-03 NOTE — Progress Notes (Signed)
Primary Physician/Referring:  [name], MD  Patient ID: [name], female    DOB: 04/03/1941, 81 y.o.   MRN: [id number]  Chief Complaint  Patient presents with   Atrial Fibrillation   Follow-up   Bradycardia   HPI:    TAMSIN NADER  is a 81 y.o. Caucasian female patient with hypertension, hyperlipidemia, prediabetes mellitus, had a accidental fall and had left femoral fracture in the last week of July 2022 and was admitted to [facility] and had new onset A. fib with RVR, spontaneously converted back to sinus rhythm but had a second episode, was started on metoprolol along with diltiazem and discharged home.  She has had brief episodes of palpitations, less than a minute which I suspect could be A. fib or PACs and PVCs but cannot exclude atrial fibrillation.  I started her on Eliquis due to high CHA2DS2-VASc risk score.  Her husband present.  She presents for follow-up and evaluation of bradycardia and also leg edema and hypertension.  I had started her on spironolactone which she is tolerating, leg edema has completely resolved.  She is also states that her blood pressure is not well controlled.  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   Family history of breast cancer    mother   GERD (gastroesophageal reflux disease)    Hyperlipidemia    Hypertension    Lymphedema of arm 11/24/2011   right arm   MVP (mitral valve prolapse)    PONV (postoperative nausea and vomiting)    has vertigo also-would like scop patch   Syncope 2019   after colonoscopy  states "dehydrated"   Thyroid disease    Vertigo    Past Surgical History:  Procedure Laterality Date   BREAST LUMPECTOMY     2001 right   CATARACT EXTRACTION, BILATERAL     COLONOSCOPY  2006   w/Dr.Orr   COLONOSCOPY  2019   fainted afterward at home   ESOPHAGOGASTRODUODENOSCOPY (EGD) WITH PROPOFOL N/A 08/20/2021   Procedure: ESOPHAGOGASTRODUODENOSCOPY (EGD) WITH PROPOFOL;  Surgeon: Carol Ada, MD;  [facility];  Service: Endoscopy;  Laterality: N/A;   FOOT AMPUTATION     right hammer toe   FOOT NEUROMA SURGERY     left   KNEE ARTHROSCOPY     left   MASS EXCISION  07/21/2012   Procedure: EXCISION MASS;  Surgeon: Adin Hector, MD;  Location: [place];  Service: General;  Laterality: N/A;  excision 15cm posterior neck mass posterior   MASTECTOMY     MASTECTOMY, RADICAL     2009 right   THYROIDECTOMY, PARTIAL     TOTAL HIP ARTHROPLASTY Right 06/21/2019   Procedure: TOTAL HIP ARTHROPLASTY ANTERIOR APPROACH;  Surgeon: Paralee Cancel, MD;  [facility];  Service: Orthopedics;  Laterality: Right;   TOTAL HIP ARTHROPLASTY Left 2022   UPPER GASTROINTESTINAL ENDOSCOPY     Family History  Problem Relation Age of Onset   Cancer Mother    Heart disease Mother  Heart disease Father    Heart disease Brother    Colon cancer Cousin    Stomach cancer Neg Hx    Rectal cancer Neg Hx    Pancreatic cancer Neg Hx    Esophageal cancer Neg Hx     Social History   Tobacco Use   Smoking status: Never   Smokeless tobacco: Never   Tobacco comments:    NEVER USED TOBACCO  Substance Use Topics   Alcohol use: No    Alcohol/week: 0.0 standard drinks   Marital Status: Married  ROS  Review of Systems  Cardiovascular:  Negative for chest pain, dyspnea on exertion, leg swelling and palpitations.  Musculoskeletal:  Positive for arthritis and joint pain.  Gastrointestinal:  Negative for melena.  Neurological:  Positive for  dizziness.  Objective  Blood pressure 135/60, pulse (!) 50, temperature (!) 97.2 F (36.2 C), temperature source Temporal, height '5\' 9"'  (1.753 m), weight 179 lb (81.2 kg), SpO2 100 %. Body mass index is 26.43 kg/m.  Vitals with BMI 01/03/2022 11/21/2021 08/21/2021  Height '5\' 9"'  '5\' 9"'  '5\' 9"'   Weight 179 lbs 181 lbs 13 oz 179 lbs  BMI 26.42 02.54 27.06  Systolic 237 628 315  Diastolic 60 69 69  Pulse 50 52 53     Physical Exam Neck:     Vascular: No carotid bruit or JVD.  Cardiovascular:     Rate and Rhythm: Regular rhythm. Bradycardia present.     Pulses: Intact distal pulses.     Heart sounds: Normal heart sounds. No murmur heard.   No gallop.  Pulmonary:     Effort: Pulmonary effort is normal.     Breath sounds: Normal breath sounds.  Abdominal:     General: Bowel sounds are normal.     Palpations: Abdomen is soft.  Musculoskeletal:     Right lower leg: No edema.     Left lower leg: No edema.     Laboratory examination:   Recent Labs    05/13/21 0918 12/17/21 1317  NA 141 146*  K 4.9 4.9  CL 105 107*  CO2 27 17*  GLUCOSE 118* 89  BUN 22 24  CREATININE 1.10* 1.30*  CALCIUM 10.1 10.1  GFRNONAA 51*  --    estimated creatinine clearance is 39.3 mL/min (A) (by C-G formula based on SCr of 1.3 mg/dL (H)).  CMP Latest Ref Rng & Units 12/17/2021 05/13/2021 05/10/2020  Glucose 70 - 99 mg/dL 89 118(H) 107(H)  BUN 8 - 27 mg/dL 24 22 27(H)  Creatinine 0.57 - 1.00 mg/dL 1.30(H) 1.10(H) 1.21(H)  Sodium 134 - 144 mmol/L 146(H) 141 140  Potassium 3.5 - 5.2 mmol/L 4.9 4.9 4.3  Chloride 96 - 106 mmol/L 107(H) 105 105  CO2 20 - 29 mmol/L 17(L) 27 28  Calcium 8.7 - 10.3 mg/dL 10.1 10.1 9.8  Total Protein 6.5 - 8.1 g/dL - 7.0 7.3  Total Bilirubin 0.3 - 1.2 mg/dL - 1.2 1.2  Alkaline Phos 38 - 126 U/L - 47 35(L)  AST 15 - 41 U/L - 16 14(L)  ALT 0 - 44 U/L - 10 13   CBC Latest Ref Rng & Units 05/13/2021 05/10/2020 06/24/2019  WBC 4.0 - 10.5 K/uL 5.9 6.7 8.7  Hemoglobin 12.0 - 15.0 g/dL  12.7 13.4 8.8(L)  Hematocrit 36.0 - 46.0 % 40.3 41.7 28.0(L)  Platelets 150 - 400 K/uL 160 144(L) 131(L)    External labs:   08/06/2021: HCT 37.0, Hgb 11.6, MCV 92.0, platelet 258 ALT  22, AST 17, BUN 27, creatinine 1.35, glucose 101, potassium 4.3, sodium 141, GFR 37 TSH 1.01 A1c 6.0% Vitamin B12 >2000, vitamin D 42.3   02/07/2021: TSH 1.01 Hgb 12.9, HCT 40.4, platelet 160 BUN 20, creatinine 1.2, GFR 46, potassium 6.2 Total cholesterol 160, triglycerides 129, HDL 61, LDL 73 Cholesterol, total 161.000 M 07/22/2018 HDL 70.000 mg 08/07/2020 LDL 79.000 cal 08/07/2020 Triglycerides 76.000 mg 08/07/2020  Labs 05/17/2021:  A1c 6.0%  Vitamin D 42.3.  BUN 20, creatinine 1.27, EGFR 40 mL, potassium 4.7.  Medications and allergies   Allergies  Allergen Reactions   Codeine Sulfate Swelling   Fish-Derived Products Other (See Comments)    Cellulitis    Rocephin [Ceftriaxone Sodium] Other (See Comments)    Low blood count   Sulfa Antibiotics Other (See Comments)    Mouth and lip sores      Medication list after today's encounter    Current Outpatient Medications:    ALPRAZolam (XANAX) 0.5 MG tablet, Take 0.5 mg by mouth daily as needed for anxiety. , Disp: , Rfl:    amLODipine (NORVASC) 5 MG tablet, Take 5 mg by mouth daily., Disp: , Rfl:    amoxicillin (AMOXIL) 500 MG tablet, Take 2,000 mg by mouth once., Disp: , Rfl:    apixaban (ELIQUIS) 5 MG TABS tablet, Take 1 tablet (5 mg total) by mouth 2 (two) times daily., Disp: 200 tablet, Rfl: 3   fluticasone (FLONASE) 50 MCG/ACT nasal spray, Place 1-2 sprays into both nostrils daily as needed for allergies or rhinitis., Disp: , Rfl:    losartan (COZAAR) 100 MG tablet, Take 100 mg by mouth daily. , Disp: , Rfl:    metoprolol succinate (TOPROL-XL) 50 MG 24 hr tablet, Take 50 mg by mouth daily., Disp: , Rfl:    nitrofurantoin, macrocrystal-monohydrate, (MACROBID) 100 MG capsule, Take 100 mg by mouth daily., Disp: , Rfl:     omeprazole (PRILOSEC) 40 MG capsule, Take 40 mg by mouth as needed., Disp: , Rfl:    rosuvastatin (CRESTOR) 5 MG tablet, Take 5 mg by mouth daily., Disp: , Rfl: 0   Zoledronic Acid (ZOMETA IV), Inject 1 each into the skin See admin instructions. One a year, Disp: , Rfl:    spironolactone (ALDACTONE) 25 MG tablet, Take 1 tablet (25 mg total) by mouth every morning., Disp: 90 tablet, Rfl: 3  Radiology:   CT of the chest 04/28/2016: 1. No acute cardiopulmonary abnormalities. 2. Aortic atherosclerosis and multi vessel coronary artery calcification. 3. 2 mm nodule in the anterior right upper lobe is stable from previous exam and is compatible with a benign etiology.  OTHER:   Upper GI endoscopy 08/20/2021: Reflux esophagitis with no bleeding.  3 cm hiatal hernia.  Colonoscopy performed on 05/25/2019 revealing 2 1 to 2 mm polyps and mild diverticulosis and nonbleeding internal hemorrhoids.  Cardiac Studies:  [facility], [place], [place]  Echocardiogram 07/16/2021: Normal LV size and normal LV systolic function, EF 94%.  Mild LVH. Normal RV systolic function and normal size. Trivial pericardial effusion.  EKG:    EKG 01/03/2022: Sinus bradycardia at rate of 50 bpm, incomplete right bundle branch block.  No significant change from 11/21/2021, heart rate was 50 bpm as well.    EKG 08/15/2021: Atrial fibrillation with rapid ventricular response at rate of 159 bpm.  Poor R wave progression.  Low-voltage complexes.  Nonspecific T abnormality.  Assessment     ICD-10-CM   1. Paroxysmal atrial fibrillation (HCC)  I48.0 EKG 12-Lead  2. Bradycardia by electrocardiogram  R00.1     3. Primary hypertension  I10 spironolactone (ALDACTONE) 25 MG tablet       CHA2DS2-VASc Score is 6.  Yearly risk of stroke: 9.8% (A, F, HTN, Pre DM, Coronary atherosclerosis).  Score of 1=0.6; 2=2.2; 3=3.2; 4=4.8; 5=7.2; 6=9.8; 7=>9.8) -(CHF; HTN; vasc disease DM,  Female = 1; Age <65 =0; 65-74 = 1,  >75 =2;  stroke/embolism= 2).    Medications Discontinued During This Encounter  Medication Reason   spironolactone (ALDACTONE) 25 MG tablet Reorder    Meds ordered this encounter  Medications   spironolactone (ALDACTONE) 25 MG tablet    Sig: Take 1 tablet (25 mg total) by mouth every morning.    Dispense:  90 tablet    Refill:  3   Orders Placed This Encounter  Procedures   EKG 12-Lead   Recommendations:   MURIELLE STANG is a 81 y.o. Caucasian female patient with hypertension, hyperlipidemia, prediabetes mellitus, had a accidental fall and had left femoral fracture in the last week of July 2022 and was admitted to [facility] and had new onset A. fib with RVR, spontaneously converted back to sinus rhythm but had a second episode, was started on metoprolol along with diltiazem and discharged home.  She has had brief episodes of palpitations, less than a minute which I suspect could be A. fib or PACs and PVCs but cannot exclude atrial fibrillation.  I started her on Eliquis due to high CHA2DS2-VASc risk score.  On her last office visit due to leg edema and also uncontrolled hypertension in spite of stage IIIa chronic kidney disease, it started her on spironolactone which she is tolerating.  Repeat labs have been normal/stable renal function.  Has completely resolved.  Patient is interested in being screened for  OCEANIC-af TRIAL, factor XI inhibitor,  Asundexian vs Eliquis.  I have given her written instructions regarding avoiding taking spironolactone and losartan if there is any condition that leads to dehydration.    Adrian Prows, MD, [facility] 01/03/2022, 9:17 AM Office: [phone]

## 2022-01-14 DIAGNOSIS — E785 Hyperlipidemia, unspecified: Secondary | ICD-10-CM | POA: Diagnosis not present

## 2022-01-14 DIAGNOSIS — N1831 Chronic kidney disease, stage 3a: Secondary | ICD-10-CM | POA: Diagnosis not present

## 2022-01-14 DIAGNOSIS — E1122 Type 2 diabetes mellitus with diabetic chronic kidney disease: Secondary | ICD-10-CM | POA: Diagnosis not present

## 2022-01-14 DIAGNOSIS — I129 Hypertensive chronic kidney disease with stage 1 through stage 4 chronic kidney disease, or unspecified chronic kidney disease: Secondary | ICD-10-CM | POA: Diagnosis not present

## 2022-01-29 DIAGNOSIS — H0100B Unspecified blepharitis left eye, upper and lower eyelids: Secondary | ICD-10-CM | POA: Diagnosis not present

## 2022-01-29 DIAGNOSIS — H26491 Other secondary cataract, right eye: Secondary | ICD-10-CM | POA: Diagnosis not present

## 2022-01-29 DIAGNOSIS — H0100A Unspecified blepharitis right eye, upper and lower eyelids: Secondary | ICD-10-CM | POA: Diagnosis not present

## 2022-02-06 DIAGNOSIS — N3020 Other chronic cystitis without hematuria: Secondary | ICD-10-CM | POA: Diagnosis not present

## 2022-02-06 DIAGNOSIS — R350 Frequency of micturition: Secondary | ICD-10-CM | POA: Diagnosis not present

## 2022-02-10 DIAGNOSIS — E559 Vitamin D deficiency, unspecified: Secondary | ICD-10-CM | POA: Diagnosis not present

## 2022-02-10 DIAGNOSIS — Z1231 Encounter for screening mammogram for malignant neoplasm of breast: Secondary | ICD-10-CM | POA: Diagnosis not present

## 2022-02-10 DIAGNOSIS — E7800 Pure hypercholesterolemia, unspecified: Secondary | ICD-10-CM | POA: Diagnosis not present

## 2022-02-10 DIAGNOSIS — E1122 Type 2 diabetes mellitus with diabetic chronic kidney disease: Secondary | ICD-10-CM | POA: Diagnosis not present

## 2022-02-10 DIAGNOSIS — R300 Dysuria: Secondary | ICD-10-CM | POA: Diagnosis not present

## 2022-02-10 DIAGNOSIS — E538 Deficiency of other specified B group vitamins: Secondary | ICD-10-CM | POA: Diagnosis not present

## 2022-02-10 DIAGNOSIS — Z0000 Encounter for general adult medical examination without abnormal findings: Secondary | ICD-10-CM | POA: Diagnosis not present

## 2022-02-11 DIAGNOSIS — N760 Acute vaginitis: Secondary | ICD-10-CM | POA: Diagnosis not present

## 2022-02-14 DIAGNOSIS — N1831 Chronic kidney disease, stage 3a: Secondary | ICD-10-CM | POA: Diagnosis not present

## 2022-02-14 DIAGNOSIS — E1122 Type 2 diabetes mellitus with diabetic chronic kidney disease: Secondary | ICD-10-CM | POA: Diagnosis not present

## 2022-02-14 DIAGNOSIS — I129 Hypertensive chronic kidney disease with stage 1 through stage 4 chronic kidney disease, or unspecified chronic kidney disease: Secondary | ICD-10-CM | POA: Diagnosis not present

## 2022-02-14 DIAGNOSIS — E785 Hyperlipidemia, unspecified: Secondary | ICD-10-CM | POA: Diagnosis not present

## 2022-02-17 DIAGNOSIS — I4891 Unspecified atrial fibrillation: Secondary | ICD-10-CM | POA: Diagnosis not present

## 2022-02-17 DIAGNOSIS — I10 Essential (primary) hypertension: Secondary | ICD-10-CM | POA: Diagnosis not present

## 2022-02-17 DIAGNOSIS — E785 Hyperlipidemia, unspecified: Secondary | ICD-10-CM | POA: Diagnosis not present

## 2022-02-17 DIAGNOSIS — Z853 Personal history of malignant neoplasm of breast: Secondary | ICD-10-CM | POA: Diagnosis not present

## 2022-02-17 DIAGNOSIS — M810 Age-related osteoporosis without current pathological fracture: Secondary | ICD-10-CM | POA: Diagnosis not present

## 2022-02-17 DIAGNOSIS — E538 Deficiency of other specified B group vitamins: Secondary | ICD-10-CM | POA: Diagnosis not present

## 2022-02-17 DIAGNOSIS — Z0000 Encounter for general adult medical examination without abnormal findings: Secondary | ICD-10-CM | POA: Diagnosis not present

## 2022-02-17 DIAGNOSIS — E1122 Type 2 diabetes mellitus with diabetic chronic kidney disease: Secondary | ICD-10-CM | POA: Diagnosis not present

## 2022-02-17 DIAGNOSIS — Z23 Encounter for immunization: Secondary | ICD-10-CM | POA: Diagnosis not present

## 2022-02-24 DIAGNOSIS — M1712 Unilateral primary osteoarthritis, left knee: Secondary | ICD-10-CM | POA: Diagnosis not present

## 2022-02-24 DIAGNOSIS — M25562 Pain in left knee: Secondary | ICD-10-CM | POA: Diagnosis not present

## 2022-03-20 DIAGNOSIS — H35371 Puckering of macula, right eye: Secondary | ICD-10-CM | POA: Diagnosis not present

## 2022-03-20 DIAGNOSIS — H26491 Other secondary cataract, right eye: Secondary | ICD-10-CM | POA: Diagnosis not present

## 2022-03-20 DIAGNOSIS — Z961 Presence of intraocular lens: Secondary | ICD-10-CM | POA: Diagnosis not present

## 2022-04-09 DIAGNOSIS — L570 Actinic keratosis: Secondary | ICD-10-CM | POA: Diagnosis not present

## 2022-05-13 ENCOUNTER — Other Ambulatory Visit: Payer: Self-pay

## 2022-05-13 ENCOUNTER — Encounter: Payer: Self-pay | Admitting: Family

## 2022-05-13 ENCOUNTER — Inpatient Hospital Stay (HOSPITAL_BASED_OUTPATIENT_CLINIC_OR_DEPARTMENT_OTHER): Payer: Medicare Other | Admitting: Family

## 2022-05-13 ENCOUNTER — Inpatient Hospital Stay: Payer: Medicare Other

## 2022-05-13 ENCOUNTER — Inpatient Hospital Stay: Payer: Medicare Other | Attending: Hematology & Oncology

## 2022-05-13 VITALS — BP 145/42 | HR 45 | Temp 97.7°F | Resp 20 | Ht 69.0 in | Wt 174.1 lb

## 2022-05-13 DIAGNOSIS — C50011 Malignant neoplasm of nipple and areola, right female breast: Secondary | ICD-10-CM

## 2022-05-13 DIAGNOSIS — M818 Other osteoporosis without current pathological fracture: Secondary | ICD-10-CM

## 2022-05-13 DIAGNOSIS — I890 Lymphedema, not elsewhere classified: Secondary | ICD-10-CM | POA: Diagnosis not present

## 2022-05-13 DIAGNOSIS — C50511 Malignant neoplasm of lower-outer quadrant of right female breast: Secondary | ICD-10-CM | POA: Insufficient documentation

## 2022-05-13 DIAGNOSIS — Z7983 Long term (current) use of bisphosphonates: Secondary | ICD-10-CM | POA: Diagnosis not present

## 2022-05-13 DIAGNOSIS — C50911 Malignant neoplasm of unspecified site of right female breast: Secondary | ICD-10-CM | POA: Diagnosis not present

## 2022-05-13 DIAGNOSIS — E559 Vitamin D deficiency, unspecified: Secondary | ICD-10-CM | POA: Diagnosis not present

## 2022-05-13 DIAGNOSIS — T50905A Adverse effect of unspecified drugs, medicaments and biological substances, initial encounter: Secondary | ICD-10-CM

## 2022-05-13 DIAGNOSIS — Z170 Estrogen receptor positive status [ER+]: Secondary | ICD-10-CM | POA: Diagnosis not present

## 2022-05-13 LAB — CMP (CANCER CENTER ONLY)
ALT: 7 U/L (ref 0–44)
AST: 14 U/L — ABNORMAL LOW (ref 15–41)
Albumin: 4.4 g/dL (ref 3.5–5.0)
Alkaline Phosphatase: 39 U/L (ref 38–126)
Anion gap: 8 (ref 5–15)
BUN: 24 mg/dL — ABNORMAL HIGH (ref 8–23)
CO2: 26 mmol/L (ref 22–32)
Calcium: 10 mg/dL (ref 8.9–10.3)
Chloride: 108 mmol/L (ref 98–111)
Creatinine: 1.3 mg/dL — ABNORMAL HIGH (ref 0.44–1.00)
GFR, Estimated: 42 mL/min — ABNORMAL LOW (ref >60)
Glucose, Bld: 116 mg/dL — ABNORMAL HIGH (ref 70–99)
Potassium: 4.4 mmol/L (ref 3.5–5.1)
Sodium: 142 mmol/L (ref 135–145)
Total Bilirubin: 1.2 mg/dL (ref 0.3–1.2)
Total Protein: 7.1 g/dL (ref 6.5–8.1)

## 2022-05-13 LAB — CBC WITH DIFFERENTIAL (CANCER CENTER ONLY)
Abs Immature Granulocytes: 0.03 10*3/uL (ref 0.00–0.07)
Basophils Absolute: 0.1 10*3/uL (ref 0.0–0.1)
Basophils Relative: 1 %
Eosinophils Absolute: 0.1 10*3/uL (ref 0.0–0.5)
Eosinophils Relative: 2 %
HCT: 37.6 % (ref 36.0–46.0)
Hemoglobin: 12.1 g/dL (ref 12.0–15.0)
Immature Granulocytes: 1 %
Lymphocytes Relative: 35 %
Lymphs Abs: 2 10*3/uL (ref 0.7–4.0)
MCH: 30.2 pg (ref 26.0–34.0)
MCHC: 32.2 g/dL (ref 30.0–36.0)
MCV: 93.8 fL (ref 80.0–100.0)
Monocytes Absolute: 0.4 10*3/uL (ref 0.1–1.0)
Monocytes Relative: 7 %
Neutro Abs: 3.1 10*3/uL (ref 1.7–7.7)
Neutrophils Relative %: 54 %
Platelet Count: 168 10*3/uL (ref 150–400)
RBC: 4.01 MIL/uL (ref 3.87–5.11)
RDW: 12.9 % (ref 11.5–15.5)
WBC Count: 5.8 10*3/uL (ref 4.0–10.5)
nRBC: 0 % (ref 0.0–0.2)

## 2022-05-13 LAB — VITAMIN D 25 HYDROXY (VIT D DEFICIENCY, FRACTURES): Vit D, 25-Hydroxy: 41.03 ng/mL (ref 30–100)

## 2022-05-13 MED ORDER — SODIUM CHLORIDE 0.9 % IV SOLN: Freq: Once | INTRAVENOUS | Status: AC

## 2022-05-13 MED ORDER — AZITHROMYCIN 250 MG PO TABS: ORAL_TABLET | ORAL | 3 refills | Status: DC

## 2022-05-13 MED ORDER — ZOLEDRONIC ACID 4 MG/100ML IV SOLN
4.0000 mg | Freq: Once | INTRAVENOUS | Status: AC
  Administered 2022-05-13: 4 mg via INTRAVENOUS
  Filled 2022-05-13: qty 100

## 2022-05-16 ENCOUNTER — Encounter: Payer: Self-pay | Admitting: Hematology & Oncology

## 2022-05-19 DIAGNOSIS — M810 Age-related osteoporosis without current pathological fracture: Secondary | ICD-10-CM | POA: Diagnosis not present

## 2022-05-19 DIAGNOSIS — E538 Deficiency of other specified B group vitamins: Secondary | ICD-10-CM | POA: Diagnosis not present

## 2022-05-19 DIAGNOSIS — R300 Dysuria: Secondary | ICD-10-CM | POA: Diagnosis not present

## 2022-05-19 DIAGNOSIS — E1122 Type 2 diabetes mellitus with diabetic chronic kidney disease: Secondary | ICD-10-CM | POA: Diagnosis not present

## 2022-05-23 DIAGNOSIS — I10 Essential (primary) hypertension: Secondary | ICD-10-CM | POA: Diagnosis not present

## 2022-05-27 DIAGNOSIS — N390 Urinary tract infection, site not specified: Secondary | ICD-10-CM | POA: Diagnosis not present

## 2022-06-03 ENCOUNTER — Encounter: Payer: Self-pay | Admitting: Podiatry

## 2022-06-03 ENCOUNTER — Ambulatory Visit (INDEPENDENT_AMBULATORY_CARE_PROVIDER_SITE_OTHER): Payer: Medicare Other | Admitting: Podiatry

## 2022-06-03 DIAGNOSIS — B351 Tinea unguium: Secondary | ICD-10-CM

## 2022-06-03 DIAGNOSIS — M79675 Pain in left toe(s): Secondary | ICD-10-CM

## 2022-06-03 DIAGNOSIS — M79674 Pain in right toe(s): Secondary | ICD-10-CM | POA: Diagnosis not present

## 2022-06-03 DIAGNOSIS — D689 Coagulation defect, unspecified: Secondary | ICD-10-CM

## 2022-06-03 NOTE — Progress Notes (Signed)
  Subjective:  Patient ID: Jennifer Yang, female    DOB: 05-Mar-1941,   MRN: [id number]  Chief Complaint  Patient presents with   Nail Problem    Routine foot care. Nail trim and callous debridement    81 y.o. female presents for concern of thickened elongated and painful nails that are difficult to trim. Requesting to have them trimmed today. Relates burning and tingling in their feet. Patient is not idabetic but is on eliquis  PCP:  [name], MD    . Denies any other pedal complaints. Denies n/v/f/c.   Past Medical History:  Diagnosis Date   Allergy    shell fish   Anxiety    takes xanax on a rare occasion   Arthritis    knees   Benign essential HTN 11/24/2011   Bladder infection 03/18/15   completed antibotics    Breast cancer, right breast ([place]) 02/16/2008   Breast cancer, stage 1 ([place]) 07/16/1990   Cancer ([place])    right Chemo/radiation '01/radical '09   Cataract    Contact lens/glasses fitting    Drug-induced osteoporosis 11/25/2011   Family history of breast cancer    mother   GERD (gastroesophageal reflux disease)    Hyperlipidemia    Hypertension    Lymphedema of arm 11/24/2011   right arm   MVP (mitral valve prolapse)    PONV (postoperative nausea and vomiting)    has vertigo also-would like scop patch   Syncope 2019   after colonoscopy states "dehydrated"   Thyroid disease    Vertigo     Objective:  Physical Exam: Vascular: DP/PT pulses 2/4 bilateral. CFT <3 seconds. Absent hair growth on digits. Edema noted to bilateral lower extremities. Xerosis noted bilaterally.  Skin. No lacerations or abrasions bilateral feet. Nails 1-5 bilateral  are thickened discolored and elongated with subungual debris.  Musculoskeletal: MMT 5/5 bilateral lower extremities in DF, PF, Inversion and Eversion. Deceased ROM in DF of ankle joint.  Neurological: Sensation intact to light touch. Protective sensation diminished bilateral.    Assessment:   1. Pain due to onychomycosis of  toenails of both feet   2. Coagulation defect ([place])      Plan:  Patient was evaluated and treated and all questions answered. -Discussed and educated patient on diabetic foot care, especially with  regards to the vascular, neurological and musculoskeletal systems.  -Stressed the importance of good glycemic control and the detriment of not  controlling glucose levels in relation to the foot. -Discussed supportive shoes at all times and checking feet regularly.  -Mechanically debrided all nails 1-5 bilateral using sterile nail nipper and filed with dremel without incident  -Answered all patient questions -Patient to return  in 3 months for at risk foot care -Patient advised to call the office if any problems or questions arise in the meantime.   Lorenda Peck, DPM

## 2022-06-04 DIAGNOSIS — N3020 Other chronic cystitis without hematuria: Secondary | ICD-10-CM | POA: Diagnosis not present

## 2022-06-04 DIAGNOSIS — R350 Frequency of micturition: Secondary | ICD-10-CM | POA: Diagnosis not present

## 2022-06-04 DIAGNOSIS — M25562 Pain in left knee: Secondary | ICD-10-CM | POA: Diagnosis not present

## 2022-06-10 NOTE — Progress Notes (Unsigned)
    Subjective:    CC: R knee pain  I, Molly Weber, LAT, ATC, am serving as scribe for Dr. Lynne Leader.  HPI: Pt is an 81 y/o female presenting w/ c/o R knee pain x .  She locates her pain to .  R knee swelling: R knee mechanical symptoms: Aggravating factors: Treatments tried:   Pertinent review of Systems: ***  Relevant historical information: ***   Objective:   There were no vitals filed for this visit. General: Well Developed, well nourished, and in no acute distress.   MSK: ***  Lab and Radiology Results No results found for this or any previous visit (from the past 72 hour(s)). No results found.    Impression and Recommendations:    Assessment and Plan: 81 y.o. female with ***.  PDMP not reviewed this encounter. No orders of the defined types were placed in this encounter.  No orders of the defined types were placed in this encounter.   Discussed warning signs or symptoms. Please see discharge instructions. Patient expresses understanding.   ***

## 2022-06-11 ENCOUNTER — Ambulatory Visit (INDEPENDENT_AMBULATORY_CARE_PROVIDER_SITE_OTHER): Payer: Medicare Other | Admitting: Family Medicine

## 2022-06-11 ENCOUNTER — Encounter: Payer: Self-pay | Admitting: Family Medicine

## 2022-06-11 ENCOUNTER — Ambulatory Visit: Payer: Self-pay

## 2022-06-11 VITALS — BP 100/60 | HR 41 | Ht 69.0 in | Wt 174.1 lb

## 2022-06-11 DIAGNOSIS — M25562 Pain in left knee: Secondary | ICD-10-CM

## 2022-06-11 DIAGNOSIS — G8929 Other chronic pain: Secondary | ICD-10-CM | POA: Diagnosis not present

## 2022-06-11 DIAGNOSIS — M1712 Unilateral primary osteoarthritis, left knee: Secondary | ICD-10-CM | POA: Diagnosis not present

## 2022-06-11 DIAGNOSIS — N3020 Other chronic cystitis without hematuria: Secondary | ICD-10-CM | POA: Diagnosis not present

## 2022-06-11 NOTE — Patient Instructions (Addendum)
Nice to meet you today.  I've referred you to PT at [facility].  Their office will call you to schedule but please let us know if you don't hear from them in one week regarding scheduling.  We will also work on Public librarian authorized for your L knee.  Someone from my office will contact you once we hear back from your insurance.  Follow-up: one month

## 2022-06-16 ENCOUNTER — Encounter: Payer: Self-pay | Admitting: Physical Therapy

## 2022-06-16 ENCOUNTER — Telehealth: Payer: Self-pay | Admitting: Family Medicine

## 2022-06-16 ENCOUNTER — Ambulatory Visit (INDEPENDENT_AMBULATORY_CARE_PROVIDER_SITE_OTHER): Payer: Medicare Other | Admitting: Physical Therapy

## 2022-06-16 DIAGNOSIS — M25561 Pain in right knee: Secondary | ICD-10-CM

## 2022-06-16 DIAGNOSIS — G8929 Other chronic pain: Secondary | ICD-10-CM

## 2022-06-16 DIAGNOSIS — M25562 Pain in left knee: Secondary | ICD-10-CM | POA: Diagnosis not present

## 2022-06-16 NOTE — Therapy (Signed)
OUTPATIENT PHYSICAL THERAPY LOWER EXTREMITY EVALUATION   Patient Name: Jennifer Yang MRN: [id number] DOB:07-21-1941, 81 y.o., female Today's Date: 06/16/2022    Past Medical History:  Diagnosis Date   Allergy    shell fish   Anxiety    takes xanax on a rare occasion   Arthritis    knees   Benign essential HTN 11/24/2011   Bladder infection 03/18/15   completed antibotics    Breast cancer, right breast ([place]) 02/16/2008   Breast cancer, stage 1 ([place]) 07/16/1990   Cancer ([place])    right Chemo/radiation '01/radical '09   Cataract    Contact lens/glasses fitting    Drug-induced osteoporosis 11/25/2011   Family history of breast cancer    mother   GERD (gastroesophageal reflux disease)    Hyperlipidemia    Hypertension    Lymphedema of arm 11/24/2011   right arm   MVP (mitral valve prolapse)    PONV (postoperative nausea and vomiting)    has vertigo also-would like scop patch   Syncope 2019   after colonoscopy states "dehydrated"   Thyroid disease    Vertigo    Past Surgical History:  Procedure Laterality Date   BREAST LUMPECTOMY     2001 right   CATARACT EXTRACTION, BILATERAL     COLONOSCOPY  2006   w/Dr.Orr   COLONOSCOPY  2019   fainted afterward at home   ESOPHAGOGASTRODUODENOSCOPY (EGD) WITH PROPOFOL N/A 08/20/2021   Procedure: ESOPHAGOGASTRODUODENOSCOPY (EGD) WITH PROPOFOL;  Surgeon: Carol Ada, MD;  Location: [facility];  Service: Endoscopy;  Laterality: N/A;   FOOT AMPUTATION     right hammer toe   FOOT NEUROMA SURGERY     left   KNEE ARTHROSCOPY     left   MASS EXCISION  07/21/2012   Procedure: EXCISION MASS;  Surgeon: Adin Hector, MD;  Location: [facility];  Service: General;  Laterality: N/A;  excision 15cm posterior neck mass posterior   MASTECTOMY     MASTECTOMY, RADICAL     2009 right   THYROIDECTOMY, PARTIAL     TOTAL HIP ARTHROPLASTY Right 06/21/2019   Procedure: TOTAL HIP ARTHROPLASTY ANTERIOR APPROACH;  Surgeon: Paralee Cancel,  MD;  Location: [facility];  Service: Orthopedics;  Laterality: Right;   TOTAL HIP ARTHROPLASTY Left 2022   UPPER GASTROINTESTINAL ENDOSCOPY     Patient Active Problem List   Diagnosis Date Noted   Acute blood loss anemia 06/23/2019   Fall at home, initial encounter 06/23/2019   Hypothyroidism 06/21/2019   Thrombocytopenia ([place]) 06/21/2019   Right femoral fracture ([place]) 06/20/2019   Solitary pulmonary nodule 03/22/2015   Gaseous abdominal distention 03/22/2015   Near syncope 03/21/2015   Orthostatic hypotension 03/21/2015   Dyslipidemia 03/21/2015   Lipoma of neck 07/28/2012   Drug-induced osteoporosis 11/25/2011   Lymphedema of arm 11/24/2011   Goiter, 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  Rationale for Evaluation and Treatment Rehabilitation  ONSET DATE: ***  SUBJECTIVE:   SUBJECTIVE STATEMENT: Pain in L knee, longstanding. She has had cortisone and gel shots in the past. Does not feel that much has helped. Supposed to be having different kind of injection.  Difficulty with initial standing activity, stairs, Has  3-4 steps to enter, 2 hand rails.   PERTINENT HISTORY: A-Fib, Previous breast CA, Osteoporosis,   PAIN:  Are you having pain? Yes: NPRS scale: 5-8 /10 Pain location: L knee  Pain description: achey Aggravating factors: first thing in AM, initial standing activity.  Relieving factors: none stated   PRECAUTIONS: None  WEIGHT BEARING RESTRICTIONS No  FALLS:  Has patient fallen in last 6 months? No Has fallen 2x/previous years, and has had 2 hip fx.    PLOF: Independent  PATIENT GOALS   avoid knee replacement,    OBJECTIVE:   DIAGNOSTIC FINDINGS:    COGNITION:  Overall cognitive  status: Within functional limits for tasks assessed    POSTURE:  L knee significant valgus L>R.  PALPATION:      LOWER EXTREMITY ROM:  Active ROM Right eval Left eval  Hip flexion    Hip extension    Hip abduction    Hip adduction    Hip internal rotation    Hip external rotation    Knee flexion 120 115  Knee extension 0 -3  Ankle dorsiflexion    Ankle plantarflexion    Ankle inversion    Ankle eversion     (Blank rows = not tested)  LOWER EXTREMITY MMT:  MMT Right eval Left eval  Hip flexion    Hip extension    Hip abduction    Hip adduction    Hip internal rotation    Hip external rotation    Knee flexion    Knee extension    Ankle dorsiflexion    Ankle plantarflexion    Ankle inversion    Ankle eversion     (Blank rows = not tested)  LOWER EXTREMITY SPECIAL TESTS:   FUNCTIONAL TESTS:  {Functional tests:24029}  GAIT: Distance walked: *** Assistive device utilized: {Assistive devices:23999} Level of assistance: {Levels of assistance:24026} Comments: ***    TODAY'S TREATMENT: ***   PATIENT EDUCATION:  Education details: PT POC, Exam findings.  Person educated: {Person educated:25204} Education method: {Education Method:25205} Education comprehension: {Education Comprehension:25206}   HOME EXERCISE PROGRAM: Access Code: [email or web address] URL: [email or web address] Date: 06/16/2022 Prepared by: Lyndee Hensen  Exercises - Supine Quadricep Sets  - 2 x daily - 1- 2 sets - 10 reps - Straight Leg Raise  - 1-2 x daily - 1- 2 sets - 10 reps - Supine Heel Slide  - 2 x daily - 1- 2 sets - 10 reps    ASSESSMENT:  CLINICAL IMPRESSION: Patient is a *** y.o. *** who was seen today for physical therapy evaluation and treatment for ***.    OBJECTIVE IMPAIRMENTS {opptimpairments:25111}.   ACTIVITY LIMITATIONS {activitylimitations:27494}  PARTICIPATION LIMITATIONS: {participationrestrictions:25113}  PERSONAL FACTORS {Personal  factors:25162} are also affecting patient's functional outcome.   REHAB POTENTIAL: {rehabpotential:25112}  CLINICAL DECISION MAKING: {clinical decision making:25114}  EVALUATION COMPLEXITY: {Evaluation complexity:25115}   GOALS: Goals reviewed with patient? {yes/no:20286}  SHORT TERM GOALS: Target date: {follow up:25551}  *** Baseline: Goal status: {GOALSTATUS:25110}  2.  *** Baseline:  Goal status: {GOALSTATUS:25110}  3.  *** Baseline:  Goal status: {GOALSTATUS:25110}  4.  *** Baseline:  Goal status: {GOALSTATUS:25110}  5.  *** Baseline:  Goal status: {GOALSTATUS:25110}  6.  *** Baseline:  Goal status: {GOALSTATUS:25110}  LONG TERM GOALS: Target date: {follow up:25551}   *** Baseline:  Goal status: {GOALSTATUS:25110}  2.  *** Baseline:  Goal status: {GOALSTATUS:25110}  3.  *** Baseline:  Goal status: {GOALSTATUS:25110}  4.  *** Baseline:  Goal status: {GOALSTATUS:25110}  5.  ***  Baseline:  Goal status: {GOALSTATUS:25110}  6.  *** Baseline:  Goal status: {GOALSTATUS:25110}   PLAN: PT FREQUENCY: 1-2x/week  PT DURATION: 8 weeks  PLANNED INTERVENTIONS: {rehab planned interventions:25118::"Therapeutic exercises","Therapeutic activity","Neuromuscular re-education","Balance training","Gait training","Patient/Family education","Self Care","Joint mobilization"}  PLAN FOR NEXT SESSION:   Lyndee Hensen, PT, DPT 8:09 AM  06/16/22

## 2022-06-16 NOTE — Telephone Encounter (Signed)
Received medical records from [facility].  Patient has had several steroid injections.  I do not see any gel shots listed.

## 2022-06-18 ENCOUNTER — Encounter: Payer: Self-pay | Admitting: Physical Therapy

## 2022-06-18 ENCOUNTER — Ambulatory Visit (INDEPENDENT_AMBULATORY_CARE_PROVIDER_SITE_OTHER): Payer: Medicare Other | Admitting: Physical Therapy

## 2022-06-18 DIAGNOSIS — G8929 Other chronic pain: Secondary | ICD-10-CM

## 2022-06-18 DIAGNOSIS — M25561 Pain in right knee: Secondary | ICD-10-CM | POA: Diagnosis not present

## 2022-06-18 DIAGNOSIS — M25562 Pain in left knee: Secondary | ICD-10-CM | POA: Diagnosis not present

## 2022-06-18 NOTE — Therapy (Signed)
OUTPATIENT PHYSICAL THERAPY LOWER EXTREMITY TREATMENT   Patient Name: Jennifer Yang MRN: [id number] DOB:1941/06/30, 81 y.o., female Today's Date: 06/18/2022   PT End of Session - 06/18/22 1412     Visit Number 2    Number of Visits 12    Date for PT Re-Evaluation 07/28/22    Authorization Type Medicare    PT Start Time 0805    PT Stop Time 0845    PT Time Calculation (min) 40 min    Activity Tolerance Patient tolerated treatment well    Behavior During Therapy [facility] for tasks assessed/performed             Past Medical History:  Diagnosis Date   Allergy    shell fish   Anxiety    takes xanax on a rare occasion   Arthritis    knees   Benign essential HTN 11/24/2011   Bladder infection 03/18/15   completed antibotics    Breast cancer, right breast ([place]) 02/16/2008   Breast cancer, stage 1 ([place]) 07/16/1990   Cancer ([place])    right Chemo/radiation '01/radical '09   Cataract    Contact lens/glasses fitting    Drug-induced osteoporosis 11/25/2011   Family history of breast cancer    mother   GERD (gastroesophageal reflux disease)    Hyperlipidemia    Hypertension    Lymphedema of arm 11/24/2011   right arm   MVP (mitral valve prolapse)    PONV (postoperative nausea and vomiting)    has vertigo also-would like scop patch   Syncope 2019   after colonoscopy states "dehydrated"   Thyroid disease    Vertigo    Past Surgical History:  Procedure Laterality Date   BREAST LUMPECTOMY     2001 right   CATARACT EXTRACTION, BILATERAL     COLONOSCOPY  2006   w/Dr.Orr   COLONOSCOPY  2019   fainted afterward at home   ESOPHAGOGASTRODUODENOSCOPY (EGD) WITH PROPOFOL N/A 08/20/2021   Procedure: ESOPHAGOGASTRODUODENOSCOPY (EGD) WITH PROPOFOL;  Surgeon: Carol Ada, MD;  [facility];  Service: Endoscopy;  Laterality: N/A;   FOOT AMPUTATION     right hammer toe   FOOT NEUROMA SURGERY     left   KNEE ARTHROSCOPY     left   MASS EXCISION  07/21/2012   Procedure: EXCISION  MASS;  Surgeon: Adin Hector, MD;  Location: [facility];  Service: General;  Laterality: N/A;  excision 15cm posterior neck mass posterior   MASTECTOMY     MASTECTOMY, RADICAL     2009 right   THYROIDECTOMY, PARTIAL     TOTAL HIP ARTHROPLASTY Right 06/21/2019   Procedure: TOTAL HIP ARTHROPLASTY ANTERIOR APPROACH;  Surgeon: Paralee Cancel, MD;  [facility];  Service: Orthopedics;  Laterality: Right;   TOTAL HIP ARTHROPLASTY Left 2022   UPPER GASTROINTESTINAL ENDOSCOPY     Patient Active Problem List   Diagnosis Date Noted   Acute blood loss anemia 06/23/2019   Fall at home, initial encounter 06/23/2019   Hypothyroidism 06/21/2019   Thrombocytopenia ([place]) 06/21/2019   Right femoral fracture ([place]) 06/20/2019   Solitary pulmonary nodule 03/22/2015   Gaseous abdominal distention 03/22/2015   Near syncope 03/21/2015   Orthostatic hypotension 03/21/2015   Dyslipidemia 03/21/2015   Lipoma of neck 07/28/2012   Drug-induced osteoporosis 11/25/2011   Lymphedema of arm 11/24/2011   Goiter, 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   Eval: Pain in L knee, longstanding. She has had cortisone and gel shots in the past. Does not feel that much has helped. Supposed to be having different kind of injection.  Difficulty with initial standing activity, stairs, Has 3-4 steps to enter, 2 hand rails.  L>R bil Pt would like to avoid having knee replacement. She has had 2 hip replacements in the past.   PERTINENT  HISTORY: A-Fib, Previous breast CA, Osteoporosis, bil THA,   PAIN:  Are you having pain? Yes: NPRS scale: 5-8 /10 Pain location: L knee  Pain description: achey Aggravating factors: first thing in AM, initial standing activity.  Relieving factors: none stated   PRECAUTIONS: None  WEIGHT BEARING RESTRICTIONS No  FALLS:  Has patient fallen in last 6 months? No Has fallen 2x/previous years, and has had 2 hip fx.    PLOF: Independent  PATIENT GOALS   avoid knee replacement,    OBJECTIVE:   DIAGNOSTIC FINDINGS:    COGNITION:  Overall cognitive status: Within functional limits for tasks assessed    POSTURE:  L knee significant valgus L>R.  PALPATION:   Tenderness in joint line L>R   LOWER EXTREMITY ROM    Hip ROM: WFL   Active ROM Right eval Left eval  Hip flexion    Hip extension    Hip abduction    Hip adduction    Hip internal rotation    Hip external rotation    Knee flexion 120 115  Knee extension 0 -3  Ankle dorsiflexion    Ankle plantarflexion    Ankle inversion    Ankle eversion     (Blank rows = not tested)  LOWER EXTREMITY MMT:  MMT Right eval Left eval  Hip flexion 4 4  Hip extension    Hip abduction 4- 4-  Hip adduction    Hip internal rotation 4 4  Hip external rotation 4 4  Knee flexion 4 4  Knee extension 4 4  Ankle dorsiflexion    Ankle plantarflexion    Ankle inversion    Ankle eversion     (Blank rows = not tested)    GAIT:  Slow, cautious gait, significant valgus at L knee.    TODAY'S TREATMENT: 06/19/27 Therapeutic Exercise: Aerobic: Supine:  Heel slides x 10 bil; Quad sets 2x 10 bil;  SLR 2 x 10;  Seated:  LAQ 2 x 10 bil; sit to stand , higher surface, with education on  mechanics;  Standing:  HR x 15, Marching x 15, tandem stance 30 sec x 3 bil;  Stretches:  Neuromuscular Re-education: Manual Therapy: Self Care:    PATIENT EDUCATION:  Education details: updated HEP Person educated: Patient Education  method: Consulting civil engineer, Demonstration, Tactile cues, Verbal cues, and Handouts Education comprehension: verbalized understanding, returned demonstration, verbal cues required, tactile cues required, and needs further education   HOME EXERCISE PROGRAM: Access Code: [email or web address]     ASSESSMENT:  CLINICAL IMPRESSION: Patient with good tolerance for exercises today. Updated HEP for strengthening. Plan to progress strength as tolerated.    OBJECTIVE IMPAIRMENTS Abnormal gait, decreased activity tolerance, decreased balance, decreased  knowledge of use of DME, decreased mobility, difficulty walking, decreased ROM, decreased strength, impaired perceived functional ability, increased muscle spasms, and pain.   ACTIVITY LIMITATIONS lifting, bending, squatting, stairs, transfers, and locomotion level  PARTICIPATION LIMITATIONS: meal prep, cleaning, shopping, and community activity  PERSONAL FACTORS Time since onset of injury/illness/exacerbation are also affecting patient's functional outcome.   REHAB POTENTIAL: Good  CLINICAL DECISION MAKING: Stable/uncomplicated  EVALUATION COMPLEXITY: Low   GOALS: Goals reviewed with patient? Yes  SHORT TERM GOALS: Target date: 06/30/2022   Pt to be independent with initial HEP  Goal status: INITIAL     LONG TERM GOALS: Target date: 07/28/2022   Pt to be independent with final HEP  Goal status: INITIAL  2.  Pt to demo improved strength of bil knees to at least 4+/5 to improve stability and gait   Goal status: INITIAL  3.  Pt to report decreased pain in bil knees to 0-4/10 with standing activity.  Goal status: INITIAL  4.  Pt to demo ability for stair navigation, using 1 HR, safety and independently, to improve ability for community navigation.   Goal status: INITIAL     PLAN: PT FREQUENCY: 1-2x/week  PT DURATION: 6 weeks  PLANNED INTERVENTIONS: Therapeutic exercises, Therapeutic activity, Neuromuscular re-education, Balance  training, Gait training, Patient/Family education, Self Care, Joint mobilization, Joint manipulation, Stair training, DME instructions, Dry Needling, Electrical stimulation, Spinal manipulation, Spinal mobilization, Cryotherapy, Moist heat, Taping, Vasopneumatic device, Traction, Ionotophoresis '4mg'$ /ml Dexamethasone, and Manual therapy  PLAN FOR NEXT SESSION:   Lyndee Hensen, PT, DPT 2:13 PM  06/18/22

## 2022-06-23 DIAGNOSIS — I10 Essential (primary) hypertension: Secondary | ICD-10-CM | POA: Diagnosis not present

## 2022-06-24 ENCOUNTER — Encounter: Payer: Self-pay | Admitting: Physical Therapy

## 2022-06-24 ENCOUNTER — Ambulatory Visit (INDEPENDENT_AMBULATORY_CARE_PROVIDER_SITE_OTHER): Payer: Medicare Other | Admitting: Physical Therapy

## 2022-06-24 DIAGNOSIS — M25561 Pain in right knee: Secondary | ICD-10-CM | POA: Diagnosis not present

## 2022-06-24 DIAGNOSIS — M25562 Pain in left knee: Secondary | ICD-10-CM

## 2022-06-24 DIAGNOSIS — G8929 Other chronic pain: Secondary | ICD-10-CM | POA: Diagnosis not present

## 2022-06-24 NOTE — Therapy (Signed)
OUTPATIENT PHYSICAL THERAPY LOWER EXTREMITY TREATMENT   Patient Name: Jennifer Yang MRN: [id number] DOB:August 29, 1941, 81 y.o., female Today's Date: 06/24/2022   PT End of Session - 06/24/22 1011     Visit Number 3    Number of Visits 12    Date for PT Re-Evaluation 07/28/22    Authorization Type Medicare    PT Start Time 0805    PT Stop Time 6767    PT Time Calculation (min) 39 min    Activity Tolerance Patient tolerated treatment well    Behavior During Therapy [facility] for tasks assessed/performed              Past Medical History:  Diagnosis Date   Allergy    shell fish   Anxiety    takes xanax on a rare occasion   Arthritis    knees   Benign essential HTN 11/24/2011   Bladder infection 03/18/15   completed antibotics    Breast cancer, right breast ([place]) 02/16/2008   Breast cancer, stage 1 ([place]) 07/16/1990   Cancer ([place])    right Chemo/radiation '01/radical '09   Cataract    Contact lens/glasses fitting    Drug-induced osteoporosis 11/25/2011   Family history of breast cancer    mother   GERD (gastroesophageal reflux disease)    Hyperlipidemia    Hypertension    Lymphedema of arm 11/24/2011   right arm   MVP (mitral valve prolapse)    PONV (postoperative nausea and vomiting)    has vertigo also-would like scop patch   Syncope 2019   after colonoscopy states "dehydrated"   Thyroid disease    Vertigo    Past Surgical History:  Procedure Laterality Date   BREAST LUMPECTOMY     2001 right   CATARACT EXTRACTION, BILATERAL     COLONOSCOPY  2006   w/Dr.Orr   COLONOSCOPY  2019   fainted afterward at home   ESOPHAGOGASTRODUODENOSCOPY (EGD) WITH PROPOFOL N/A 08/20/2021   Procedure: ESOPHAGOGASTRODUODENOSCOPY (EGD) WITH PROPOFOL;  Surgeon: Carol Ada, MD;  [facility];  Service: Endoscopy;  Laterality: N/A;   FOOT AMPUTATION     right hammer toe   FOOT NEUROMA SURGERY     left   KNEE ARTHROSCOPY     left   MASS EXCISION  07/21/2012   Procedure: EXCISION  MASS;  Surgeon: Adin Hector, MD;  Location: [facility];  Service: General;  Laterality: N/A;  excision 15cm posterior neck mass posterior   MASTECTOMY     MASTECTOMY, RADICAL     2009 right   THYROIDECTOMY, PARTIAL     TOTAL HIP ARTHROPLASTY Right 06/21/2019   Procedure: TOTAL HIP ARTHROPLASTY ANTERIOR APPROACH;  Surgeon: Paralee Cancel, MD;  Location: [facility];  Service: Orthopedics;  Laterality: Right;   TOTAL HIP ARTHROPLASTY Left 2022   UPPER GASTROINTESTINAL ENDOSCOPY     Patient Active Problem List   Diagnosis Date Noted   Acute blood loss anemia 06/23/2019   Fall at home, initial encounter 06/23/2019   Hypothyroidism 06/21/2019   Thrombocytopenia ([place]) 06/21/2019   Right femoral fracture ([place]) 06/20/2019   Solitary pulmonary nodule 03/22/2015   Gaseous abdominal distention 03/22/2015   Near syncope 03/21/2015   Orthostatic hypotension 03/21/2015   Dyslipidemia 03/21/2015   Lipoma of neck 07/28/2012   Drug-induced osteoporosis 11/25/2011   Lymphedema of arm 11/24/2011   Goiter, 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 Using cane only at night.   Eval: Pain in L knee, longstanding. She has had cortisone and gel shots in the past. Does not feel that much has helped. Supposed to be having different kind of injection.  Difficulty with initial standing activity, stairs, Has 3-4 steps to enter, 2 hand rails.  L>R bil Pt would like to avoid having knee replacement. She has had 2 hip replacements in the past.    PERTINENT HISTORY: A-Fib, Previous breast CA, Osteoporosis, bil THA,   PAIN:  Are you having pain? Yes: NPRS scale: 5-8 /10 Pain location: L knee  Pain description: achey Aggravating factors: first thing in AM, initial standing activity.  Relieving factors: none stated   PRECAUTIONS: None  WEIGHT BEARING RESTRICTIONS No  FALLS:  Has patient fallen in last 6 months? No Has fallen 2x/previous years, and has had 2 hip fx.    PLOF: Independent  PATIENT GOALS   avoid knee replacement,    OBJECTIVE:   DIAGNOSTIC FINDINGS:    COGNITION:  Overall cognitive status: Within functional limits for tasks assessed    POSTURE:  L knee significant valgus L>R.  PALPATION:   Tenderness in joint line L>R   LOWER EXTREMITY ROM    Hip ROM: WFL   Active ROM Right eval Left eval  Hip flexion    Hip extension    Hip abduction    Hip adduction    Hip internal rotation    Hip external rotation    Knee flexion 120 115  Knee extension 0 -3  Ankle dorsiflexion    Ankle plantarflexion    Ankle inversion    Ankle eversion     (Blank rows = not tested)  LOWER EXTREMITY MMT:  MMT Right eval Left eval  Hip flexion 4 4  Hip extension    Hip abduction 4- 4-  Hip adduction    Hip internal rotation 4 4  Hip external rotation 4 4  Knee flexion 4 4  Knee extension 4 4  Ankle dorsiflexion    Ankle plantarflexion    Ankle inversion    Ankle eversion     (Blank rows = not tested)    GAIT:  Slow, cautious gait, significant valgus at L knee.    TODAY'S TREATMENT: 06/25/27 Therapeutic Exercise: Aerobic: Tried bike (significant valgus movement of knee - discontinued) Supine:  Heel slides x 10 bil; Quad sets 2 x 10 bil;  SLR 2 x 10 bil; Clams GTB x 20;  Seated:  LAQ 2lb 2 x 10 bil;   Standing:  HR x 15, Marching (pain and crepitus); Hip abd 2x10 bil;   Stretches:  Neuromuscular Re-education: Manual Therapy: Self Care:   PATIENT EDUCATION:  Education details: updated  HEP, discussed bracing, cane use , and aquatic therapy for decreasing pain in knee.  Person educated: Patient Education method: Explanation, Demonstration, Tactile cues, Verbal cues, and Handouts Education comprehension: verbalized understanding, returned demonstration, verbal cues required, tactile cues required, and needs further education   HOME EXERCISE PROGRAM: Access Code: [email or web address]     ASSESSMENT:  CLINICAL IMPRESSION: Patient educated on other pain relief today,  discussed aquatic therapy, cane use, and brace use if needed. She has good ability for ther ex performed, but continues to have high pain levels overall.    OBJECTIVE IMPAIRMENTS Abnormal gait, decreased activity tolerance, decreased balance, decreased knowledge of use of DME, decreased mobility, difficulty walking, decreased ROM, decreased strength, impaired perceived functional ability, increased muscle spasms, and pain.   ACTIVITY LIMITATIONS lifting, bending, squatting, stairs, transfers, and locomotion level  PARTICIPATION LIMITATIONS: meal prep, cleaning, shopping, and community activity  PERSONAL FACTORS Time since onset of injury/illness/exacerbation are also affecting patient's functional outcome.   REHAB POTENTIAL: Good  CLINICAL DECISION MAKING: Stable/uncomplicated  EVALUATION COMPLEXITY: Low   GOALS: Goals reviewed with patient? Yes  SHORT TERM GOALS: Target date: 06/30/2022   Pt to be independent with initial HEP  Goal status: INITIAL     LONG TERM GOALS: Target date: 07/28/2022   Pt to be independent with final HEP  Goal status: INITIAL  2.  Pt to demo improved strength of bil knees to at least 4+/5 to improve stability and gait   Goal status: INITIAL  3.  Pt to report decreased pain in bil knees to 0-4/10 with standing activity.  Goal status: INITIAL  4.  Pt to demo ability for stair navigation, using 1 HR, safety and independently, to improve ability for community navigation.    Goal status: INITIAL     PLAN: PT FREQUENCY: 1-2x/week  PT DURATION: 6 weeks  PLANNED INTERVENTIONS: Therapeutic exercises, Therapeutic activity, Neuromuscular re-education, Balance training, Gait training, Patient/Family education, Self Care, Joint mobilization, Joint manipulation, Stair training, DME instructions, Dry Needling, Electrical stimulation, Spinal manipulation, Spinal mobilization, Cryotherapy, Moist heat, Taping, Vasopneumatic device, Traction, Ionotophoresis '4mg'$ /ml Dexamethasone, and Manual therapy  PLAN FOR NEXT SESSION:   Lyndee Hensen, PT, DPT 10:13 AM  06/24/22

## 2022-06-25 ENCOUNTER — Telehealth: Payer: Self-pay

## 2022-06-25 NOTE — Telephone Encounter (Signed)
No changes at this time, information is for appointment with Dr. Einar Gip on Friday 8/11.  Spoke with patient today incident to BP reading of 90/54. Patient denied having symptoms of hypotension and reported to feeling fine. She took her metoprolol, losartan, and spironolactone this morning like normal (takes amlodipine in the evening). BP's are well controlled but somewhat soft at home.   06/25/2022 Wednesday at 10:25 AM 105 / 59      06/25/2022 Wednesday at 10:20 AM 93 / 54      06/25/2022 Wednesday at 09:55 AM 90 / 54      06/25/2022 Wednesday at 09:52 AM 103 / 54      06/24/2022 Tuesday at 07:10 AM 118 / 68      06/23/2022 Monday at 05:52 AM 111 / 59      06/23/2022 Monday at 05:49 AM 114 / 63      06/22/2022 'Sunday at 07:36 AM 116 / 66      06/21/2022 Saturday at 02:21 PM 106 / 64      06/20/2022 Friday at 08:24 AM 126 / 64      06/19/2022 Thursday at 06:29 AM 105 / 53      06/18/2022 Wednesday at 05:56 PM 129 / 72      06/17/2022 Tuesday at 10:11 AM 121 / 72      06/14/2022 Saturday at 07:48 AM 115 / 67      06/13/2022 Friday at 10:18 AM 118 / 68      06/13/2022 Friday at 10:15 AM 123 / 75      07'$ /25/2023 Tuesday at 07:02 AM 117 / 64

## 2022-06-25 NOTE — Telephone Encounter (Signed)
I reviewed the BP recordings. I agree with the assessment and plan

## 2022-06-26 ENCOUNTER — Encounter: Payer: Self-pay | Admitting: Physical Therapy

## 2022-06-26 ENCOUNTER — Ambulatory Visit (INDEPENDENT_AMBULATORY_CARE_PROVIDER_SITE_OTHER): Payer: Medicare Other | Admitting: Physical Therapy

## 2022-06-26 DIAGNOSIS — M25562 Pain in left knee: Secondary | ICD-10-CM | POA: Diagnosis not present

## 2022-06-26 DIAGNOSIS — G8929 Other chronic pain: Secondary | ICD-10-CM

## 2022-06-26 DIAGNOSIS — M25561 Pain in right knee: Secondary | ICD-10-CM

## 2022-06-26 NOTE — Therapy (Signed)
OUTPATIENT PHYSICAL THERAPY LOWER EXTREMITY TREATMENT   Patient Name: Jennifer Yang MRN: [id number] DOB:31-Jan-1941, 81 y.o., female Today's Date: 06/26/2022   PT End of Session - 06/27/22 2059     Visit Number 4    Number of Visits 12    Date for PT Re-Evaluation 07/28/22    Authorization Type Medicare    PT Start Time 7829    PT Stop Time 1300    PT Time Calculation (min) 40 min    Activity Tolerance Patient tolerated treatment well    Behavior During Therapy [facility] for tasks assessed/performed                Past Medical History:  Diagnosis Date   Allergy    shell fish   Anxiety    takes xanax on a rare occasion   Arthritis    knees   Benign essential HTN 11/24/2011   Bladder infection 03/18/15   completed antibotics    Breast cancer, right breast ([place]) 02/16/2008   Breast cancer, stage 1 ([place]) 07/16/1990   Cancer ([place])    right Chemo/radiation '01/radical '09   Cataract    Contact lens/glasses fitting    Drug-induced osteoporosis 11/25/2011   Family history of breast cancer    mother   GERD (gastroesophageal reflux disease)    Hyperlipidemia    Hypertension    Lymphedema of arm 11/24/2011   right arm   MVP (mitral valve prolapse)    PONV (postoperative nausea and vomiting)    has vertigo also-would like scop patch   Syncope 2019   after colonoscopy states "dehydrated"   Thyroid disease    Vertigo    Past Surgical History:  Procedure Laterality Date   BREAST LUMPECTOMY     2001 right   CATARACT EXTRACTION, BILATERAL     COLONOSCOPY  2006   w/Dr.Orr   COLONOSCOPY  2019   fainted afterward at home   ESOPHAGOGASTRODUODENOSCOPY (EGD) WITH PROPOFOL N/A 08/20/2021   Procedure: ESOPHAGOGASTRODUODENOSCOPY (EGD) WITH PROPOFOL;  Surgeon: Carol Ada, MD;  [facility];  Service: Endoscopy;  Laterality: N/A;   FOOT AMPUTATION     right hammer toe   FOOT NEUROMA SURGERY     left   KNEE ARTHROSCOPY     left   MASS EXCISION  07/21/2012   Procedure:  EXCISION MASS;  Surgeon: Adin Hector, MD;  Location: [facility];  Service: General;  Laterality: N/A;  excision 15cm posterior neck mass posterior   MASTECTOMY     MASTECTOMY, RADICAL     2009 right   THYROIDECTOMY, PARTIAL     TOTAL HIP ARTHROPLASTY Right 06/21/2019   Procedure: TOTAL HIP ARTHROPLASTY ANTERIOR APPROACH;  Surgeon: Paralee Cancel, MD;  [facility];  Service: Orthopedics;  Laterality: Right;   TOTAL HIP ARTHROPLASTY Left 2022   UPPER GASTROINTESTINAL ENDOSCOPY     Patient Active Problem List   Diagnosis Date Noted   Acute blood loss anemia 06/23/2019   Fall at home, initial encounter 06/23/2019   Hypothyroidism 06/21/2019   Thrombocytopenia ([place]) 06/21/2019   Right femoral fracture ([place]) 06/20/2019   Solitary pulmonary nodule 03/22/2015   Gaseous abdominal distention 03/22/2015   Near syncope 03/21/2015   Orthostatic hypotension 03/21/2015   Dyslipidemia 03/21/2015   Lipoma of neck 07/28/2012   Drug-induced osteoporosis 11/25/2011   Lymphedema of arm 11/24/2011   Goiter, 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    Eval: Pain in L knee, longstanding. She has had cortisone and gel shots in the past. Does not feel that much has helped. Supposed to be having different kind of injection.  Difficulty with initial standing activity, stairs, Has 3-4 steps to enter, 2 hand rails.  L>R bil Pt would like to avoid having knee replacement. She has had 2 hip replacements in the past.    PERTINENT HISTORY: A-Fib, Previous breast CA, Osteoporosis, bil THA,   PAIN:  Are you having pain? Yes: NPRS scale: 5-8 /10 Pain location: L knee  Pain description: achey Aggravating factors: first thing in AM, initial standing activity.  Relieving factors: none stated   PRECAUTIONS: None  WEIGHT BEARING RESTRICTIONS No  FALLS:  Has patient fallen in last 6 months? No Has fallen 2x/previous years, and has had 2 hip fx.    PLOF: Independent  PATIENT GOALS   avoid knee replacement,    OBJECTIVE:   DIAGNOSTIC FINDINGS:    COGNITION:  Overall cognitive status: Within functional limits for tasks assessed    POSTURE:  L knee significant valgus L>R.  PALPATION:   Tenderness in joint line L>R   LOWER EXTREMITY ROM    Hip ROM: WFL   Active ROM Right eval Left eval  Hip flexion    Hip extension    Hip abduction    Hip adduction    Hip internal rotation    Hip external rotation    Knee flexion 120 115  Knee extension 0 -3  Ankle dorsiflexion    Ankle plantarflexion    Ankle inversion    Ankle eversion     (Blank rows = not tested)  LOWER EXTREMITY MMT:  MMT Right eval Left eval  Hip flexion 4 4  Hip extension    Hip abduction 4- 4-  Hip adduction    Hip internal rotation 4 4  Hip external rotation 4 4  Knee flexion 4 4  Knee extension 4 4  Ankle dorsiflexion    Ankle plantarflexion    Ankle inversion    Ankle eversion     (Blank rows = not tested)    GAIT:  Slow, cautious gait, significant valgus at L knee.    TODAY'S TREATMENT: 06/27/27 Therapeutic Exercise: Aerobic:  Supine:  Heel slides x 10 bil; Quad sets 2 x 10 bil;  SLR 2 x 10 bil; Clams GTB x 20;  Seated:  LAQ 2lb 2 x 10 bil;  [place] GTB x 15 bil;  Sit to stand high mat table x 8;  Standing:  Hip abd 2x10 bil;   Stretches:  Neuromuscular Re-education: Manual Therapy: Self Care:   PATIENT EDUCATION:  Education details: updated HEP, discussed bracing, cane use , and aquatic  therapy for decreasing pain in knee.  Person educated: Patient Education method: Explanation, Demonstration, Tactile cues, Verbal cues, and Handouts Education comprehension: verbalized understanding, returned demonstration, verbal cues required, tactile cues required, and needs further education   HOME EXERCISE PROGRAM: Access Code: [email or web address]    ASSESSMENT:  CLINICAL IMPRESSION: Patient with good tolerance for exercises today.  Plan to progress strength as tolerated.   OBJECTIVE IMPAIRMENTS Abnormal gait, decreased activity tolerance, decreased balance, decreased knowledge of use of DME, decreased mobility, difficulty walking, decreased ROM, decreased strength, impaired perceived functional ability, increased muscle spasms, and pain.   ACTIVITY LIMITATIONS lifting, bending, squatting, stairs, transfers, and locomotion level  PARTICIPATION LIMITATIONS: meal prep, cleaning, shopping, and community activity  PERSONAL FACTORS Time since onset of injury/illness/exacerbation are also affecting patient's functional outcome.   REHAB POTENTIAL: Good  CLINICAL DECISION MAKING: Stable/uncomplicated  EVALUATION COMPLEXITY: Low   GOALS: Goals reviewed with patient? Yes  SHORT TERM GOALS: Target date: 06/30/2022   Pt to be independent with initial HEP  Goal status: INITIAL     LONG TERM GOALS: Target date: 07/28/2022   Pt to be independent with final HEP  Goal status: INITIAL  2.  Pt to demo improved strength of bil knees to at least 4+/5 to improve stability and gait   Goal status: INITIAL  3.  Pt to report decreased pain in bil knees to 0-4/10 with standing activity.  Goal status: INITIAL  4.  Pt to demo ability for stair navigation, using 1 HR, safety and independently, to improve ability for community navigation.   Goal status: INITIAL     PLAN: PT FREQUENCY: 1-2x/week  PT DURATION: 6 weeks  PLANNED INTERVENTIONS: Therapeutic exercises, Therapeutic activity,  Neuromuscular re-education, Balance training, Gait training, Patient/Family education, Self Care, Joint mobilization, Joint manipulation, Stair training, DME instructions, Dry Needling, Electrical stimulation, Spinal manipulation, Spinal mobilization, Cryotherapy, Moist heat, Taping, Vasopneumatic device, Traction, Ionotophoresis '4mg'$ /ml Dexamethasone, and Manual therapy  PLAN FOR NEXT SESSION:   Lyndee Hensen, PT, DPT 8:59 PM  06/27/22

## 2022-06-27 ENCOUNTER — Encounter: Payer: Self-pay | Admitting: Cardiology

## 2022-06-27 ENCOUNTER — Ambulatory Visit: Payer: Medicare Other | Admitting: Cardiology

## 2022-06-27 ENCOUNTER — Encounter: Payer: Self-pay | Admitting: Physical Therapy

## 2022-06-27 VITALS — BP 123/53 | HR 44 | Temp 97.7°F | Resp 16 | Ht 69.0 in | Wt 173.0 lb

## 2022-06-27 DIAGNOSIS — R001 Bradycardia, unspecified: Secondary | ICD-10-CM | POA: Diagnosis not present

## 2022-06-27 DIAGNOSIS — I480 Paroxysmal atrial fibrillation: Secondary | ICD-10-CM | POA: Diagnosis not present

## 2022-06-27 DIAGNOSIS — N1832 Chronic kidney disease, stage 3b: Secondary | ICD-10-CM

## 2022-06-27 DIAGNOSIS — I10 Essential (primary) hypertension: Secondary | ICD-10-CM | POA: Diagnosis not present

## 2022-06-27 MED ORDER — METOPROLOL SUCCINATE ER 25 MG PO TB24: 25.0000 mg | ORAL_TABLET | Freq: Every day | ORAL | 3 refills | Status: DC

## 2022-06-27 NOTE — Progress Notes (Signed)
Primary Physician/Referring:  [name], MD  Patient ID: Jennifer Yang, female    DOB: 02-Dec-1940, 81 y.o.   MRN: [id number]  Chief Complaint  Patient presents with   Atrial Fibrillation   Follow-up    6 month   Hypertension   Bradycardia   HPI:    Jennifer Yang  is a 81 y.o. Caucasian female patient with hypertension, hyperlipidemia, prediabetes mellitus, had a accidental fall and had left femoral fracture in the last week of July 2022 and was admitted to [facility] and had new onset A. fib with RVR, spontaneously converted back to sinus rhythm but had a second episode, was started on metoprolol along with diltiazem and discharged home.  She presents for follow-up and evaluation of bradycardia and also leg edema and hypertension.  I had started her on spironolactone which she is tolerating, leg edema has completely resolved.  She is presently enrolled in pace for care management, blood pressure has been under excellent control.  She is accompanied by her husband, no specific complaints today. 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   Family history of breast cancer    mother   GERD (gastroesophageal reflux disease)    Hyperlipidemia    Hypertension    Lymphedema of arm 11/24/2011   right arm   MVP (mitral valve prolapse)    PONV (postoperative nausea and vomiting)    has vertigo also-would like scop patch   Syncope 2019   after colonoscopy states "dehydrated"   Thyroid disease    Vertigo    Social History   Tobacco Use   Smoking status: Never   Smokeless tobacco:  Never  Substance Use Topics   Alcohol use: No    Alcohol/week: 0.0 standard drinks of alcohol   Marital Status: Married  ROS  Review of Systems  Cardiovascular:  Negative for chest pain, dyspnea on exertion, leg swelling and palpitations.  Musculoskeletal:  Positive for arthritis and joint pain.  Gastrointestinal:  Negative for melena.  Neurological:  Negative for dizziness.   Objective  Blood pressure (!) 123/53, pulse (!) 44, temperature 97.7 F (36.5 C), resp. rate 16, height '5\' 9"'  (1.753 m), weight 173 lb (78.5 kg), SpO2 100 %. Body mass index is 25.55 kg/m.     06/27/2022    8:15 AM 06/11/2022    8:25 AM 05/13/2022   10:37 AM  Vitals with BMI  Height '5\' 9"'  '5\' 9"'  '5\' 9"'   Weight 173 lbs 174 lbs 2 oz 174 lbs 2 oz  BMI 25.54 71.2 19.7  Systolic 588 325 498  Diastolic 53 60 42  Pulse 44 41 45     Physical Exam Neck:     Vascular: No carotid bruit or JVD.  Cardiovascular:     Rate and Rhythm: Regular rhythm. Bradycardia present.     Pulses: Intact distal pulses.     Heart sounds: Normal heart sounds. No murmur heard.    No gallop.  Pulmonary:     Effort: Pulmonary  effort is normal.     Breath sounds: Normal breath sounds.  Abdominal:     General: Bowel sounds are normal.     Palpations: Abdomen is soft.  Musculoskeletal:     Right lower leg: No edema.     Left lower leg: No edema.      Laboratory examination:   Recent Labs    12/17/21 1317 05/13/22 1000  NA 146* 142  K 4.9 4.4  CL 107* 108  CO2 17* 26  GLUCOSE 89 116*  BUN 24 24*  CREATININE 1.30* 1.30*  CALCIUM 10.1 10.0  GFRNONAA  --  42*   CrCl cannot be calculated (Patient's most recent lab result is older than the maximum 21 days allowed.).     Latest Ref Rng & Units 05/13/2022   10:00 AM 12/17/2021    1:17 PM 05/13/2021    9:18 AM  CMP  Glucose 70 - 99 mg/dL 116  89  118   BUN 8 - 23 mg/dL '24  24  22   ' Creatinine 0.44 - 1.00 mg/dL 1.30  1.30  1.10   Sodium 135 - 145 mmol/L 142  146  141    Potassium 3.5 - 5.1 mmol/L 4.4  4.9  4.9   Chloride 98 - 111 mmol/L 108  107  105   CO2 22 - 32 mmol/L '26  17  27   ' Calcium 8.9 - 10.3 mg/dL 10.0  10.1  10.1   Total Protein 6.5 - 8.1 g/dL 7.1   7.0   Total Bilirubin 0.3 - 1.2 mg/dL 1.2   1.2   Alkaline Phos 38 - 126 U/L 39   47   AST 15 - 41 U/L 14   16   ALT 0 - 44 U/L 7   10       Latest Ref Rng & Units 05/13/2022   10:00 AM 05/13/2021    9:18 AM 05/10/2020    7:47 AM  CBC  WBC 4.0 - 10.5 K/uL 5.8  5.9  6.7   Hemoglobin 12.0 - 15.0 g/dL 12.1  12.7  13.4   Hematocrit 36.0 - 46.0 % 37.6  40.3  41.7   Platelets 150 - 400 K/uL 168  160  144     External labs:   Labs 05/19/2022:  A1c 5.9%.  Vitamin D 31.9.  BUN 21, creatinine 1.37, EGFR 36,, potassium 4.3, LFTs normal.  TSH 1.46.  Total cholesterol 152, triglycerides 122, HDL 49, LDL 79.  Labs 02/10/2022:  Hb 12.3/HCT 38.4, platelets 152, normal indicis.  Medications and allergies   Allergies  Allergen Reactions   Codeine Sulfate Swelling   Fish-Derived Products Other (See Comments)    Cellulitis    Rocephin [Ceftriaxone Sodium] Other (See Comments)    Low blood count   Sulfa Antibiotics Other (See Comments)    Mouth and lip sores     Medication list after today's encounter    Current Outpatient Medications:    ALPRAZolam (XANAX) 0.5 MG tablet, Take 0.5 mg by mouth daily as needed for anxiety. , Disp: , Rfl:    amLODipine (NORVASC) 5 MG tablet, Take 5 mg by mouth daily., Disp: , Rfl:    apixaban (ELIQUIS) 5 MG TABS tablet, Take 1 tablet (5 mg total) by mouth 2 (two) times daily., Disp: 200 tablet, Rfl: 3   fluticasone (FLONASE) 50 MCG/ACT nasal spray, Place 1-2 sprays into both nostrils daily as needed for allergies or rhinitis., Disp: , Rfl:    losartan (  COZAAR) 100 MG tablet, Take 100 mg by mouth daily. , Disp: , Rfl:    metoprolol succinate (TOPROL-XL) 25 MG 24 hr tablet, Take 1 tablet (25 mg total) by mouth daily. Take with or immediately following a meal.,  Disp: 90 tablet, Rfl: 3   omeprazole (PRILOSEC) 40 MG capsule, Take 40 mg by mouth as needed., Disp: , Rfl:    Probiotic Product (ALIGN PO), Take by mouth., Disp: , Rfl:    rosuvastatin (CRESTOR) 5 MG tablet, Take 5 mg by mouth daily., Disp: , Rfl: 0   spironolactone (ALDACTONE) 25 MG tablet, Take 1 tablet (25 mg total) by mouth every morning., Disp: 90 tablet, Rfl: 3   trimethoprim (TRIMPEX) 100 MG tablet, Take 100 mg by mouth daily., Disp: , Rfl:   Radiology:   CT of the chest 04/28/2016: 1. No acute cardiopulmonary abnormalities. 2. Aortic atherosclerosis and multi vessel coronary artery calcification. 3. 2 mm nodule in the anterior right upper lobe is stable from previous exam and is compatible with a benign etiology.  OTHER:   Upper GI endoscopy 08/20/2021: Reflux esophagitis with no bleeding.  3 cm hiatal hernia.  Colonoscopy performed on 05/25/2019 revealing 2 1 to 2 mm polyps and mild diverticulosis and nonbleeding internal hemorrhoids.  Cardiac Studies:  [facility], [place], [place]  Echocardiogram 07/16/2021: Normal LV size and normal LV systolic function, EF 08%.  Mild LVH. Normal RV systolic function and normal size. Trivial pericardial effusion.  EKG:    EKG 06/27/2022: Marked sinus bradycardia at rate of 42 bpm, incomplete right bundle branch block.  Otherwise normal EKG. Compared to 01/03/2022, no significant change, heart rate was 50 bpm.  EKG 08/15/2021: Atrial fibrillation with rapid ventricular response at rate of 159 bpm.  Poor R wave progression.  Low-voltage complexes.  Nonspecific T abnormality.  Assessment     ICD-10-CM   1. Primary hypertension  I10 EKG 12-Lead    metoprolol succinate (TOPROL-XL) 25 MG 24 hr tablet    2. Paroxysmal atrial fibrillation (HCC)  I48.0 metoprolol succinate (TOPROL-XL) 25 MG 24 hr tablet    3. Bradycardia by electrocardiogram  R00.1     4. Stage 3b chronic kidney disease (HCC)  N18.32        CHA2DS2-VASc Score is 6.   Yearly risk of stroke: 9.8% (A, F, HTN, Pre DM, Coronary atherosclerosis).  Score of 1=0.6; 2=2.2; 3=3.2; 4=4.8; 5=7.2; 6=9.8; 7=>9.8) -(CHF; HTN; vasc disease DM,  Female = 1; Age <65 =0; 65-74 = 1,  >75 =2; stroke/embolism= 2).    Medications Discontinued During This Encounter  Medication Reason   nitrofurantoin, macrocrystal-monohydrate, (MACROBID) 100 MG capsule    metoprolol succinate (TOPROL-XL) 50 MG 24 hr tablet Dose change    Meds ordered this encounter  Medications   metoprolol succinate (TOPROL-XL) 25 MG 24 hr tablet    Sig: Take 1 tablet (25 mg total) by mouth daily. Take with or immediately following a meal.    Dispense:  90 tablet    Refill:  3   Orders Placed This Encounter  Procedures   EKG 12-Lead   Recommendations:   Jennifer Yang is a 81 y.o. Caucasian female patient with hypertension, hyperlipidemia, prediabetes mellitus, had a accidental fall and had left femoral fracture in the last week of July 2022 and was admitted to [facility] and had new onset A. fib with RVR, spontaneously converted back to sinus rhythm but had a second episode, was started on metoprolol along with diltiazem and discharged home.  In spite of stage IIIa-B chronic kidney disease, her blood pressure was uncontrolled and she also had significant leg edema and I had started her on spironolactone since then blood pressure has been under excellent control and blood pressure in fact has been softer at some point and she is presently in [facility] in our office.  Hence I will reduce the dose of the metoprolol succinate from 50 mg to 25 mg daily.  Continue spironolactone.  I reviewed her external labs, serum creatinine has remained stable and potassium levels are normal.  She continues to have bradycardia and hopefully reducing the dose of the beta-blocker will help.  She will continue with RPM.  With regard to anticoagulation, hemoglobin has remained stable, no bleeding diathesis.  She has not had any  TIA-like symptoms.  I will see him back in 6 months for follow-up.   Adrian Prows, MD, [facility] 06/27/2022, 9:15 AM Office: [phone]

## 2022-07-03 ENCOUNTER — Ambulatory Visit: Payer: Medicare Other | Admitting: Cardiology

## 2022-07-08 NOTE — Progress Notes (Unsigned)
   I, Peterson Lombard, LAT, ATC acting as a scribe for Lynne Leader, MD.  Jennifer Yang is a 81 y.o. female who presents [facility] today for f/u L knee pain. Pt was last seen by Dr. Georgina Snell on 06/11/22 and pt declined orthopedic surgery referral and aquatic therapy referral, and was advised we would work on authorization of [place]. Pt was also re-referred to PT, completing 4 visits. Today, pt reports  Dx imaging: 06/04/22 L knee XR at [place] office  Pertinent review of systems: ***  Relevant historical information: ***   Exam:  There were no vitals taken for this visit. General: Well Developed, well nourished, and in no acute distress.   MSK: ***    Lab and Radiology Results No results found for this or any previous visit (from the past 72 hour(s)). No results found.     Assessment and Plan: 81 y.o. female with ***   PDMP not reviewed this encounter. No orders of the defined types were placed in this encounter.  No orders of the defined types were placed in this encounter.    Discussed warning signs or symptoms. Please see discharge instructions. Patient expresses understanding.   ***

## 2022-07-09 ENCOUNTER — Ambulatory Visit: Payer: Self-pay

## 2022-07-09 ENCOUNTER — Encounter: Payer: Self-pay | Admitting: Physical Therapy

## 2022-07-09 ENCOUNTER — Ambulatory Visit (INDEPENDENT_AMBULATORY_CARE_PROVIDER_SITE_OTHER): Payer: Medicare Other | Admitting: Family Medicine

## 2022-07-09 ENCOUNTER — Ambulatory Visit (INDEPENDENT_AMBULATORY_CARE_PROVIDER_SITE_OTHER): Payer: Medicare Other | Admitting: Physical Therapy

## 2022-07-09 VITALS — BP 116/64 | HR 50 | Ht 69.0 in | Wt 171.0 lb

## 2022-07-09 DIAGNOSIS — M25562 Pain in left knee: Secondary | ICD-10-CM | POA: Diagnosis not present

## 2022-07-09 DIAGNOSIS — G8929 Other chronic pain: Secondary | ICD-10-CM | POA: Diagnosis not present

## 2022-07-09 DIAGNOSIS — M1712 Unilateral primary osteoarthritis, left knee: Secondary | ICD-10-CM

## 2022-07-09 DIAGNOSIS — M25561 Pain in right knee: Secondary | ICD-10-CM

## 2022-07-09 MED ORDER — TRIAMCINOLONE ACETONIDE 32 MG IX SRER
32.0000 mg | Freq: Once | INTRA_ARTICULAR | Status: AC
  Administered 2022-07-09: 32 mg via INTRA_ARTICULAR

## 2022-07-09 NOTE — Therapy (Signed)
OUTPATIENT PHYSICAL THERAPY LOWER EXTREMITY TREATMENT   Patient Name: Jennifer Yang MRN: [id number] DOB:02-09-1941, 81 y.o., female Today's Date: 07/09/2022   PT End of Session - 07/09/22 1358     Visit Number 5    Number of Visits 12    Date for PT Re-Evaluation 07/28/22    Authorization Type Medicare    PT Start Time 8372    PT Stop Time 1430    PT Time Calculation (min) 41 min    Activity Tolerance Patient tolerated treatment well    Behavior During Therapy [facility] for tasks assessed/performed                 Past Medical History:  Diagnosis Date   Allergy    shell fish   Anxiety    takes xanax on a rare occasion   Arthritis    knees   Benign essential HTN 11/24/2011   Bladder infection 03/18/15   completed antibotics    Breast cancer, right breast ([place]) 02/16/2008   Breast cancer, stage 1 ([place]) 07/16/1990   Cancer ([place])    right Chemo/radiation '01/radical '09   Cataract    Contact lens/glasses fitting    Drug-induced osteoporosis 11/25/2011   Family history of breast cancer    mother   GERD (gastroesophageal reflux disease)    Hyperlipidemia    Hypertension    Lymphedema of arm 11/24/2011   right arm   MVP (mitral valve prolapse)    PONV (postoperative nausea and vomiting)    has vertigo also-would like scop patch   Syncope 2019   after colonoscopy states "dehydrated"   Thyroid disease    Vertigo    Past Surgical History:  Procedure Laterality Date   BREAST LUMPECTOMY     2001 right   CATARACT EXTRACTION, BILATERAL     COLONOSCOPY  2006   w/Dr.Orr   COLONOSCOPY  2019   fainted afterward at home   ESOPHAGOGASTRODUODENOSCOPY (EGD) WITH PROPOFOL N/A 08/20/2021   Procedure: ESOPHAGOGASTRODUODENOSCOPY (EGD) WITH PROPOFOL;  Surgeon: Carol Ada, MD;  Location: [facility];  Service: Endoscopy;  Laterality: N/A;   FOOT AMPUTATION     right hammer toe   FOOT NEUROMA SURGERY     left   KNEE ARTHROSCOPY     left   MASS EXCISION  07/21/2012   Procedure:  EXCISION MASS;  Surgeon: Adin Hector, MD;  Location: [place];  Service: General;  Laterality: N/A;  excision 15cm posterior neck mass posterior   MASTECTOMY     MASTECTOMY, RADICAL     2009 right   THYROIDECTOMY, PARTIAL     TOTAL HIP ARTHROPLASTY Right 06/21/2019   Procedure: TOTAL HIP ARTHROPLASTY ANTERIOR APPROACH;  Surgeon: Paralee Cancel, MD;  Location: [facility];  Service: Orthopedics;  Laterality: Right;   TOTAL HIP ARTHROPLASTY Left 2022   UPPER GASTROINTESTINAL ENDOSCOPY     Patient Active Problem List   Diagnosis Date Noted   Acute blood loss anemia 06/23/2019   Fall at home, initial encounter 06/23/2019   Hypothyroidism 06/21/2019   Thrombocytopenia ([place]) 06/21/2019   Right femoral fracture ([place]) 06/20/2019   Solitary pulmonary nodule 03/22/2015   Gaseous abdominal distention 03/22/2015   Near syncope 03/21/2015   Orthostatic hypotension 03/21/2015   Dyslipidemia 03/21/2015   Lipoma of neck 07/28/2012   Drug-induced osteoporosis 11/25/2011   Lymphedema of arm 11/24/2011   Goiter, 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    Eval: Pain in L knee, longstanding. She has had cortisone and gel shots in the past. Does not feel that much has helped. Supposed to be having different kind of injection.  Difficulty with initial standing activity, stairs, Has 3-4 steps to enter, 2 hand rails.  L>R bil Pt would like to avoid having knee replacement. She has had 2 hip replacements in the past.   PERTINENT  HISTORY: A-Fib, Previous breast CA, Osteoporosis, bil THA,   PAIN:  Are you having pain? Yes: NPRS scale: 5-8 /10 Pain location: L knee  Pain description: achey Aggravating factors: first thing in AM, initial standing activity.  Relieving factors: none stated   PRECAUTIONS: None  WEIGHT BEARING RESTRICTIONS No  FALLS:  Has patient fallen in last 6 months? No Has fallen 2x/previous years, and has had 2 hip fx.    PLOF: Independent  PATIENT GOALS   avoid knee replacement,    OBJECTIVE:   DIAGNOSTIC FINDINGS:    COGNITION:  Overall cognitive status: Within functional limits for tasks assessed    POSTURE:  L knee significant valgus L>R.  PALPATION:   Tenderness in joint line L>R   LOWER EXTREMITY ROM    Hip ROM: [facility]   Active ROM Right eval Left eval L 07/09/22  Hip flexion     Hip extension     Hip abduction     Hip adduction     Hip internal rotation     Hip external rotation     Knee flexion 120 115 115  Knee extension 0 -3 -3  Ankle dorsiflexion     Ankle plantarflexion     Ankle inversion     Ankle eversion      (Blank rows = not tested)  LOWER EXTREMITY MMT:  MMT Right eval Left eval 07/09/22 R 07/09/22 L  Hip flexion '4 4 4 4  ' Hip extension      Hip abduction 4- 4- 4 4  Hip adduction      Hip internal rotation 4 4    Hip external rotation 4 4    Knee flexion 4 4 4+ 4+  Knee extension '4 4 4 4  ' Ankle dorsiflexion      Ankle plantarflexion      Ankle inversion      Ankle eversion       (Blank rows = not tested)    GAIT:  Slow, cautious gait, significant valgus at L knee.    TODAY'S TREATMENT:  07/10/27 Therapeutic Exercise: Aerobic:  Supine:  Heel slides x 10 bil; Quad sets 2 x 10 bil;  SLR 2 x 10 bil; Clams GTB x 20;  Seated:  LAQ 2lb 2 x 10 bil;  HSC GTB x 15 bil;   Standing:   Stairs up/down 4 in with 1 HR x 4;  With SPC, no hand rails x 5 with education on sequencing and safety.  Stretches:  Neuromuscular Re-education: Manual  Therapy: Self Care:   PATIENT EDUCATION:  Education details: updated and reviewed HEP,  Person educated: Patient Education method: Explanation,  Demonstration, Tactile cues, Verbal cues, and Handouts Education comprehension: verbalized understanding, returned demonstration, verbal cues required, tactile cues required, and needs further education   HOME EXERCISE PROGRAM: Access Code: [email or web address]   ASSESSMENT:  CLINICAL IMPRESSION: Pt has been seen for 5 visits. She is doing well with HEP. Reviewed ther ex appropriate for knee at this time, for final HEP. Discussed importance of continuing HEP for maintaining ROM and strength. She has not had significant improvements of pain with PT. She did have injection today and will see if this helps. Pt will continue to do HEP, and will f/u with MD in 3 months as needed. Ready for d/c to HEP at this time, pt in agreement with plan.   OBJECTIVE IMPAIRMENTS Abnormal gait, decreased activity tolerance, decreased balance, decreased knowledge of use of DME, decreased mobility, difficulty walking, decreased ROM, decreased strength, impaired perceived functional ability, increased muscle spasms, and pain.   ACTIVITY LIMITATIONS lifting, bending, squatting, stairs, transfers, and locomotion level  PARTICIPATION LIMITATIONS: meal prep, cleaning, shopping, and community activity  PERSONAL FACTORS Time since onset of injury/illness/exacerbation are also affecting patient's functional outcome.   REHAB POTENTIAL: Good  CLINICAL DECISION MAKING: Stable/uncomplicated  EVALUATION COMPLEXITY: Low    GOALS: Goals reviewed with patient? Yes  SHORT TERM GOALS: Target date: 06/30/2022   Pt to be independent with initial HEP  Goal status: MET    LONG TERM GOALS: Target date: 07/28/2022   Pt to be independent with final HEP  Goal status: MET  2.  Pt to demo improved strength of bil knees to at least 4+/5 to improve stability and gait   Goal status:  MET  3.  Pt to report decreased pain in bil knees to 0-4/10 with standing activity.  Goal status: IN PROGRESS/partially met   4.  Pt to demo ability for stair navigation, using 1 HR, safety and independently, to improve ability for community navigation.   Goal status: MET     PLAN: PT FREQUENCY: 1-2x/week  PT DURATION: 6 weeks  PLANNED INTERVENTIONS: Therapeutic exercises, Therapeutic activity, Neuromuscular re-education, Balance training, Gait training, Patient/Family education, Self Care, Joint mobilization, Joint manipulation, Stair training, DME instructions, Dry Needling, Electrical stimulation, Spinal manipulation, Spinal mobilization, Cryotherapy, Moist heat, Taping, Vasopneumatic device, Traction, Ionotophoresis 60m/ml Dexamethasone, and Manual therapy  PLAN FOR NEXT SESSION:   LLyndee Hensen PT, DPT 2:31 PM  07/09/22     PHYSICAL THERAPY DISCHARGE SUMMARY  Visits from Start of Care: 5   Plan: Patient agrees to discharge.  Patient goals were partially met. Patient is being discharged due to meeting the stated rehab goals/max benefit reached.     LLyndee Hensen PT, DPT 2:31 PM  07/09/22

## 2022-07-09 NOTE — Patient Instructions (Addendum)
Thank you for coming in today.   You received an injection today. Seek immediate medical attention if the joint becomes red, extremely painful, or is oozing fluid.   Let me know how you feel.   We can do this injection every 3 months.   I need a little warning if you want another one.

## 2022-07-11 ENCOUNTER — Ambulatory Visit (INDEPENDENT_AMBULATORY_CARE_PROVIDER_SITE_OTHER): Payer: Medicare Other | Admitting: Podiatry

## 2022-07-11 ENCOUNTER — Encounter: Payer: Self-pay | Admitting: Podiatry

## 2022-07-11 DIAGNOSIS — L600 Ingrowing nail: Secondary | ICD-10-CM | POA: Diagnosis not present

## 2022-07-11 DIAGNOSIS — L603 Nail dystrophy: Secondary | ICD-10-CM | POA: Diagnosis not present

## 2022-07-11 NOTE — Patient Instructions (Addendum)

## 2022-07-11 NOTE — Progress Notes (Signed)
Subjective:  Patient ID: Jennifer Yang, female    DOB: 29-May-1941,   MRN: [id number]  Chief Complaint  Patient presents with   Nail Problem     Left great toe nail , patient states it is sore    81 y.o. female presents for concern of left hallux nail that seems to be getting ingrown. Requesting to have them trimmed today. Relates burning and tingling in their feet. Patient is not idabetic but is on eliquis. She relates the great toe will need to be numbed and possibly need to pull off the whole nail.    PCP:  [name], MD    . Denies any other pedal complaints. Denies n/v/f/c.   Past Medical History:  Diagnosis Date   Allergy    shell fish   Anxiety    takes xanax on a rare occasion   Arthritis    knees   Benign essential HTN 11/24/2011   Bladder infection 03/18/15   completed antibotics    Breast cancer, right breast ([place]) 02/16/2008   Breast cancer, stage 1 ([place]) 07/16/1990   Cancer ([place])    right Chemo/radiation '01/radical '09   Cataract    Contact lens/glasses fitting    Drug-induced osteoporosis 11/25/2011   Family history of breast cancer    mother   GERD (gastroesophageal reflux disease)    Hyperlipidemia    Hypertension    Lymphedema of arm 11/24/2011   right arm   MVP (mitral valve prolapse)    PONV (postoperative nausea and vomiting)    has vertigo also-would like scop patch   Syncope 2019   after colonoscopy states "dehydrated"   Thyroid disease    Vertigo     Objective:  Physical Exam: Vascular: DP/PT pulses 2/4 bilateral. CFT <3 seconds. Absent hair growth on digits. Edema noted to bilateral lower extremities. Xerosis noted bilaterally.  Skin. No lacerations or abrasions bilateral feet. Left hallux nail with multiple layers noted and abutting distal nail bed with mild erythema noted.  Musculoskeletal: MMT 5/5 bilateral lower extremities in DF, PF, Inversion and Eversion. Deceased ROM in DF of ankle joint.  Neurological: Sensation intact to light touch.  Protective sensation diminished bilateral.    Assessment:   1. Dystrophic nail   2. Ingrown left greater toenail       Plan:  Patient was evaluated and treated and all questions answered. Patient requesting removal of dystrophic nail today. Procedure below.  Discussed procedure and post procedure care and patient expressed understanding.  Will follow-up in 2 weeks for nail check or sooner if any problems arise.    Procedure:  Procedure: total Nail Avulsion of left hallux nail  Surgeon: Lorenda Peck, DPM  Pre-op Dx: Dystrophic nail without infection Post-op: Same  Place of Surgery: Office exam room.  Indications for surgery: Painful and ingrown toenail.    The patient is requesting removal of nail without chemical matrixectomy. Risks and complications were discussed with the patient for which they understand and written consent was obtained. Under sterile conditions a total of 3 mL of  1% lidocaine plain was infiltrated in a hallux block fashion. Once anesthetized, the skin was prepped in sterile fashion. A tourniquet was then applied. Next the top layer of nail was removed exposed a small amount of nail proximally. Distal nail bed appears healthy bleeding tissue. Area copiously irrigated. Betadine was applied. A dry sterile dressing was applied. After application of the dressing the tourniquet was removed and there is found to be an  immediate capillary refill time to the digit. The patient tolerated the procedure well without any complications. Post procedure instructions were discussed the patient for which he verbally understood. Follow-up in two weeks for nail check or sooner if any problems are to arise. Discussed signs/symptoms of infection and directed to call the office immediately should any occur or go directly to the emergency room. In the meantime, encouraged to call the office with any questions, concerns, changes symptoms.    Lorenda Peck, DPM

## 2022-07-23 DIAGNOSIS — I10 Essential (primary) hypertension: Secondary | ICD-10-CM | POA: Diagnosis not present

## 2022-07-29 ENCOUNTER — Encounter: Payer: Self-pay | Admitting: Podiatry

## 2022-07-29 ENCOUNTER — Ambulatory Visit (INDEPENDENT_AMBULATORY_CARE_PROVIDER_SITE_OTHER): Payer: Medicare Other | Admitting: Podiatry

## 2022-07-29 DIAGNOSIS — L600 Ingrowing nail: Secondary | ICD-10-CM

## 2022-07-29 DIAGNOSIS — L603 Nail dystrophy: Secondary | ICD-10-CM

## 2022-07-29 NOTE — Progress Notes (Signed)
  Subjective:  Patient ID: Jennifer Yang, female    DOB: 1941/09/15,   MRN: [id number]  Chief Complaint  Patient presents with   Foot Problem     2 weeks nail check.    81 y.o. female presents for follow-up of left hallux total nail avulsion. Relates she is doing well with minimal pain. Has been soaking and using neosporin as instructed . Denies any other pedal complaints. Denies n/v/f/c.   Past Medical History:  Diagnosis Date   Allergy    shell fish   Anxiety    takes xanax on a rare occasion   Arthritis    knees   Benign essential HTN 11/24/2011   Bladder infection 03/18/15   completed antibotics    Breast cancer, right breast ([place]) 02/16/2008   Breast cancer, stage 1 ([place]) 07/16/1990   Cancer ([place])    right Chemo/radiation '01/radical '09   Cataract    Contact lens/glasses fitting    Drug-induced osteoporosis 11/25/2011   Family history of breast cancer    mother   GERD (gastroesophageal reflux disease)    Hyperlipidemia    Hypertension    Lymphedema of arm 11/24/2011   right arm   MVP (mitral valve prolapse)    PONV (postoperative nausea and vomiting)    has vertigo also-would like scop patch   Syncope 2019   after colonoscopy states "dehydrated"   Thyroid disease    Vertigo     Objective:  Physical Exam: Vascular: DP/PT pulses 2/4 bilateral. CFT <3 seconds. Normal hair growth on digits. No edema.  Skin. No lacerations or abrasions bilateral feet. Let hallux nail bed healing well.  Musculoskeletal: MMT 5/5 bilateral lower extremities in DF, PF, Inversion and Eversion. Deceased ROM in DF of ankle joint.  Neurological: Sensation intact to light touch.   Assessment:  No diagnosis found.   Plan:  Patient was evaluated and treated and all questions answered. Toe was evaluated and appears to be healing well.  May discontinue soaks and neosporin.  Patient to follow-up as needed.    Lorenda Peck, DPM

## 2022-07-31 DIAGNOSIS — N3020 Other chronic cystitis without hematuria: Secondary | ICD-10-CM | POA: Diagnosis not present

## 2022-07-31 DIAGNOSIS — R350 Frequency of micturition: Secondary | ICD-10-CM | POA: Diagnosis not present

## 2022-08-20 DIAGNOSIS — Z23 Encounter for immunization: Secondary | ICD-10-CM | POA: Diagnosis not present

## 2022-09-01 ENCOUNTER — Ambulatory Visit (INDEPENDENT_AMBULATORY_CARE_PROVIDER_SITE_OTHER): Payer: Medicare Other | Admitting: Podiatry

## 2022-09-01 ENCOUNTER — Encounter: Payer: Self-pay | Admitting: Podiatry

## 2022-09-01 DIAGNOSIS — B351 Tinea unguium: Secondary | ICD-10-CM | POA: Diagnosis not present

## 2022-09-01 DIAGNOSIS — L84 Corns and callosities: Secondary | ICD-10-CM | POA: Diagnosis not present

## 2022-09-01 DIAGNOSIS — M79674 Pain in right toe(s): Secondary | ICD-10-CM

## 2022-09-01 DIAGNOSIS — D689 Coagulation defect, unspecified: Secondary | ICD-10-CM

## 2022-09-01 DIAGNOSIS — M79675 Pain in left toe(s): Secondary | ICD-10-CM | POA: Diagnosis not present

## 2022-09-01 NOTE — Progress Notes (Signed)
This patient returns to my office for at risk foot care.  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  This patient is unable to cut nails herself since the patient cannot reach her nails.These nails are painful walking and wearing shoes.  This patient presents for at risk foot care today.  General Appearance  Alert, conversant and in no acute stress.  Vascular  Dorsalis pedis and posterior tibial  pulses are  weakly palpable  bilaterally.  Capillary return is within normal limits  bilaterally. Temperature is within normal limits  bilaterally.  Neurologic  Senn-Weinstein monofilament wire test within normal limits  bilaterally. Muscle power within normal limits bilaterally.  Nails Thick disfigured discolored nails with subungual debris  hallux nails bilaterally. No evidence of bacterial infection or drainage bilaterally.  Orthopedic  No limitations of motion  feet .  No crepitus or effusions noted.  HAV  B/L.  Contracted digits  B/L.  Plantar flexed fifth metatarsal  B/L.  Skin  normotropic skin with no porokeratosis noted bilaterally.  No signs of infections or ulcers noted.     Onychomycosis  Pain in right toes  Pain in left toes  Callus  B/L.  Consent was obtained for treatment procedures.   Mechanical debridement of nails 1-5  bilaterally performed with a nail nipper.  Filed with dremel without incident.    Return office visit    3 months                  Told patient to return for periodic foot care and evaluation due to potential at risk complications.   Gardiner Barefoot DPM

## 2022-09-25 DIAGNOSIS — H26491 Other secondary cataract, right eye: Secondary | ICD-10-CM | POA: Diagnosis not present

## 2022-09-25 DIAGNOSIS — H04123 Dry eye syndrome of bilateral lacrimal glands: Secondary | ICD-10-CM | POA: Diagnosis not present

## 2022-09-25 DIAGNOSIS — Z961 Presence of intraocular lens: Secondary | ICD-10-CM | POA: Diagnosis not present

## 2022-10-14 ENCOUNTER — Ambulatory Visit (INDEPENDENT_AMBULATORY_CARE_PROVIDER_SITE_OTHER): Payer: Medicare Other | Admitting: Family Medicine

## 2022-10-14 ENCOUNTER — Telehealth: Payer: Self-pay | Admitting: *Deleted

## 2022-10-14 ENCOUNTER — Ambulatory Visit: Payer: Self-pay

## 2022-10-14 VITALS — BP 136/80 | HR 45 | Ht 69.0 in | Wt 180.0 lb

## 2022-10-14 DIAGNOSIS — G8929 Other chronic pain: Secondary | ICD-10-CM

## 2022-10-14 DIAGNOSIS — M25562 Pain in left knee: Secondary | ICD-10-CM

## 2022-10-14 DIAGNOSIS — M25511 Pain in right shoulder: Secondary | ICD-10-CM

## 2022-10-14 DIAGNOSIS — M1712 Unilateral primary osteoarthritis, left knee: Secondary | ICD-10-CM

## 2022-10-14 NOTE — Progress Notes (Signed)
Rito Ehrlich, am serving as a Education administrator for Dr. Lynne Leader.  Jennifer Yang is a 81 y.o. female who presents to [place] at [facility] today for new R shoulder pain and re-check L knee pain. Pt was last seen by Dr. Georgina Snell on 07/09/22 was given a left knee Zilretta injection.  Patient completed prior course of PT, 4 visits, ending on 8/23.  Today, patient reports L knee pain has returned been going on about a month wanting to see when she is due for the zilretta injection  Patient is also experiencing R shoulder pain started Sunday night.  Patient locates pain to top of the shoulder. States the pain is better than it has been. Patient states she has fluid in her right arm from the lack of lymph nodes.   Neck pain no  Radiates: down the arm  UE Numbness/tingling: no  UE Weakness: no  Aggravates: lifting arm  Treatments tried: tylenol   Dx imaging: 06/04/22 L knee XR at [place] office   Pertinent review of systems: No fevers or chills  Relevant historical information: Breast cancer right side.  Some lymphedema right arm.   Exam:  BP 136/80   Pulse (!) 45   Ht '5\' 9"'$  (1.753 m)   Wt 180 lb (81.6 kg)   SpO2 99%   BMI 26.58 kg/m  General: Well Developed, well nourished, and in no acute distress.   MSK: Right shoulder normal appearing Minimal lymphedema right upper arm. Normal shoulder motion pain with abduction. Tender palpation right AC joint. Minimally positive Hawkins and Neer's test.  Positive crossover arm compression test.  Mildly positive empty can test. Negative Yergason's and speeds test. Intact strength.    Lab and Radiology Results  Procedure: Real-time Ultrasound Guided Injection of the right [facility] joint Device: Philips Affiniti 50G Images permanently stored and available for review in PACS Ultrasound evaluation does not show significant bursitis.  AC DJD with effusion is present. Verbal informed consent obtained.  Discussed risks and benefits  of procedure. Warned about infection, bleeding, hyperglycemia damage to structures among others. Patient expresses understanding and agreement Time-out conducted.   Noted no overlying erythema, induration, or other signs of local infection.   Skin prepped in a sterile fashion.   Local anesthesia: Topical Ethyl chloride.   With sterile technique and under real time ultrasound guidance: One half (1.11m) of a 17m'80mg'$  Depo-Medrol and 2 mL Marcaine solution (total Depo-Medrol dose was 40 mg) of a  injected into AC joint. Fluid seen entering the joint capsule.   Completed without difficulty   Pain moderately resolved suggesting accurate placement of the medication.   Advised to call if fevers/chills, erythema, induration, drainage, or persistent bleeding.   Images permanently stored and available for review in the ultrasound unit.  Impression: Technically successful ultrasound guided injection.         Assessment and Plan: [age].o. 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  Some of her pain may be rotator cuff tendinopathy.  If not improving would consider subacromial injection.  Additionally she has some recurrent left knee pain due to DJD.  She last had a Zilretta injection 3 months ago.  She will return approximately next week for Zilretta injection.  Will make sure that we have this medicine in stock.  She should be authorized it has been long enough that we can repeat this injection now.   PDMP not reviewed this encounter. Orders Placed This Encounter  Procedures  [place] LIMITED JOINT SPACE STRUCTURES LOW LEFT(NO LINKED CHARGES)    Order Specific Question:   Reason for Exam (SYMPTOM  OR DIAGNOSIS REQUIRED)    Answer:   Left knee pain    Order Specific Question:   Preferred imaging location?    Answer:   [place]   No orders of the defined types were placed in this encounter.    Discussed warning signs or symptoms. Please see discharge  instructions. Patient expresses understanding.   The above documentation has been reviewed and is accurate and complete Lynne Leader, M.D.

## 2022-10-14 NOTE — Telephone Encounter (Signed)
L knee Zilretta initiated through portal.

## 2022-10-14 NOTE — Patient Instructions (Signed)
Thank you for coming in today.    Call or go to the ER if you develop a large red swollen joint with extreme pain or oozing puss.    You should hear from [place] about the Zilretta for the left knee.   If we have not called you next week as [place].

## 2022-10-15 ENCOUNTER — Telehealth: Payer: Self-pay | Admitting: Family Medicine

## 2022-10-15 NOTE — Telephone Encounter (Signed)
Called patient back to inform her that it I normal to be pretty painful after the numbing medication wears off and that as long as she continues to notice improvement through the day and not getting back to the extreme pain that everything is okay. Patient will let us know if the pain gets worse.

## 2022-10-15 NOTE — Telephone Encounter (Signed)
L knee Zilretta approved. No pre-cert required.

## 2022-10-15 NOTE — Telephone Encounter (Signed)
Jennifer Yang called stating that she was here to see Dr Georgina Snell yesterday and had a right shoulder injection.  She said that last night it was extremely painful.  It is a little better this morning but is still causing some pain.  She asked if this was normal after an injection?  Please advise.

## 2022-10-16 NOTE — Telephone Encounter (Signed)
Scheduled

## 2022-10-21 NOTE — Progress Notes (Unsigned)
   I, Peterson Lombard, LAT, ATC acting as a scribe for Lynne Leader, MD.  Jennifer Yang is a 81 y.o. female who presents to [place] at [facility] today for for a repeat left knee Zilretta injection.  Patient was last seen by Dr. Georgina Snell on 10/14/2022 and the visit was focused on her right shoulder.  Today, patient reports left knee pain returning?  Patient is wanting to try another Zilretta injection today.  Dx imaging: 06/04/22 L knee XR at [place] office    Pertinent review of systems: ***  Relevant historical information: ***   Exam:  There were no vitals taken for this visit. General: Well Developed, well nourished, and in no acute distress.   MSK: ***    Lab and Radiology Results No results found for this or any previous visit (from the past 72 hour(s)). No results found.     Assessment and Plan: 81 y.o. female with ***   PDMP not reviewed this encounter. No orders of the defined types were placed in this encounter.  No orders of the defined types were placed in this encounter.    Discussed warning signs or symptoms. Please see discharge instructions. Patient expresses understanding.   ***

## 2022-10-22 ENCOUNTER — Ambulatory Visit: Payer: Self-pay

## 2022-10-22 ENCOUNTER — Ambulatory Visit (INDEPENDENT_AMBULATORY_CARE_PROVIDER_SITE_OTHER): Payer: Medicare Other | Admitting: Family Medicine

## 2022-10-22 VITALS — BP 136/80 | HR 64 | Ht 69.0 in | Wt 180.0 lb

## 2022-10-22 DIAGNOSIS — M25562 Pain in left knee: Secondary | ICD-10-CM | POA: Diagnosis not present

## 2022-10-22 DIAGNOSIS — M1712 Unilateral primary osteoarthritis, left knee: Secondary | ICD-10-CM

## 2022-10-22 DIAGNOSIS — G8929 Other chronic pain: Secondary | ICD-10-CM

## 2022-10-22 MED ORDER — TRIAMCINOLONE ACETONIDE 32 MG IX SRER
32.0000 mg | Freq: Once | INTRA_ARTICULAR | Status: AC
  Administered 2022-10-22: 32 mg via INTRA_ARTICULAR

## 2022-10-22 NOTE — Patient Instructions (Signed)
Thank you for coming in today.   You received an injection today. Seek immediate medical attention if the joint becomes red, extremely painful, or is oozing fluid.  

## 2022-11-04 DIAGNOSIS — Z170 Estrogen receptor positive status [ER+]: Secondary | ICD-10-CM | POA: Diagnosis not present

## 2022-11-04 DIAGNOSIS — C50911 Malignant neoplasm of unspecified site of right female breast: Secondary | ICD-10-CM | POA: Diagnosis not present

## 2022-11-22 DIAGNOSIS — I10 Essential (primary) hypertension: Secondary | ICD-10-CM | POA: Diagnosis not present

## 2022-12-03 ENCOUNTER — Ambulatory Visit: Payer: Medicare Other | Admitting: Podiatry

## 2022-12-18 DIAGNOSIS — L570 Actinic keratosis: Secondary | ICD-10-CM | POA: Diagnosis not present

## 2022-12-18 DIAGNOSIS — L814 Other melanin hyperpigmentation: Secondary | ICD-10-CM | POA: Diagnosis not present

## 2022-12-18 DIAGNOSIS — L821 Other seborrheic keratosis: Secondary | ICD-10-CM | POA: Diagnosis not present

## 2022-12-18 DIAGNOSIS — D225 Melanocytic nevi of trunk: Secondary | ICD-10-CM | POA: Diagnosis not present

## 2022-12-23 DIAGNOSIS — I10 Essential (primary) hypertension: Secondary | ICD-10-CM | POA: Diagnosis not present

## 2022-12-25 ENCOUNTER — Ambulatory Visit: Payer: Medicare Other | Admitting: Cardiology

## 2022-12-25 ENCOUNTER — Encounter: Payer: Self-pay | Admitting: Cardiology

## 2022-12-25 ENCOUNTER — Telehealth: Payer: Self-pay

## 2022-12-25 VITALS — BP 122/57 | HR 45 | Resp 16 | Ht 69.0 in | Wt 176.8 lb

## 2022-12-25 DIAGNOSIS — R001 Bradycardia, unspecified: Secondary | ICD-10-CM

## 2022-12-25 DIAGNOSIS — I129 Hypertensive chronic kidney disease with stage 1 through stage 4 chronic kidney disease, or unspecified chronic kidney disease: Secondary | ICD-10-CM | POA: Diagnosis not present

## 2022-12-25 DIAGNOSIS — N1832 Chronic kidney disease, stage 3b: Secondary | ICD-10-CM

## 2022-12-25 DIAGNOSIS — I480 Paroxysmal atrial fibrillation: Secondary | ICD-10-CM

## 2022-12-25 DIAGNOSIS — I10 Essential (primary) hypertension: Secondary | ICD-10-CM

## 2022-12-25 MED ORDER — APIXABAN 5 MG PO TABS: 5.0000 mg | ORAL_TABLET | Freq: Two times a day (BID) | ORAL | 3 refills | Status: DC

## 2022-12-25 NOTE — Progress Notes (Signed)
Primary Physician/Referring:  [name], MD  Patient ID: Jennifer Yang, female    DOB: 30-May-1941, 82 y.o.   MRN: [id number]  Chief Complaint  Patient presents with   Atrial Fibrillation   Hypertension   Follow-up    6 months   HPI:    Jennifer Yang  is a 82 y.o. Caucasian female patient with hypertension, hyperlipidemia, prediabetes mellitus, had a accidental fall and had left femoral fracture in the last week of July 2022 and was admitted to [facility] and had new onset A. fib with RVR, spontaneously converted back to sinus rhythm but had a second episode, was started on metoprolol along with diltiazem and discharged home.  She presents for follow-up and evaluation of bradycardia and also leg edema and hypertension.  She is accompanied by her husband, no specific complaints today. Past Medical History:  Diagnosis Date   Allergy    shell fish   Anxiety    takes xanax on a rare occasion   Arthritis    knees   Benign essential HTN 11/24/2011   Bladder infection 03/18/15   completed antibotics    Breast cancer, right breast ([place]) 02/16/2008   Breast cancer, stage 1 ([place]) 07/16/1990   Cancer ([place])    right Chemo/radiation '01/radical '09   Cataract    Contact lens/glasses fitting    Drug-induced osteoporosis 11/25/2011   Family history of breast cancer    mother   GERD (gastroesophageal reflux disease)    Hyperlipidemia    Hypertension    Lymphedema of arm 11/24/2011   right arm   MVP (mitral valve prolapse)    PONV (postoperative nausea and vomiting)    has vertigo also-would like scop patch   Syncope 2019   after colonoscopy states "dehydrated"   Thyroid disease    Vertigo    Social History   Tobacco Use   Smoking status: Never   Smokeless tobacco: Never  Substance Use Topics   Alcohol use: No    Alcohol/week: 0.0 standard drinks of alcohol   Marital Status: Married  ROS  Review of Systems  Cardiovascular:  Negative for chest pain, dyspnea on exertion,  leg swelling and palpitations.  Musculoskeletal:  Positive for arthritis and joint pain.  Gastrointestinal:  Negative for melena.  Neurological:  Negative for dizziness.   Objective  Blood pressure (!) 122/57, pulse (!) 45, resp. rate 16, height '5\' 9"'$  (1.753 m), weight 176 lb 12.8 oz (80.2 kg), SpO2 98 %. Body mass index is 26.11 kg/m.     12/25/2022    8:43 AM 10/22/2022    9:51 AM 10/14/2022    2:11 PM  Vitals with BMI  Height '5\' 9"'$  '5\' 9"'$  '5\' 9"'$   Weight 176 lbs 13 oz 180 lbs 180 lbs  BMI 26.1 34.19 62.22  Systolic 979 892 119  Diastolic 57 80 80  Pulse 45 64 45     Physical Exam Neck:     Vascular: No carotid bruit or JVD.  Cardiovascular:     Rate and Rhythm: Regular rhythm. Bradycardia present.     Pulses: Intact distal pulses.     Heart sounds: Normal heart sounds. No murmur heard.    No gallop.  Pulmonary:     Effort: Pulmonary effort is normal.     Breath sounds: Normal breath sounds.  Abdominal:     General: Bowel sounds are normal.     Palpations: Abdomen is soft.  Musculoskeletal:     Right lower leg:  No edema.     Left lower leg: No edema.      Laboratory examination:   Recent Labs    05/13/22 1000  NA 142  K 4.4  CL 108  CO2 26  GLUCOSE 116*  BUN 24*  CREATININE 1.30*  CALCIUM 10.0  GFRNONAA 42*      Latest Ref Rng & Units 05/13/2022   10:00 AM 12/17/2021    1:17 PM 05/13/2021    9:18 AM  CMP  Glucose 70 - 99 mg/dL 116  89  118   BUN 8 - 23 mg/dL '24  24  22   '$ Creatinine 0.44 - 1.00 mg/dL 1.30  1.30  1.10   Sodium 135 - 145 mmol/L 142  146  141   Potassium 3.5 - 5.1 mmol/L 4.4  4.9  4.9   Chloride 98 - 111 mmol/L 108  107  105   CO2 22 - 32 mmol/L '26  17  27   '$ Calcium 8.9 - 10.3 mg/dL 10.0  10.1  10.1   Total Protein 6.5 - 8.1 g/dL 7.1   7.0   Total Bilirubin 0.3 - 1.2 mg/dL 1.2   1.2   Alkaline Phos 38 - 126 U/L 39   47   AST 15 - 41 U/L 14   16   ALT 0 - 44 U/L 7   10       Latest Ref Rng & Units 05/13/2022   10:00 AM 05/13/2021     9:18 AM 05/10/2020    7:47 AM  CBC  WBC 4.0 - 10.5 K/uL 5.8  5.9  6.7   Hemoglobin 12.0 - 15.0 g/dL 12.1  12.7  13.4   Hematocrit 36.0 - 46.0 % 37.6  40.3  41.7   Platelets 150 - 400 K/uL 168  160  144     External labs:   Labs 05/19/2022:  A1c 5.9%.  Vitamin D 31.9.  BUN 21, creatinine 1.37, EGFR 36,, potassium 4.3, LFTs normal.  TSH 1.46.  Total cholesterol 152, triglycerides 122, HDL 49, LDL 79.  Labs 02/10/2022:  Hb 12.3/HCT 38.4, platelets 152, normal indicis.  Medications and allergies   Allergies  Allergen Reactions   Codeine Sulfate Swelling   Fish-Derived Products Other (See Comments)    Cellulitis    Rocephin [Ceftriaxone Sodium] Other (See Comments)    Low blood count   Sulfa Antibiotics Other (See Comments)    Mouth and lip sores     Medication list after today's encounter    Current Outpatient Medications:    ALPRAZolam (XANAX) 0.5 MG tablet, Take 0.5 mg by mouth daily as needed for anxiety. , Disp: , Rfl:    amLODipine (NORVASC) 5 MG tablet, Take 5 mg by mouth daily., Disp: , Rfl:    fluticasone (FLONASE) 50 MCG/ACT nasal spray, Place 1-2 sprays into both nostrils daily as needed for allergies or rhinitis., Disp: , Rfl:    losartan (COZAAR) 100 MG tablet, Take 100 mg by mouth daily. , Disp: , Rfl:    metoprolol succinate (TOPROL-XL) 25 MG 24 hr tablet, Take 1 tablet (25 mg total) by mouth daily. Take with or immediately following a meal., Disp: 90 tablet, Rfl: 3   omeprazole (PRILOSEC) 40 MG capsule, Take 40 mg by mouth as needed., Disp: , Rfl:    Probiotic Product (ALIGN PO), Take by mouth., Disp: , Rfl:    rosuvastatin (CRESTOR) 5 MG tablet, Take 5 mg by mouth daily., Disp: , Rfl: 0  spironolactone (ALDACTONE) 25 MG tablet, Take 1 tablet (25 mg total) by mouth every morning., Disp: 90 tablet, Rfl: 3   apixaban (ELIQUIS) 5 MG TABS tablet, Take 1 tablet (5 mg total) by mouth 2 (two) times daily., Disp: 200 tablet, Rfl: 3  Radiology:   CT of the chest  04/28/2016: 1. No acute cardiopulmonary abnormalities. 2. Aortic atherosclerosis and multi vessel coronary artery calcification. 3. 2 mm nodule in the anterior right upper lobe is stable from previous exam and is compatible with a benign etiology.  OTHER:   Upper GI endoscopy 08/20/2021: Reflux esophagitis with no bleeding.  3 cm hiatal hernia.  Colonoscopy performed on 05/25/2019 revealing 2 1 to 2 mm polyps and mild diverticulosis and nonbleeding internal hemorrhoids.  Cardiac Studies:  [facility], [place], [place]  Echocardiogram 07/16/2021: Normal LV size and normal LV systolic function, EF 69%.  Mild LVH. Normal RV systolic function and normal size. Trivial pericardial effusion.  EKG:   EKG 10/25/2023: Marked sinus bradycardia at the rate of 44 bpm, incomplete right bundle branch block.  Otherwise normal.  Compared to 06/27/2022, no change.  EKG 08/15/2021: Atrial fibrillation with rapid ventricular response at rate of 159 bpm.  Poor R wave progression.  Low-voltage complexes.  Nonspecific T abnormality.  Assessment     ICD-10-CM   1. Paroxysmal atrial fibrillation (HCC)  I48.0 EKG 12-Lead    apixaban (ELIQUIS) 5 MG TABS tablet    2. Primary hypertension  I10     3. Bradycardia by electrocardiogram  R00.1     4. Stage 3b chronic kidney disease (HCC)  N18.32        CHA2DS2-VASc Score is 6.  Yearly risk of stroke: 9.8% (A, F, HTN, Pre DM, Coronary atherosclerosis).  Score of 1=0.6; 2=2.2; 3=3.2; 4=4.8; 5=7.2; 6=9.8; 7=>9.8) -(CHF; HTN; vasc disease DM,  Female = 1; Age <65 =0; 65-74 = 1,  >75 =2; stroke/embolism= 2).    Medications Discontinued During This Encounter  Medication Reason   trimethoprim (TRIMPEX) 100 MG tablet Completed Course   apixaban (ELIQUIS) 5 MG TABS tablet Reorder    Meds ordered this encounter  Medications   apixaban (ELIQUIS) 5 MG TABS tablet    Sig: Take 1 tablet (5 mg total) by mouth 2 (two) times daily.    Dispense:  200 tablet    Refill:  3     Hold Rx until patient calls   Orders Placed This Encounter  Procedures   EKG 12-Lead   Recommendations:   Jennifer Yang is a 82 y.o. Caucasian female patient with hypertension, hyperlipidemia, prediabetes mellitus, had a accidental fall and had left femoral fracture in the last week of July 2022 and admitted to [facility] and had new onset A. fib with RVR, spontaneously converted back to sinus rhythm but had a second episode, was started on metoprolol along with diltiazem and discharged home.  1. Paroxysmal atrial fibrillation ([place]) Patient is maintaining sinus rhythm, in fact has sinus bradycardia but is completely asymptomatic.  She is presently 82 years of age, will continue to monitor this on annual basis regarding bradycardia as well.  Continue anticoagulation in view of high CHA2DS2-VASc score.  2. Primary hypertension Blood pressure is under excellent control.  Continue present medications.  Spironolactone has stabilized her blood pressure and also her leg edema has completely resolved.  Renal function has remained stable.  3. Bradycardia by electrocardiogram Asymptomatic sinus bradycardia.  No need to change beta-blocker dose.  4. Stage 3b chronic kidney  disease ([place]) Patient's renal function has remained stable.  I do not have recent labs.  She was evaluated by Dr. Shelia Media recently and was told to have stable renal function.  I reviewed her RPM data, blood pressure under excellent control, will discontinue monitoring for now.  I will see her back on annual basis.    Patient home BP is well controlled on current antihypertensive regimen.   Systolic Blood Pressure          120.4 (409.8 - 119.1) Diastolic Blood Pressure        66.2 (63.0 - 76.0) Heart Rate       50.0 (45.0 - 54.0)   12/24/22 7:39 AM           Blip 800           119      /           64        mmHg  45        bpm      12/22/22 6:28 AM           Blip 800           129      /           76        mmHg  54         bpm      12/21/22 9:06 AM           Blip 800           114      /           63        mmHg  53        bpm      12/20/22 8:48 AM           Blip 800           119      /           64        mmHg  52        bpm      12/18/22 8:14 PM           Blip 800           121      /           64        mmHg  46        bpm    With regard to anticoagulation, hemoglobin has remained stable, no bleeding diathesis.  She has not had any TIA-like symptoms.  I will see him back in 6 months for follow-up.   Adrian Prows, MD, [facility] 12/25/2022, 9:31 AM Office: [phone]

## 2022-12-25 NOTE — Telephone Encounter (Signed)
Patient home BP is well controlled on current antihypertensive regimen.  Systolic Blood Pressure 854.6 (270.3 - 500.9) Diastolic Blood Pressure 38.1 (63.0 - 76.0) Heart Rate 50.0 (45.0 - 54.0)  12/24/22 7:39 AM Blip 800 119 / 64 mmHg 45 bpm  12/22/22 6:28 AM Blip 800 129 / 76 mmHg 54 bpm  12/21/22 9:06 AM Blip 800 114 / 63 mmHg 53 bpm  12/20/22 8:48 AM Blip 800 119 / 64 mmHg 52 bpm  12/18/22 8:14 PM Blip 800 121 / 64 mmHg 46 bpm

## 2022-12-26 ENCOUNTER — Ambulatory Visit (INDEPENDENT_AMBULATORY_CARE_PROVIDER_SITE_OTHER): Payer: Medicare Other | Admitting: Podiatry

## 2022-12-26 ENCOUNTER — Encounter: Payer: Self-pay | Admitting: Hematology & Oncology

## 2022-12-26 ENCOUNTER — Encounter: Payer: Self-pay | Admitting: Podiatry

## 2022-12-26 DIAGNOSIS — L84 Corns and callosities: Secondary | ICD-10-CM

## 2022-12-26 DIAGNOSIS — D689 Coagulation defect, unspecified: Secondary | ICD-10-CM | POA: Diagnosis not present

## 2022-12-26 DIAGNOSIS — B351 Tinea unguium: Secondary | ICD-10-CM | POA: Diagnosis not present

## 2022-12-26 DIAGNOSIS — M79674 Pain in right toe(s): Secondary | ICD-10-CM | POA: Diagnosis not present

## 2022-12-26 DIAGNOSIS — M79675 Pain in left toe(s): Secondary | ICD-10-CM | POA: Diagnosis not present

## 2022-12-26 NOTE — Progress Notes (Signed)
This patient returns to my office for at risk foot care.  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  This patient is unable to cut nails herself since the patient cannot reach her nails.These nails are painful walking and wearing shoes.  She says the callus on her feet are painful walking.This patient presents for at risk foot care today.  General Appearance  Alert, conversant and in no acute stress.  Vascular  Dorsalis pedis and posterior tibial  pulses are  weakly palpable  bilaterally.  Capillary return is within normal limits  bilaterally. Temperature is within normal limits  bilaterally.  Neurologic  Senn-Weinstein monofilament wire test within normal limits  bilaterally. Muscle power within normal limits bilaterally.  Nails Thick disfigured discolored nails with subungual debris  hallux nails bilaterally. No evidence of bacterial infection or drainage bilaterally.  Orthopedic  No limitations of motion  feet .  No crepitus or effusions noted.  HAV  B/L.  Contracted digits  B/L.  Plantar flexed fifth metatarsal  B/L.  Cavus foot type.  Skin  normotropic skin with no porokeratosis noted bilaterally.  No signs of infections or ulcers noted.   Callus sub 5th  B/L and callus sub 2 right foot.  Onychomycosis  Pain in right toes  Pain in left toes  Callus  B/L.  Consent was obtained for treatment procedures.   Mechanical debridement of nails 1-5  bilaterally performed with a nail nipper.  Filed with dremel without incident.    Return office visit    3 months                  Told patient to return for periodic foot care and evaluation due to potential at risk complications.   Gardiner Barefoot DPM

## 2023-01-14 DIAGNOSIS — R350 Frequency of micturition: Secondary | ICD-10-CM | POA: Diagnosis not present

## 2023-01-14 DIAGNOSIS — N3020 Other chronic cystitis without hematuria: Secondary | ICD-10-CM | POA: Diagnosis not present

## 2023-01-15 ENCOUNTER — Other Ambulatory Visit: Payer: Self-pay | Admitting: Cardiology

## 2023-01-15 DIAGNOSIS — I10 Essential (primary) hypertension: Secondary | ICD-10-CM

## 2023-02-03 ENCOUNTER — Telehealth: Payer: Self-pay | Admitting: *Deleted

## 2023-02-03 NOTE — Patient Outreach (Signed)
  Care Coordination   Initial Visit Note   02/03/2023 Name: Jennifer Yang MRN: [id number] DOB: 01-15-1941  Jennifer Yang is a 82 y.o. year old female who sees [name], MD for primary care. I spoke with  [name] by phone today.  What matters to the patients health and wellness today?  Pt wants to think about participating in the Care Coordination program and is agreeable to a follow up call in a few weeks.    Goals Addressed   None     SDOH assessments and interventions completed:  No     Care Coordination Interventions:  No, not indicated   Follow up plan: Follow up call scheduled for 2 weeks    Encounter Outcome:  Pt. Scheduled

## 2023-02-11 ENCOUNTER — Telehealth: Payer: Self-pay

## 2023-02-11 NOTE — Telephone Encounter (Signed)
Patient called stating she is ready to get her zilretta injection again and would like a call once that is approved and in stock

## 2023-02-16 DIAGNOSIS — M810 Age-related osteoporosis without current pathological fracture: Secondary | ICD-10-CM | POA: Diagnosis not present

## 2023-02-16 DIAGNOSIS — E1122 Type 2 diabetes mellitus with diabetic chronic kidney disease: Secondary | ICD-10-CM | POA: Diagnosis not present

## 2023-02-16 DIAGNOSIS — Z1231 Encounter for screening mammogram for malignant neoplasm of breast: Secondary | ICD-10-CM | POA: Diagnosis not present

## 2023-02-16 DIAGNOSIS — R011 Cardiac murmur, unspecified: Secondary | ICD-10-CM | POA: Diagnosis not present

## 2023-02-16 DIAGNOSIS — N390 Urinary tract infection, site not specified: Secondary | ICD-10-CM | POA: Diagnosis not present

## 2023-02-16 NOTE — Telephone Encounter (Signed)
Received Zilretta injection for LEFT knee OA  Last Ziletta inj 10/22/22 Next Zilretta inj due 01/15/23  VOB initiated for Zilretta inj for LEFT knee OA

## 2023-02-16 NOTE — Telephone Encounter (Signed)
Patient called back in regard to this injection. Informed patient that we are waiting the response form her insurance and once it is authorized we will call her to schedule. Also informed her that once we get it authorized we will order the medication so it will be here when we schedule her.

## 2023-02-17 NOTE — Telephone Encounter (Signed)
Zilretta for LEFT knee OA  Primary: Medicare Zilretta co-insurance: 20% U/S guidance ([id number]) co-insurance: 20%  Secondary: [employer] Medicare Adv Plan G  Zilretta co-insurance: Covers Medicare Part B co-insurance U/S guidance ([id number]) co-insurance: Covers Medicare Part B co-insurance  Deductible: --- pending ---  Prior Auth: NOT required PA# Valid:     ** This summary of benefits is an estimation of the patient's out-of-pocket cost. Exact cost may very based on individual plan coverage.

## 2023-02-20 NOTE — Telephone Encounter (Signed)
Appointment scheduled.

## 2023-02-23 DIAGNOSIS — E7800 Pure hypercholesterolemia, unspecified: Secondary | ICD-10-CM | POA: Diagnosis not present

## 2023-02-23 DIAGNOSIS — I480 Paroxysmal atrial fibrillation: Secondary | ICD-10-CM | POA: Diagnosis not present

## 2023-02-23 DIAGNOSIS — Z0000 Encounter for general adult medical examination without abnormal findings: Secondary | ICD-10-CM | POA: Diagnosis not present

## 2023-02-23 DIAGNOSIS — F321 Major depressive disorder, single episode, moderate: Secondary | ICD-10-CM | POA: Diagnosis not present

## 2023-02-23 DIAGNOSIS — E1121 Type 2 diabetes mellitus with diabetic nephropathy: Secondary | ICD-10-CM | POA: Diagnosis not present

## 2023-02-23 DIAGNOSIS — I10 Essential (primary) hypertension: Secondary | ICD-10-CM | POA: Diagnosis not present

## 2023-02-23 DIAGNOSIS — N1831 Chronic kidney disease, stage 3a: Secondary | ICD-10-CM | POA: Diagnosis not present

## 2023-02-23 DIAGNOSIS — I700 Atherosclerosis of aorta: Secondary | ICD-10-CM | POA: Diagnosis not present

## 2023-02-23 DIAGNOSIS — I4891 Unspecified atrial fibrillation: Secondary | ICD-10-CM | POA: Diagnosis not present

## 2023-02-24 NOTE — Progress Notes (Signed)
   I, Stevenson Clinch, CMA acting as a scribe for Clementeen Graham, MD.  Jennifer Yang is a 82 y.o. female who presents to [facility] today for cont'd L knee pain. Pt was last seen by Dr. Denyse Amass on 10/22/22 and had a L knee Zilretta injection Pt called the office on 02/11/23 requesting Zilretta be re-authorized for a repeat injection. Today, pt reports worsening pain and inflammation today. Would like Zilretta injection for LEFT knee OA.   Dx imaging: 06/04/22 L knee XR (done at [facility])  Pertinent review of systems: No fevers or chills  Relevant historical information: History of hip replacement.  Would very much like to avoid total knee replacement if possible.   Exam:  BP 126/66   Pulse (!) 54   Ht 5\' 9"  (1.753 m)   Wt 183 lb 12.8 oz (83.4 kg)   SpO2 98%   BMI 27.14 kg/m  General: Well Developed, well nourished, and in no acute distress.   MSK: Left knee mild effusion normal.  Normal motion.    Lab and Radiology Results  Zilretta Injection left knee Procedure: Real-time Ultrasound Guided Injection of left knee joint superior lateral patellar space Device: Philips Affiniti 50G Images permanently stored and available for review in PACS Verbal informed consent obtained.  Discussed risks and benefits of procedure. Warned about infection, bleeding, hyperglycemia damage to structures among others. Patient expresses understanding and agreement Time-out conducted.   Noted no overlying erythema, induration, or other signs of local infection.   Skin prepped in a sterile fashion.   Local anesthesia: Topical Ethyl chloride.   With sterile technique and under real time ultrasound guidance: Zilretta 32 mg injected into knee joint. Fluid seen entering the joint capsule.   Completed without difficulty   Advised to call if fevers/chills, erythema, induration, drainage, or persistent bleeding.   Images permanently stored and available for review in the  ultrasound unit.  Impression: Technically successful ultrasound guided injection. Lot number: [id number]       Assessment and Plan: 82 y.o. female with left knee pain thought to be due to DJD.  This is an acute exacerbation of chronic problem.  Plan for Zilretta injection today.  That seems to be lasting longer than 3 months which is helpful.  Ultimately it is likely that she will require knee replacement.  She is not ready to do that at this time.  Hopefully the Zilretta injections will give her some time to process and plan.   PDMP not reviewed this encounter. Orders Placed This Encounter  Procedures   [place] LIMITED JOINT SPACE STRUCTURES LOW LEFT(NO LINKED CHARGES)    Order Specific Question:   Reason for Exam (SYMPTOM  OR DIAGNOSIS REQUIRED)    Answer:   left knee pain    Order Specific Question:   Preferred imaging location?    Answer:   Adult nurse [facility] ordered this encounter  Medications   Triamcinolone Acetonide (ZILRETTA) intra-articular injection 32 mg     Discussed warning signs or symptoms. Please see discharge instructions. Patient expresses understanding.   The above documentation has been reviewed and is accurate and complete Clementeen Graham, M.D.

## 2023-02-25 ENCOUNTER — Ambulatory Visit (INDEPENDENT_AMBULATORY_CARE_PROVIDER_SITE_OTHER): Payer: Medicare Other | Admitting: Family Medicine

## 2023-02-25 ENCOUNTER — Encounter: Payer: Self-pay | Admitting: Family Medicine

## 2023-02-25 ENCOUNTER — Other Ambulatory Visit: Payer: Self-pay

## 2023-02-25 VITALS — BP 126/66 | HR 54 | Ht 69.0 in | Wt 183.8 lb

## 2023-02-25 DIAGNOSIS — G8929 Other chronic pain: Secondary | ICD-10-CM

## 2023-02-25 DIAGNOSIS — M25562 Pain in left knee: Secondary | ICD-10-CM

## 2023-02-25 DIAGNOSIS — M1712 Unilateral primary osteoarthritis, left knee: Secondary | ICD-10-CM | POA: Diagnosis not present

## 2023-02-25 MED ORDER — TRIAMCINOLONE ACETONIDE 32 MG IX SRER
32.0000 mg | Freq: Once | INTRA_ARTICULAR | Status: AC
  Administered 2023-02-25: 32 mg via INTRA_ARTICULAR

## 2023-02-25 NOTE — Telephone Encounter (Signed)
Pt received Zilretta inj for LEFT knee OA 02/25/23 Can consider repeat inj 05/21/23

## 2023-02-25 NOTE — Patient Instructions (Addendum)
Thank you for coming in today.   You received an injection today. Seek immediate medical attention if the joint becomes red, extremely painful, or is oozing fluid.   Communicate in the future if we need to re-authorize Zilretta for future injections.

## 2023-03-10 ENCOUNTER — Encounter: Payer: Self-pay | Admitting: Cardiology

## 2023-03-10 ENCOUNTER — Ambulatory Visit: Payer: Medicare Other | Admitting: Cardiology

## 2023-03-10 VITALS — BP 147/65 | HR 51 | Resp 16 | Ht 69.0 in | Wt 175.4 lb

## 2023-03-10 DIAGNOSIS — I10 Essential (primary) hypertension: Secondary | ICD-10-CM

## 2023-03-10 DIAGNOSIS — N1832 Chronic kidney disease, stage 3b: Secondary | ICD-10-CM

## 2023-03-10 MED ORDER — HYDRALAZINE HCL 50 MG PO TABS
50.0000 mg | ORAL_TABLET | ORAL | 3 refills | Status: DC
Start: 2023-03-10 — End: 2023-04-21

## 2023-03-10 MED ORDER — AMLODIPINE BESYLATE 10 MG PO TABS
10.0000 mg | ORAL_TABLET | Freq: Every day | ORAL | 3 refills | Status: DC
Start: 2023-03-10 — End: 2023-09-08

## 2023-03-10 NOTE — Progress Notes (Signed)
Primary Physician/Referring:  Merri Brunette, MD  Patient ID: Jennifer Yang, female    DOB: 1941-01-28, 82 y.o.   MRN: [id number]  Chief Complaint  Patient presents with   Hypertension   Follow-up   HPI:    Jennifer Yang  is a 82 y.o. Caucasian female patient with hypertension, hyperlipidemia, prediabetes mellitus, had a accidental fall and had left femoral fracture in the last week of July 2022 and was admitted to [facility] and had new onset A. fib with RVR, spontaneously converted back to sinus rhythm but had a second episode, was started on metoprolol along with diltiazem and discharged home.  I had seen her about 3 to 4 months ago, patient with an appointment to see me as over the last few days she has noticed markedly elevated blood pressure.She is accompanied by her husband, otherwise no specific complaints today.  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   Family history of breast cancer    mother   GERD (gastroesophageal reflux disease)    Hyperlipidemia    Hypertension    Lymphedema of arm 11/24/2011   right arm   MVP (mitral valve prolapse)    PONV (postoperative nausea and vomiting)    has vertigo also-would like scop patch   Syncope 2019   after colonoscopy states "dehydrated"   Thyroid disease    Vertigo    Social History   Tobacco Use   Smoking status: Never   Smokeless tobacco: Never  Substance Use Topics   Alcohol use: No    Alcohol/week: 0.0 standard drinks of alcohol   Marital Status: Married  ROS  Review of Systems  Cardiovascular:  Negative for chest pain,  dyspnea on exertion, leg swelling and palpitations.   Objective  Blood pressure (!) 147/65, pulse (!) 51, resp. rate 16, height 5\' 9"  (1.753 m), weight 175 lb 6.4 oz (79.6 kg), SpO2 99 %. Body mass index is 25.9 kg/m.     03/10/2023    2:22 PM 03/10/2023    2:18 PM 02/25/2023    8:57 AM  Vitals with BMI  Height  5\' 9"  5\' 9"   Weight  175 lbs 6 oz 183 lbs 13 oz  BMI  25.89 27.13  Systolic 147 159 409  Diastolic 65 55 66  Pulse 51 51 54     Physical Exam Neck:     Vascular: No carotid bruit or JVD.  Cardiovascular:     Rate and Rhythm: Regular rhythm. Bradycardia present.     Pulses: Intact distal pulses.     Heart sounds: Normal heart sounds. No murmur heard.    No gallop.  Pulmonary:     Effort: Pulmonary effort is normal.     Breath sounds: Normal breath sounds.  Abdominal:     General: Bowel sounds are normal.     Palpations: Abdomen is soft.  Musculoskeletal:     Right lower leg: No edema.     Left lower leg: No edema.      Laboratory  examination:   Recent Labs    05/13/22 1000  NA 142  K 4.4  CL 108  CO2 26  GLUCOSE 116*  BUN 24*  CREATININE 1.30*  CALCIUM 10.0  GFRNONAA 42*      Latest Ref Rng & Units 05/13/2022   10:00 AM 12/17/2021    1:17 PM 05/13/2021    9:18 AM  CMP  Glucose 70 - 99 mg/dL 409  89  811   BUN 8 - 23 mg/dL 24  24  22    Creatinine 0.44 - 1.00 mg/dL 9.14  7.82  9.56   Sodium 135 - 145 mmol/L 142  146  141   Potassium 3.5 - 5.1 mmol/L 4.4  4.9  4.9   Chloride 98 - 111 mmol/L 108  107  105   CO2 22 - 32 mmol/L 26  17  27    Calcium 8.9 - 10.3 mg/dL 21.3  08.6  57.8   Total Protein 6.5 - 8.1 g/dL 7.1   7.0   Total Bilirubin 0.3 - 1.2 mg/dL 1.2   1.2   Alkaline Phos 38 - 126 U/L 39   47   AST 15 - 41 U/L 14   16   ALT 0 - 44 U/L 7   10       Latest Ref Rng & Units 05/13/2022   10:00 AM 05/13/2021    9:18 AM 05/10/2020    7:47 AM  CBC  WBC 4.0 - 10.5 K/uL 5.8  5.9  6.7   Hemoglobin 12.0 - 15.0 g/dL 46.9  62.9  52.8   Hematocrit  36.0 - 46.0 % 37.6  40.3  41.7   Platelets 150 - 400 K/uL 168  160  144     External labs:   Labs 02/16/2023:  Hb 11.4/HCT 36.5, platelets: 55, normal indicis.  BUN 19, creatinine 1.31, EGFR 38 mL, potassium 4.7, LFTs normal.  Total cholesterol 1 and 53, triglycerides 11/17/2012, HDL 51, LDL 79.  A1c 5.9%.  TSH normal at 1.80.  Labs 05/19/2022:  A1c 5.9%.  Vitamin D 31.9.  BUN 21, creatinine 1.37, EGFR 36,, potassium 4.3, LFTs normal.  Hb 12.3/HCT 38.4, platelets 152, normal indicis. Radiology:   CT of the chest 04/28/2016: 1. No acute cardiopulmonary abnormalities. 2. Aortic atherosclerosis and multi vessel coronary artery calcification. 3. 2 mm nodule in the anterior right upper lobe is stable from previous exam and is compatible with a benign etiology.  OTHER:   Upper GI endoscopy 08/20/2021: Reflux esophagitis with no bleeding.  3 cm hiatal hernia.  Colonoscopy performed on 05/25/2019 revealing 2 1 to 2 mm polyps and mild diverticulosis and nonbleeding internal hemorrhoids.  Cardiac Studies:  [facility], [place], [place]  Echocardiogram 07/16/2021: Normal LV size and normal LV systolic function, EF 70%.  Mild LVH. Normal RV systolic function and normal size. Trivial pericardial effusion.  EKG:   EKG 10/25/2023: Marked sinus bradycardia at the rate of 44 bpm, incomplete right bundle branch block.  Otherwise normal.  Compared to 06/27/2022, no change.  EKG 08/15/2021: Atrial fibrillation with rapid ventricular response at rate of 159 bpm.  Poor R wave progression.  Low-voltage complexes.  Nonspecific T abnormality.   Medications and allergies   Allergies  Allergen Reactions   Codeine Sulfate Swelling   Fish-Derived Products Other (See Comments)    Cellulitis    Rocephin [Ceftriaxone Sodium] Other (See Comments)    Low blood count   Sulfa Antibiotics Other (See Comments)    Mouth and  lip sores    Current Outpatient Medications:    ALPRAZolam (XANAX) 0.5 MG  tablet, Take 0.5 mg by mouth daily as needed for anxiety. , Disp: , Rfl:    amLODipine (NORVASC) 10 MG tablet, Take 1 tablet (10 mg total) by mouth daily., Disp: 90 tablet, Rfl: 3   apixaban (ELIQUIS) 5 MG TABS tablet, Take 1 tablet (5 mg total) by mouth 2 (two) times daily., Disp: 200 tablet, Rfl: 3   fluticasone (FLONASE) 50 MCG/ACT nasal spray, Place 1-2 sprays into both nostrils daily as needed for allergies or rhinitis., Disp: , Rfl:    hydrALAZINE (APRESOLINE) 50 MG tablet, Take 1 tablet (50 mg total) by mouth as directed. 1 tablet twice daily, take additional 1 to 2 tablets every 6 hours if SBP >150 mmHg., Disp: 270 tablet, Rfl: 3   losartan (COZAAR) 100 MG tablet, Take 100 mg by mouth daily. , Disp: , Rfl:    metoprolol succinate (TOPROL-XL) 25 MG 24 hr tablet, Take 1 tablet (25 mg total) by mouth daily. Take with or immediately following a meal., Disp: 90 tablet, Rfl: 3   omeprazole (PRILOSEC) 40 MG capsule, Take 40 mg by mouth as needed., Disp: , Rfl:    Probiotic Product (ALIGN PO), Take by mouth., Disp: , Rfl:    rosuvastatin (CRESTOR) 5 MG tablet, Take 5 mg by mouth daily., Disp: , Rfl: 0   spironolactone (ALDACTONE) 25 MG tablet, TAKE 1 TABLET BY MOUTH EVERY DAY IN THE MORNING, Disp: 90 tablet, Rfl: 0   Assessment     ICD-10-CM   1. Primary hypertension  I10 amLODipine (NORVASC) 10 MG tablet    hydrALAZINE (APRESOLINE) 50 MG tablet    2. 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  Yearly risk of stroke: 9.8% (A, F, HTN, Pre DM, Coronary atherosclerosis).  Score of 1=0.6; 2=2.2; 3=3.2; 4=4.8; 5=7.2; 6=9.8; 7=>9.8) -(CHF; HTN; vasc disease DM,  Female = 1; Age <65 =0; 65-74 = 1,  >75 =2; stroke/embolism= 2).    Medications Discontinued During This Encounter  Medication Reason   amLODipine (NORVASC) 5 MG tablet Dose change    Meds ordered this encounter  Medications   amLODipine (NORVASC) 10 MG tablet    Sig: Take 1 tablet (10 mg total) by mouth  daily.    Dispense:  90 tablet    Refill:  3   hydrALAZINE (APRESOLINE) 50 MG tablet    Sig: Take 1 tablet (50 mg total) by mouth as directed. 1 tablet twice daily, take additional 1 to 2 tablets every 6 hours if SBP >150 mmHg.    Dispense:  270 tablet    Refill:  3   No orders of the defined types were placed in this encounter.  Recommendations:   Jennifer Yang is a 82 y.o. Caucasian female patient with hypertension, hyperlipidemia, prediabetes mellitus, had a accidental fall and had left femoral fracture in the last week of July 2022 and admitted to [facility] and had new onset A. fib with RVR, spontaneously converted back to sinus rhythm but had a second episode and hence on chronic anticoagulation.  1. Primary hypertension I had seen her about 3 to 4 months ago, patient with an appointment to see me as over the last few days she has noticed markedly elevated blood pressure.  I reviewed her blood pressure recordings from home, they have ranged anywhere from 150 to 170 mmHg.  Blood pressure was  also elevated in office today.  Will increase her amlodipine from 5 mg to 10 mg daily, will also start her on hydralazine 50 mg twice daily plus as needed use for SBP >150 mmHg.  Hopefully if hydralazine works and increasing the amlodipine to 10 mg improves her blood pressure control, we could certainly try to reduce the dose of the spironolactone to 12.5 mg daily in view of chronic stage IIIb kidney disease.  There is no clinical evidence of heart failure, otherwise stable.  - amLODipine (NORVASC) 10 MG tablet; Take 1 tablet (10 mg total) by mouth daily.  Dispense: 90 tablet; Refill: 3 - hydrALAZINE (APRESOLINE) 50 MG tablet; Take 1 tablet (50 mg total) by mouth as directed. 1 tablet twice daily, take additional 1 to 2 tablets every 6 hours if SBP >150 mmHg.  Dispense: 270 tablet; Refill: 3  2. Stage 3b chronic kidney disease I reviewed external labs, stage IIIb chronic kidney disease is  remained stable.  She is presently on losartan 100 mg daily and also on spironolactone and renal function has remained stable fortunately.  I would like to see her back in 6 to 8 weeks for follow-up of hypertension specifically.   Yates Decamp, MD, [facility] 03/10/2023, 2:47 PM Office: [phone]

## 2023-03-12 ENCOUNTER — Other Ambulatory Visit: Payer: Self-pay | Admitting: Cardiology

## 2023-03-12 DIAGNOSIS — I10 Essential (primary) hypertension: Secondary | ICD-10-CM

## 2023-03-26 ENCOUNTER — Encounter: Payer: Self-pay | Admitting: Podiatry

## 2023-03-26 ENCOUNTER — Ambulatory Visit (INDEPENDENT_AMBULATORY_CARE_PROVIDER_SITE_OTHER): Payer: Medicare Other | Admitting: Podiatry

## 2023-03-26 ENCOUNTER — Encounter: Payer: Self-pay | Admitting: Hematology & Oncology

## 2023-03-26 DIAGNOSIS — M79674 Pain in right toe(s): Secondary | ICD-10-CM

## 2023-03-26 DIAGNOSIS — M79675 Pain in left toe(s): Secondary | ICD-10-CM

## 2023-03-26 DIAGNOSIS — D689 Coagulation defect, unspecified: Secondary | ICD-10-CM | POA: Diagnosis not present

## 2023-03-26 DIAGNOSIS — B351 Tinea unguium: Secondary | ICD-10-CM | POA: Diagnosis not present

## 2023-03-26 DIAGNOSIS — L84 Corns and callosities: Secondary | ICD-10-CM | POA: Diagnosis not present

## 2023-03-26 NOTE — Progress Notes (Signed)
This patient returns to my office for at risk foot care.  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  This patient is unable to cut nails herself since the patient cannot reach her nails.These nails are painful walking and wearing shoes.  She says the callus on her feet are painful walking.This patient presents for at risk foot care today.  General Appearance  Alert, conversant and in no acute stress.  Vascular  Dorsalis pedis and posterior tibial  pulses are  weakly palpable  bilaterally.  Capillary return is within normal limits  bilaterally. Temperature is within normal limits  bilaterally.  Neurologic  Senn-Weinstein monofilament wire test within normal limits  bilaterally. Muscle power within normal limits bilaterally.  Nails Thick disfigured discolored nails with subungual debris  hallux nails bilaterally. No evidence of bacterial infection or drainage bilaterally.  Orthopedic  No limitations of motion  feet .  No crepitus or effusions noted.  HAV  B/L.  Contracted digits  B/L.  Plantar flexed fifth metatarsal  B/L.  Cavus foot type.  Skin  normotropic skin with no porokeratosis noted bilaterally.  No signs of infections or ulcers noted.   Callus sub 5th  B/L and callus sub 2 right foot.  Onychomycosis  Pain in right toes  Pain in left toes  Callus  B/L.  Consent was obtained for treatment procedures.   Mechanical debridement of nails 1-5  bilaterally performed with a nail nipper.  Filed with dremel without incident. Padding dispensed for left great toe.   Return office visit    3 months                  Told patient to return for periodic foot care and evaluation due to potential at risk complications.   Helane Gunther DPM

## 2023-04-21 ENCOUNTER — Encounter: Payer: Self-pay | Admitting: Cardiology

## 2023-04-21 ENCOUNTER — Ambulatory Visit: Payer: Medicare Other | Admitting: Cardiology

## 2023-04-21 VITALS — BP 123/58 | HR 49 | Ht 69.0 in | Wt 177.0 lb

## 2023-04-21 DIAGNOSIS — I10 Essential (primary) hypertension: Secondary | ICD-10-CM | POA: Diagnosis not present

## 2023-04-21 DIAGNOSIS — I480 Paroxysmal atrial fibrillation: Secondary | ICD-10-CM

## 2023-04-21 DIAGNOSIS — N1832 Chronic kidney disease, stage 3b: Secondary | ICD-10-CM

## 2023-04-21 MED ORDER — HYDRALAZINE HCL 50 MG PO TABS
25.0000 mg | ORAL_TABLET | ORAL | 3 refills | Status: DC
Start: 2023-04-21 — End: 2024-01-21

## 2023-04-21 NOTE — Progress Notes (Signed)
Primary Physician/Referring:  Merri Brunette, MD  Patient ID: Jennifer Yang, female    DOB: 1941/02/08, 82 y.o.   MRN: [id number]  Chief Complaint  Patient presents with   Hypertension   Follow-up    6 weeks   HPI:    [name]  is a 82 y.o. Caucasian female patient with hypertension, hyperlipidemia, prediabetes mellitus, had a accidental fall and had left femoral fracture in the last week of July 2022 and was admitted to [facility] and had new onset A. fib with RVR, spontaneously converted back to sinus rhythm but had a second episode, was started on metoprolol along with diltiazem and discharged home.  She was seen by me 6 weeks ago and had started her on hydralazine regular basis and I have continued spironolactone.  She is on 4 antihypertensive medications.  Patient discontinued hydralazine as her leg started to swell.  Patient states that she has been maintaining a log of her blood pressure, fortunately blood pressure is improved and well-controlled now.  She also states that she is under extreme stress from issues with taxes.  She is accompanied by her husband, otherwise no specific complaints today.  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   Family history of breast cancer    mother   GERD (gastroesophageal reflux disease)    Hyperlipidemia    Hypertension    Lymphedema of arm 11/24/2011   right arm   MVP (mitral valve prolapse)    PONV (postoperative nausea and vomiting)    has vertigo also-would like scop patch   Syncope 2019   after colonoscopy states  "dehydrated"   Thyroid disease    Vertigo    Social History   Tobacco Use   Smoking status: Never   Smokeless tobacco: Never  Substance Use Topics   Alcohol use: No    Alcohol/week: 0.0 standard drinks of alcohol   Marital Status: Married  ROS  Review of Systems  Cardiovascular:  Negative for chest pain, dyspnea on exertion, leg swelling and palpitations.   Objective  Blood pressure (!) 123/58, pulse (!) 49, height 5\' 9"  (1.753 m), weight 177 lb (80.3 kg), SpO2 100 %. Body mass index is 26.14 kg/m.     04/21/2023   12:31 PM 03/10/2023    2:22 PM 03/10/2023    2:18 PM  Vitals with BMI  Height 5\' 9"   5\' 9"   Weight 177 lbs  175 lbs 6 oz  BMI 26.13  25.89  Systolic 123 147 846  Diastolic 58 65 55  Pulse 49 51 51     Physical Exam Neck:     Vascular: No carotid bruit or JVD.  Cardiovascular:     Rate and Rhythm: Regular rhythm. Bradycardia present.     Pulses: Intact distal pulses.     Heart sounds: Normal heart sounds. No murmur heard.    No gallop.  Pulmonary:     Effort: Pulmonary effort is normal.     Breath sounds: Normal breath  sounds.  Abdominal:     General: Bowel sounds are normal.     Palpations: Abdomen is soft.  Musculoskeletal:     Right lower leg: No edema.     Left lower leg: No edema.     Laboratory examination:   Recent Labs    05/13/22 1000  NA 142  K 4.4  CL 108  CO2 26  GLUCOSE 116*  BUN 24*  CREATININE 1.30*  CALCIUM 10.0  GFRNONAA 42*      Latest Ref Rng & Units 05/13/2022   10:00 AM 12/17/2021    1:17 PM 05/13/2021    9:18 AM  CMP  Glucose 70 - 99 mg/dL 161  89  096   BUN 8 - 23 mg/dL 24  24  22    Creatinine 0.44 - 1.00 mg/dL 0.45  4.09  8.11   Sodium 135 - 145 mmol/L 142  146  141   Potassium 3.5 - 5.1 mmol/L 4.4  4.9  4.9   Chloride 98 - 111 mmol/L 108  107  105   CO2 22 - 32 mmol/L 26  17  27    Calcium 8.9 - 10.3 mg/dL 91.4  78.2  95.6   Total Protein 6.5 - 8.1 g/dL 7.1   7.0   Total Bilirubin 0.3 - 1.2 mg/dL 1.2   1.2    Alkaline Phos 38 - 126 U/L 39   47   AST 15 - 41 U/L 14   16   ALT 0 - 44 U/L 7   10       Latest Ref Rng & Units 05/13/2022   10:00 AM 05/13/2021    9:18 AM 05/10/2020    7:47 AM  CBC  WBC 4.0 - 10.5 K/uL 5.8  5.9  6.7   Hemoglobin 12.0 - 15.0 g/dL 21.3  08.6  57.8   Hematocrit 36.0 - 46.0 % 37.6  40.3  41.7   Platelets 150 - 400 K/uL 168  160  144     External labs:   Labs 02/16/2023:  Hb 11.4/HCT 36.5, platelets: 55, normal indicis.  BUN 19, creatinine 1.31, EGFR 38 mL, potassium 4.7, LFTs normal.  Total cholesterol 1 and 53, triglycerides 11/17/2012, HDL 51, LDL 79.  A1c 5.9%.  TSH normal at 1.80.  Labs 05/19/2022:  A1c 5.9%.  Vitamin D 31.9.  BUN 21, creatinine 1.37, EGFR 36,, potassium 4.3, LFTs normal.  Hb 12.3/HCT 38.4, platelets 152, normal indicis. Radiology:   CT of the chest 04/28/2016: 1. No acute cardiopulmonary abnormalities. 2. Aortic atherosclerosis and multi vessel coronary artery calcification. 3. 2 mm nodule in the anterior right upper lobe is stable from previous exam and is compatible with a benign etiology.  OTHER:   Upper GI endoscopy 08/20/2021: Reflux esophagitis with no bleeding.  3 cm hiatal hernia.  Colonoscopy performed on 05/25/2019 revealing 2 1 to 2 mm polyps and mild diverticulosis and nonbleeding internal hemorrhoids.  Cardiac Studies:  [facility], [place], [place]  Echocardiogram 07/16/2021: Normal LV size and normal LV systolic function, EF 70%.  Mild LVH. Normal RV systolic function and normal size. Trivial pericardial effusion.  EKG:   EKG 10/25/2023: Marked sinus bradycardia at the rate of 44 bpm, incomplete right bundle branch block.  Otherwise normal.  Compared to 06/27/2022, no change.  EKG 08/15/2021: Atrial fibrillation with rapid ventricular response at rate of 159 bpm.  Poor R wave progression.  Low-voltage complexes.  Nonspecific T abnormality.   Medications and allergies   Allergies  Allergen Reactions   Codeine  Sulfate Swelling   Fish-Derived Products Other (See Comments)    Cellulitis    Rocephin [Ceftriaxone Sodium] Other (See Comments)    Low blood count   Sulfa Antibiotics Other (See Comments)    Mouth and lip sores    Current Outpatient Medications:    ALPRAZolam (XANAX) 0.5 MG tablet, Take 0.5 mg by mouth daily as needed for anxiety. , Disp: , Rfl:    amLODipine (NORVASC) 10 MG tablet, Take 1 tablet (10 mg total) by mouth daily., Disp: 90 tablet, Rfl: 3   apixaban (ELIQUIS) 5 MG TABS tablet, Take 1 tablet (5 mg total) by mouth 2 (two) times daily., Disp: 200 tablet, Rfl: 3   Cholecalciferol (D3 2000) 50 MCG (2000 UT) CAPS, Take 4,000 Units by mouth daily., Disp: , Rfl:    fluticasone (FLONASE) 50 MCG/ACT nasal spray, Place 1-2 sprays into both nostrils daily as needed for allergies or rhinitis., Disp: , Rfl:    losartan (COZAAR) 100 MG tablet, Take 100 mg by mouth daily. , Disp: , Rfl:    metoprolol succinate (TOPROL-XL) 25 MG 24 hr tablet, Take 1 tablet (25 mg total) by mouth daily. Take with or immediately following a meal., Disp: 90 tablet, Rfl: 3   omeprazole (PRILOSEC) 40 MG capsule, Take 40 mg by mouth as needed., Disp: , Rfl:    Probiotic Product (ALIGN PO), Take by mouth., Disp: , Rfl:    rosuvastatin (CRESTOR) 5 MG tablet, Take 5 mg by mouth daily., Disp: , Rfl: 0   spironolactone (ALDACTONE) 25 MG tablet, TAKE 1 TABLET BY MOUTH EVERY DAY IN THE MORNING, Disp: 90 tablet, Rfl: 0   hydrALAZINE (APRESOLINE) 50 MG tablet, Take 0.5 tablets (25 mg total) by mouth as directed. 1/2 tablet every 6 hours for SBP >140 mmHg., Disp: 270 tablet, Rfl: 3   Assessment     ICD-10-CM   1. Primary hypertension  I10 hydrALAZINE (APRESOLINE) 50 MG tablet    2. Stage 3b chronic kidney disease (HCC)  N18.32     3. Paroxysmal atrial fibrillation (HCC)  I48.0       CHA2DS2-VASc Score is 6.  Yearly risk of stroke: 9.8% (A, F, HTN, Pre DM, Coronary atherosclerosis).  Score of 1=0.6; 2=2.2; 3=3.2; 4=4.8;  5=7.2; 6=9.8; 7=>9.8) -(CHF; HTN; vasc disease DM,  Female = 1; Age <65 =0; 65-74 = 1,  >75 =2; stroke/embolism= 2).    Medications Discontinued During This Encounter  Medication Reason   hydrALAZINE (APRESOLINE) 50 MG tablet Reorder     Meds ordered this encounter  Medications   hydrALAZINE (APRESOLINE) 50 MG tablet    Sig: Take 0.5 tablets (25 mg total) by mouth as directed. 1/2 tablet every 6 hours for SBP >140 mmHg.    Dispense:  270 tablet    Refill:  3   No orders of the defined types were placed in this encounter.  Recommendations:   GRACELY KUBINA is a 82 y.o. Caucasian female patient with hypertension, hyperlipidemia, prediabetes mellitus, had a accidental fall and had left femoral fracture in the last week of July 2022 and admitted to [facility] and had new onset A. fib with RVR, spontaneously converted back to sinus rhythm but had a second episode and hence on chronic anticoagulation.  1. Primary hypertension I started on hydralazine for elevated blood pressure, however she developed leg edema and is discontinued hydralazine.  I changed hydralazine from 50 mg twice daily plus as needed to taking it  to 25 mg as needed for SBP >140 mmHg.  Fortunately since addition of spironolactone, blood pressure is well-controlled although renal function is slightly deteriorated.  Overall stable status.  Hence continue present medical therapy  Patient is under tremendous stress due to issues with her taxes.  - hydrALAZINE (APRESOLINE) 50 MG tablet; Take 0.5 tablets (25 mg total) by mouth as directed. 1/2 tablet every 6 hours for SBP >140 mmHg.  Dispense: 270 tablet; Refill: 3  2. Stage 3b chronic kidney disease (HCC) Stage IIIa-B chronic kidney disease has remained stable.  3. Paroxysmal atrial fibrillation [facility]) Patient presently on anticoagulation for PAF, continue the same.  She is on a low-dose beta-blocker, she is markedly bradycardic and remains asymptomatic.  Both episodes  of atrial fibrillation was brief, if she has no recurrence, could consider discontinuing anticoagulation however she is extremely high CHA2DS2-VASc rescore, hence prefer continuing anticoagulation long-term.  Continue beta-blocker for now, we will see her back in 6 months.  Patient is aware of sinus node dysfunction, dizziness and syncope to be reported.  Other orders - Cholecalciferol (D3 2000) 50 MCG (2000 UT) CAPS; Take 4,000 Units by mouth daily.   Yates Decamp, MD, [facility] 04/21/2023, 1:21 PM Office: [phone]

## 2023-05-04 NOTE — Telephone Encounter (Signed)
VOB initiated for Zilretta for LEFT knee OA 

## 2023-05-05 NOTE — Telephone Encounter (Signed)
No due until on or after 05/21/23.

## 2023-05-05 NOTE — Telephone Encounter (Signed)
Primary Insurance: Medicare     Secondary Insurance: Community education officer

## 2023-05-05 NOTE — Telephone Encounter (Signed)
Zilretta for LEFT knee OA  Primary Insurance: Medicare Co-pay: n/a Co-insurance: 20% Deductible: $240 of $240 met Prior Auth: NOT required  Secondary Insurance: [employer] Supp Co-pay: n/a Co-insurance: covers 20% medicare co-insurance Deductible: n/a Prior Auth: NOT required   Last Zilretta inj 02/25/23 Can consider repeat on or after 05/21/23

## 2023-05-12 ENCOUNTER — Inpatient Hospital Stay: Payer: Medicare Other

## 2023-05-12 ENCOUNTER — Encounter: Payer: Self-pay | Admitting: Family

## 2023-05-12 ENCOUNTER — Other Ambulatory Visit: Payer: Self-pay | Admitting: Family

## 2023-05-12 ENCOUNTER — Inpatient Hospital Stay: Payer: Medicare Other | Attending: Hematology & Oncology

## 2023-05-12 ENCOUNTER — Inpatient Hospital Stay (HOSPITAL_BASED_OUTPATIENT_CLINIC_OR_DEPARTMENT_OTHER): Payer: Medicare Other | Admitting: Family

## 2023-05-12 VITALS — BP 141/56 | HR 55 | Temp 97.2°F | Resp 18 | Wt 179.0 lb

## 2023-05-12 DIAGNOSIS — E559 Vitamin D deficiency, unspecified: Secondary | ICD-10-CM

## 2023-05-12 DIAGNOSIS — C50911 Malignant neoplasm of unspecified site of right female breast: Secondary | ICD-10-CM

## 2023-05-12 DIAGNOSIS — T50905A Adverse effect of unspecified drugs, medicaments and biological substances, initial encounter: Secondary | ICD-10-CM

## 2023-05-12 DIAGNOSIS — Z7983 Long term (current) use of bisphosphonates: Secondary | ICD-10-CM | POA: Insufficient documentation

## 2023-05-12 DIAGNOSIS — I890 Lymphedema, not elsewhere classified: Secondary | ICD-10-CM

## 2023-05-12 DIAGNOSIS — C50011 Malignant neoplasm of nipple and areola, right female breast: Secondary | ICD-10-CM | POA: Diagnosis not present

## 2023-05-12 DIAGNOSIS — Z170 Estrogen receptor positive status [ER+]: Secondary | ICD-10-CM | POA: Diagnosis not present

## 2023-05-12 LAB — CMP (CANCER CENTER ONLY)
ALT: 8 U/L (ref 0–44)
AST: 15 U/L (ref 15–41)
Albumin: 4.4 g/dL (ref 3.5–5.0)
Alkaline Phosphatase: 38 U/L (ref 38–126)
Anion gap: 7 (ref 5–15)
BUN: 24 mg/dL — ABNORMAL HIGH (ref 8–23)
CO2: 26 mmol/L (ref 22–32)
Calcium: 10.3 mg/dL (ref 8.9–10.3)
Chloride: 107 mmol/L (ref 98–111)
Creatinine: 1.56 mg/dL — ABNORMAL HIGH (ref 0.44–1.00)
GFR, Estimated: 33 mL/min — ABNORMAL LOW (ref >60)
Glucose, Bld: 114 mg/dL — ABNORMAL HIGH (ref 70–99)
Potassium: 4.9 mmol/L (ref 3.5–5.1)
Sodium: 140 mmol/L (ref 135–145)
Total Bilirubin: 0.8 mg/dL (ref 0.3–1.2)
Total Protein: 6.8 g/dL (ref 6.5–8.1)

## 2023-05-12 LAB — CBC WITH DIFFERENTIAL (CANCER CENTER ONLY)
Abs Immature Granulocytes: 0.02 10*3/uL (ref 0.00–0.07)
Basophils Absolute: 0.1 10*3/uL (ref 0.0–0.1)
Basophils Relative: 1 %
Eosinophils Absolute: 0.1 10*3/uL (ref 0.0–0.5)
Eosinophils Relative: 2 %
HCT: 38.1 % (ref 36.0–46.0)
Hemoglobin: 12.1 g/dL (ref 12.0–15.0)
Immature Granulocytes: 0 %
Lymphocytes Relative: 28 %
Lymphs Abs: 1.6 10*3/uL (ref 0.7–4.0)
MCH: 30.3 pg (ref 26.0–34.0)
MCHC: 31.8 g/dL (ref 30.0–36.0)
MCV: 95.5 fL (ref 80.0–100.0)
Monocytes Absolute: 0.4 10*3/uL (ref 0.1–1.0)
Monocytes Relative: 7 %
Neutro Abs: 3.5 10*3/uL (ref 1.7–7.7)
Neutrophils Relative %: 62 %
Platelet Count: 161 10*3/uL (ref 150–400)
RBC: 3.99 MIL/uL (ref 3.87–5.11)
RDW: 12.6 % (ref 11.5–15.5)
WBC Count: 5.7 10*3/uL (ref 4.0–10.5)
nRBC: 0 % (ref 0.0–0.2)

## 2023-05-12 MED ORDER — SODIUM CHLORIDE 0.9 % IV SOLN: Freq: Once | INTRAVENOUS | Status: AC

## 2023-05-12 MED ORDER — AZITHROMYCIN 250 MG PO TABS
ORAL_TABLET | ORAL | 0 refills | Status: DC
Start: 2023-05-12 — End: 2023-09-21

## 2023-05-12 MED ORDER — ZOLEDRONIC ACID 4 MG/100ML IV SOLN
4.0000 mg | Freq: Once | INTRAVENOUS | Status: AC
  Administered 2023-05-12: 4 mg via INTRAVENOUS
  Filled 2023-05-12: qty 100

## 2023-05-12 NOTE — Progress Notes (Addendum)
Hematology and Oncology Follow Up Visit  Jennifer Yang [id number] [birth year] 82 y.o. 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 She is doing well and has no complaints at this time.  She had her mammogram in April and results were negative! No adenopathy or lymphedema noted on today's exam.  No fever, chills, n/v, cough, rash, dizziness, SOB, chest pain, palpitations, abdominal pain or changes in bowel or bladder habits.  She has started taking a probiotic several days a week and this seems to have helped her past GI issues.  No blood loss, bruising or petechiae.  No swelling, tenderness, numbness or tingling in her extremities.  No falls or syncope.  Appetite and hydration are good. Weight is stable at 179 lbs.   ECOG Performance Status: 0 - Asymptomatic  Medications:  Allergies as of 05/12/2023       Reactions   Codeine Sulfate Swelling   Fish-derived Products Other (See Comments)   Cellulitis    Rocephin [ceftriaxone Sodium] Other (See Comments)   Low blood count   Sulfa Antibiotics Other (See Comments)   Mouth and lip sores        Medication List        Accurate as of May 12, 2023  9:49 AM. If you have any questions, ask your nurse or doctor.          ALIGN PO Take by mouth.   ALPRAZolam 0.5 MG tablet Commonly known as: XANAX Take 0.5 mg by mouth daily as needed for anxiety.   amLODipine 10 MG tablet Commonly known as: NORVASC Take 1 tablet (10 mg total) by mouth daily.   apixaban 5 MG Tabs tablet Commonly known as: Eliquis Take 1 tablet (5 mg total) by mouth 2 (two) times daily.   D3 2000 50 MCG (2000 UT) Caps Generic drug: Cholecalciferol Take 4,000 Units by mouth daily.    fluticasone 50 MCG/ACT nasal spray Commonly known as: FLONASE Place 1-2 sprays into both nostrils daily as needed for allergies or rhinitis.   hydrALAZINE 50 MG tablet Commonly known as: APRESOLINE Take 0.5 tablets (25 mg total) by mouth as directed. 1/2 tablet every 6 hours for SBP >140 mmHg.   losartan 100 MG tablet Commonly known as: COZAAR Take 100 mg by mouth daily.   metoprolol succinate 25 MG 24 hr tablet Commonly known as: TOPROL-XL Take 1 tablet (25 mg total) by mouth daily. Take with or immediately following a meal.   omeprazole 40 MG capsule Commonly known as: PRILOSEC Take 40 mg by mouth as needed.   rosuvastatin 5 MG tablet Commonly known as: CRESTOR Take 5 mg by mouth daily.   spironolactone 25 MG tablet Commonly known as: ALDACTONE TAKE 1 TABLET BY MOUTH EVERY DAY IN THE MORNING        Allergies:  Allergies  Allergen Reactions   Codeine Sulfate Swelling   Fish-Derived Products Other (See Comments)    Cellulitis    Rocephin [Ceftriaxone Sodium] Other (See Comments)    Low blood count   Sulfa Antibiotics Other (See Comments)    Mouth and lip sores    Past Medical History, Surgical history, Social history,  and Family History were reviewed and updated.  Review of Systems: All other 10 point review of systems is negative.   Physical Exam:  weight is 179 lb (81.2 kg). Her oral temperature is 97.2 F (36.2 C) (abnormal). Her blood pressure is 141/56 (abnormal) and her pulse is 55 (abnormal). Her respiration is 18 and oxygen saturation is 100%.   Wt Readings from Last 3 Encounters:  05/12/23 179 lb (81.2 kg)  04/21/23 177 lb (80.3 kg)  03/10/23 175 lb 6.4 oz (79.6 kg)    Ocular: Sclerae unicteric, pupils equal, round and reactive to light Ear-nose-throat: Oropharynx clear, dentition fair Lymphatic: No cervical, supraclavicular or axillary adenopathy Lungs no rales or rhonchi, good excursion bilaterally Heart regular rate and rhythm, no murmur  appreciated Abd soft, nontender, positive bowel sounds MSK no focal spinal tenderness, no joint edema Neuro: non-focal, well-oriented, appropriate affect Breasts: No changes per patient  Lab Results  Component Value Date   WBC 5.7 05/12/2023   HGB 12.1 05/12/2023   HCT 38.1 05/12/2023   MCV 95.5 05/12/2023   PLT 161 05/12/2023   No results found for: "FERRITIN", "IRON", "TIBC", "UIBC", "IRONPCTSAT" Lab Results  Component Value Date   RBC 3.99 05/12/2023   No results found for: "KPAFRELGTCHN", "LAMBDASER", "KAPLAMBRATIO" No results found for: "IGGSERUM", "IGA", "IGMSERUM" No results found for: "TOTALPROTELP", "ALBUMINELP", "A1GS", "A2GS", "BETS", "BETA2SER", "GAMS", "MSPIKE", "SPEI"   Chemistry      Component Value Date/Time   NA 142 05/13/2022 1000   NA 146 (H) 12/17/2021 1317   NA 146 (H) 11/09/2017 1135   NA 144 05/24/2013 0959   K 4.4 05/13/2022 1000   K 3.9 11/09/2017 1135   K 3.9 05/24/2013 0959   CL 108 05/13/2022 1000   CL 104 11/09/2017 1135   CO2 26 05/13/2022 1000   CO2 28 11/09/2017 1135   CO2 27 05/24/2013 0959   BUN 24 (H) 05/13/2022 1000   BUN 24 12/17/2021 1317   BUN 23 (H) 11/09/2017 1135   BUN 19.7 05/24/2013 0959   CREATININE 1.30 (H) 05/13/2022 1000   CREATININE 1.1 11/09/2017 1135   CREATININE 1.1 05/24/2013 0959      Component Value Date/Time   CALCIUM 10.0 05/13/2022 1000   CALCIUM 9.4 11/09/2017 1135   CALCIUM 9.8 05/24/2013 0959   ALKPHOS 39 05/13/2022 1000   ALKPHOS 57 11/09/2017 1135   AST 14 (L) 05/13/2022 1000   ALT 7 05/13/2022 1000   ALT 23 11/09/2017 1135   BILITOT 1.2 05/13/2022 1000       Impression and Plan: Jennifer Yang is a very pleasant 82 yo caucasian female with history of 2 separate primary breast cancers, both stage 1 and ER positive. She is doing well and so far there has been no evidence of recurrence.  Zometa given today as planned.  Z pack refilled for PRN irritation with lymphedema.  Follow-up in 1 year.    Jennifer Stanford, NP 6/25/20249:49 AM

## 2023-05-12 NOTE — Patient Instructions (Signed)

## 2023-05-13 LAB — VITAMIN D 25 HYDROXY (VIT D DEFICIENCY, FRACTURES): Vit D, 25-Hydroxy: 46.78 ng/mL (ref 30–100)

## 2023-05-18 DIAGNOSIS — N760 Acute vaginitis: Secondary | ICD-10-CM | POA: Diagnosis not present

## 2023-05-18 DIAGNOSIS — R300 Dysuria: Secondary | ICD-10-CM | POA: Diagnosis not present

## 2023-06-01 ENCOUNTER — Other Ambulatory Visit: Payer: Self-pay | Admitting: Cardiology

## 2023-06-01 DIAGNOSIS — I480 Paroxysmal atrial fibrillation: Secondary | ICD-10-CM

## 2023-06-01 DIAGNOSIS — I10 Essential (primary) hypertension: Secondary | ICD-10-CM

## 2023-06-16 DIAGNOSIS — L570 Actinic keratosis: Secondary | ICD-10-CM | POA: Diagnosis not present

## 2023-06-18 ENCOUNTER — Ambulatory Visit (INDEPENDENT_AMBULATORY_CARE_PROVIDER_SITE_OTHER): Payer: Medicare Other | Admitting: Family Medicine

## 2023-06-18 ENCOUNTER — Encounter: Payer: Self-pay | Admitting: Family Medicine

## 2023-06-18 ENCOUNTER — Other Ambulatory Visit: Payer: Self-pay

## 2023-06-18 VITALS — BP 120/62 | HR 59 | Ht 69.0 in | Wt 181.4 lb

## 2023-06-18 DIAGNOSIS — G8929 Other chronic pain: Secondary | ICD-10-CM

## 2023-06-18 DIAGNOSIS — M25562 Pain in left knee: Secondary | ICD-10-CM

## 2023-06-18 DIAGNOSIS — M7918 Myalgia, other site: Secondary | ICD-10-CM

## 2023-06-18 DIAGNOSIS — M1712 Unilateral primary osteoarthritis, left knee: Secondary | ICD-10-CM

## 2023-06-18 MED ORDER — TRIAMCINOLONE ACETONIDE 32 MG IX SRER
32.0000 mg | Freq: Once | INTRA_ARTICULAR | Status: AC
Start: 2023-06-18 — End: 2023-06-18
  Administered 2023-06-18: 32 mg via INTRA_ARTICULAR

## 2023-06-18 NOTE — Patient Instructions (Addendum)
Thank you for coming in today.   You received an injection today. Seek immediate medical attention if the joint becomes red, extremely painful, or is oozing fluid.   Let me know if we need to do PT for that back.

## 2023-06-18 NOTE — Progress Notes (Signed)
   I, Stevenson Clinch, CMA acting as a scribe for Clementeen Graham, MD.  Jennifer Yang is a 82 y.o. female who presents to [facility] at [facility] today for cont'd L knee pain. Pt was last seen by Dr. Denyse Amass on 02/25/23 and was given a repeat L knee Zilretta injection.  Today, pt reports worsening pain over the past 2-3 weeks. Denies swelling, mechanical sx, radicular sx. Sx have responded well to Zilretta in the past. Takes Tylenol prn for breakthrough pain.   She notes some pain in her right rhomboid region.  This occurs chronically especially with prolonged standing and working for example cooking and doing the dishes.  Dx imaging: 06/04/22 L knee XR (done at [facility])   Pertinent review of systems: No fevers or chills  Relevant historical information: History of breast cancer   Exam:  BP 120/62   Pulse (!) 59   Ht 5\' 9"  (1.753 m)   Wt 181 lb 6.4 oz (82.3 kg)   SpO2 98%   BMI 26.79 kg/m  General: Well Developed, well nourished, and in no acute distress.   MSK: T-spine nontender midline.  Tender palpation paraspinal musculature.  Left knee normal-appearing Normal motion.  Lab and Radiology Results   Zilretta injection left knee Procedure: Real-time Ultrasound Guided Injection of left knee joint superior lateral patellar space Device: Philips Affiniti 50G Images permanently stored and available for review in PACS Verbal informed consent obtained.  Discussed risks and benefits of procedure. Warned about infection, hyperglycemia bleeding, damage to structures among others. Patient expresses understanding and agreement Time-out conducted.   Noted no overlying erythema, induration, or other signs of local infection.   Skin prepped in a sterile fashion.   Local anesthesia: Topical Ethyl chloride.   With sterile technique and under real time ultrasound guidance: Zilretta 32 mg injected into knee joint. Fluid seen entering the joint capsule.   Completed  without difficulty   Advised to call if fevers/chills, erythema, induration, drainage, or persistent bleeding.   Images permanently stored and available for review in the ultrasound unit.  Impression: Technically successful ultrasound guided injection.  Lot number: [id number]     Assessment and Plan: 82 y.o. female with left knee pain.  Chronic pain with acute exacerbation due to DJD.  Plan for Zilretta injection today.  We talked about her upper back pain thought to be rhomboid muscle dysfunction.  Home exercise program reviewed.  Consider formal physical therapy referral.   PDMP not reviewed this encounter. Orders Placed This Encounter  Procedures   [place] LIMITED JOINT SPACE STRUCTURES LOW LEFT(NO LINKED CHARGES)    Order Specific Question:   Reason for Exam (SYMPTOM  OR DIAGNOSIS REQUIRED)    Answer:   left knee pain    Order Specific Question:   Preferred imaging location?    Answer:   Adult nurse [facility] ordered this encounter  Medications   Triamcinolone Acetonide (ZILRETTA) intra-articular injection 32 mg     Discussed warning signs or symptoms. Please see discharge instructions. Patient expresses understanding.   The above documentation has been reviewed and is accurate and complete Clementeen Graham, M.D.

## 2023-06-23 NOTE — Telephone Encounter (Signed)
Pt received Zilretta inj for LEFT knee OA on 06/18/23.   Can consider repeat Zilretta inj on or after 09/11/23.

## 2023-06-26 ENCOUNTER — Ambulatory Visit: Payer: Medicare Other | Admitting: Podiatry

## 2023-07-13 ENCOUNTER — Ambulatory Visit: Payer: Medicare Other | Admitting: Podiatry

## 2023-07-13 ENCOUNTER — Encounter: Payer: Self-pay | Admitting: Podiatry

## 2023-07-13 DIAGNOSIS — L84 Corns and callosities: Secondary | ICD-10-CM | POA: Diagnosis not present

## 2023-07-13 DIAGNOSIS — D689 Coagulation defect, unspecified: Secondary | ICD-10-CM

## 2023-07-13 DIAGNOSIS — M79674 Pain in right toe(s): Secondary | ICD-10-CM | POA: Diagnosis not present

## 2023-07-13 DIAGNOSIS — B351 Tinea unguium: Secondary | ICD-10-CM | POA: Diagnosis not present

## 2023-07-13 DIAGNOSIS — M79675 Pain in left toe(s): Secondary | ICD-10-CM | POA: Diagnosis not present

## 2023-07-13 NOTE — Progress Notes (Signed)
This patient returns to my office for at risk foot care.  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  This patient is unable to cut nails herself since the patient cannot reach her nails.These nails are painful walking and wearing shoes.  She says the callus on her feet are painful walking.This patient presents for at risk foot care today.  General Appearance  Alert, conversant and in no acute stress.  Vascular  Dorsalis pedis and posterior tibial  pulses are  weakly palpable  bilaterally.  Capillary return is within normal limits  bilaterally. Temperature is within normal limits  bilaterally.  Neurologic  Senn-Weinstein monofilament wire test within normal limits  bilaterally. Muscle power within normal limits bilaterally.  Nails Thick disfigured discolored nails with subungual debris  hallux nails bilaterally. No evidence of bacterial infection or drainage bilaterally.  Orthopedic  No limitations of motion  feet .  No crepitus or effusions noted.  HAV  B/L.  Contracted digits  B/L.  Plantar flexed fifth metatarsal  B/L.  Cavus foot type.  Skin  normotropic skin with no porokeratosis noted bilaterally.  No signs of infections or ulcers noted.   Callus sub 5th  B/L and callus sub 2 right foot.  Onychomycosis  Pain in right toes  Pain in left toes  Callus  B/L.  Consent was obtained for treatment procedures.   Mechanical debridement of nails 1-5  bilaterally performed with a nail nipper.  Filed with dremel without incident.  Debride callus  B/L.   Return office visit    3 months                  Told patient to return for periodic foot care and evaluation due to potential at risk complications.   Helane Gunther DPM

## 2023-08-17 DIAGNOSIS — E1121 Type 2 diabetes mellitus with diabetic nephropathy: Secondary | ICD-10-CM | POA: Diagnosis not present

## 2023-08-17 DIAGNOSIS — E7800 Pure hypercholesterolemia, unspecified: Secondary | ICD-10-CM | POA: Diagnosis not present

## 2023-08-17 DIAGNOSIS — I10 Essential (primary) hypertension: Secondary | ICD-10-CM | POA: Diagnosis not present

## 2023-08-17 LAB — LAB REPORT - SCANNED
A1c: 5.9
EGFR: 33

## 2023-08-25 DIAGNOSIS — N183 Chronic kidney disease, stage 3 unspecified: Secondary | ICD-10-CM | POA: Diagnosis not present

## 2023-08-25 DIAGNOSIS — I480 Paroxysmal atrial fibrillation: Secondary | ICD-10-CM | POA: Diagnosis not present

## 2023-08-25 DIAGNOSIS — I700 Atherosclerosis of aorta: Secondary | ICD-10-CM | POA: Diagnosis not present

## 2023-08-25 DIAGNOSIS — E1122 Type 2 diabetes mellitus with diabetic chronic kidney disease: Secondary | ICD-10-CM | POA: Diagnosis not present

## 2023-08-25 DIAGNOSIS — I4891 Unspecified atrial fibrillation: Secondary | ICD-10-CM | POA: Diagnosis not present

## 2023-08-25 DIAGNOSIS — E785 Hyperlipidemia, unspecified: Secondary | ICD-10-CM | POA: Diagnosis not present

## 2023-08-25 DIAGNOSIS — E559 Vitamin D deficiency, unspecified: Secondary | ICD-10-CM | POA: Diagnosis not present

## 2023-08-25 DIAGNOSIS — Z23 Encounter for immunization: Secondary | ICD-10-CM | POA: Diagnosis not present

## 2023-08-25 DIAGNOSIS — I10 Essential (primary) hypertension: Secondary | ICD-10-CM | POA: Diagnosis not present

## 2023-08-27 ENCOUNTER — Encounter: Payer: Self-pay | Admitting: Registered Nurse

## 2023-09-01 NOTE — Telephone Encounter (Signed)
VOB initiated for ZILRETTA for LEFT knee OA

## 2023-09-08 ENCOUNTER — Telehealth: Payer: Self-pay | Admitting: Cardiology

## 2023-09-08 DIAGNOSIS — N3020 Other chronic cystitis without hematuria: Secondary | ICD-10-CM | POA: Diagnosis not present

## 2023-09-08 DIAGNOSIS — R350 Frequency of micturition: Secondary | ICD-10-CM | POA: Diagnosis not present

## 2023-09-08 MED ORDER — AMLODIPINE BESYLATE 5 MG PO TABS: 5.0000 mg | ORAL_TABLET | Freq: Every day | ORAL | 3 refills | Status: DC

## 2023-09-08 NOTE — Telephone Encounter (Signed)
ZILRETTA for LEFT knee OA OK to schedule on or after 09/11/23  Primary Insurance: Medicare Co-Pay: n/a Co-Insurance: 20% Deductible: $240 of $240 met  Prior Authorization: NOT required  Secondary Insurance: Dispensing optician Co-Pay: n/a Co-Insurance: Covers Medicare Part B co-insurance Deductible: does NOT cover Medicare deductible  Prior Authorization: NOT required

## 2023-09-08 NOTE — Telephone Encounter (Signed)
Spoke with patient and she states she started taking her amlodipine in the morning and now has swelling in her feet and ankles. Denies any chest pain, SOB, headache, nausea or vomiting. No weight gain.   Did inform patient amlodipine can cause swelling in feet and ankles. She stated she has been on amlodipine for a while and never had this problem.  Advised to monitor salt intake, try elevating kegs and feet when she can, and compression stockings. Also to see if switch back to taking amlodipine in the morning helps. Will forward to provider

## 2023-09-08 NOTE — Telephone Encounter (Signed)
Holding until needed per patient.

## 2023-09-08 NOTE — Telephone Encounter (Signed)
NO Prior Auth required for [employer] insurance: Medicare     Secondary Insurance: [employer] Supplement

## 2023-09-08 NOTE — Telephone Encounter (Signed)
Pt is returning call.  

## 2023-09-08 NOTE — Telephone Encounter (Signed)
Agree and also  reduce the dose in half and that would probably help

## 2023-09-08 NOTE — Telephone Encounter (Signed)
Left voicemail ro return call to office

## 2023-09-08 NOTE — Telephone Encounter (Signed)
Pt c/o swelling/edema: STAT if pt has developed SOB within 24 hours  If swelling, where is the swelling located? Legs and ankles  How much weight have you gained and in what time span? NA  Have you gained 2 pounds in a day or 5 pounds in a week? No  Do you have a log of your daily weights (if so, list)? No  Are you currently taking a fluid pill? Yes  Are you currently SOB? No  Have you traveled recently in a car or plane for an extended period of time? No  Pt states that she did change the time of day that she was taking BP medication. She went from taking in the morning to taking at night, does not know if that has something to do with swelling. Please advise

## 2023-09-08 NOTE — Telephone Encounter (Signed)
Call to patient to review Dr. Verl Dicker advice to decrease amlodipine to 5 mg daily. Patient verbalizes understanding and agrees to plan, states she will continue to track BP and call our office if she notices BP trending up or identifies new symptoms.

## 2023-09-15 DIAGNOSIS — L570 Actinic keratosis: Secondary | ICD-10-CM | POA: Diagnosis not present

## 2023-09-18 NOTE — Progress Notes (Unsigned)
   Rubin Payor, PhD, LAT, ATC acting as a scribe for Clementeen Graham, MD.  Jennifer Yang is a 82 y.o. female who presents to [facility] at [facility] today for exacerbation of her L knee pain. Pt was last seen by Dr. Denyse Amass on 06/18/23 and was given a L knee Zilretta injection.  Today, pt reports L knee pain just returned recently. She is wanting a repeat today.   Dx imaging: 06/04/22 L knee XR (done at [facility])   Pertinent review of systems: No fevers or chills  Relevant historical information: Hypertension.  History of breast cancer.   Exam:  BP 136/72   Pulse (!) 50   Ht 5\' 9"  (1.753 m)   Wt 183 lb (83 kg)   SpO2 96%   BMI 27.02 kg/m  General: Well Developed, well nourished, and in no acute distress.   MSK: Left knee mild effusion mild genu valgus.  Normal motion with crepitation.    Lab and Radiology Results   Zilretta injection left knee Procedure: Real-time Ultrasound Guided Injection of left knee joint superior lateral patellar space Device: Philips Affiniti 50G Images permanently stored and available for review in PACS Verbal informed consent obtained.  Discussed risks and benefits of procedure. Warned about infection, hyperglycemia bleeding, damage to structures among others. Patient expresses understanding and agreement Time-out conducted.   Noted no overlying erythema, induration, or other signs of local infection.   Skin prepped in a sterile fashion.   Local anesthesia: Topical Ethyl chloride.   With sterile technique and under real time ultrasound guidance: Zilretta 32 mg injected into knee joint. Fluid seen entering the joint capsule.   Completed without difficulty   Advised to call if fevers/chills, erythema, induration, drainage, or persistent bleeding.   Images permanently stored and available for review in the ultrasound unit.  Impression: Technically successful ultrasound guided injection.  Lot number:  [id number]      Assessment and Plan: 82 y.o. female with left knee pain due to DJD.  Plan for repeat Zilretta injection today.  This seems to be working quite well.  If this stops working she is very reluctant to consider knee replacement.  She would be a good candidate for genicular artery embolization.  We talked about this procedure.  Otherwise anticipate recheck in 3 to 4 months when she will need another Zilretta injection.  She will let us know ahead of time.   PDMP not reviewed this encounter. Orders Placed This Encounter  Procedures   [place] LIMITED JOINT SPACE STRUCTURES LOW LEFT(NO LINKED CHARGES)    Order Specific Question:   Reason for Exam (SYMPTOM  OR DIAGNOSIS REQUIRED)    Answer:   left knee pain    Order Specific Question:   Preferred imaging location?    Answer:   Adult nurse [facility] ordered this encounter  Medications   Triamcinolone Acetonide (ZILRETTA) intra-articular injection 32 mg     Discussed warning signs or symptoms. Please see discharge instructions. Patient expresses understanding.   The above documentation has been reviewed and is accurate and complete Clementeen Graham, M.D.

## 2023-09-21 ENCOUNTER — Other Ambulatory Visit: Payer: Self-pay

## 2023-09-21 ENCOUNTER — Ambulatory Visit (INDEPENDENT_AMBULATORY_CARE_PROVIDER_SITE_OTHER): Payer: Medicare Other | Admitting: Family Medicine

## 2023-09-21 VITALS — BP 136/72 | HR 50 | Ht 69.0 in | Wt 183.0 lb

## 2023-09-21 DIAGNOSIS — G8929 Other chronic pain: Secondary | ICD-10-CM

## 2023-09-21 DIAGNOSIS — M25562 Pain in left knee: Secondary | ICD-10-CM | POA: Diagnosis not present

## 2023-09-21 DIAGNOSIS — M1712 Unilateral primary osteoarthritis, left knee: Secondary | ICD-10-CM

## 2023-09-21 MED ORDER — TRIAMCINOLONE ACETONIDE 32 MG IX SRER
32.0000 mg | Freq: Once | INTRA_ARTICULAR | Status: AC
  Administered 2023-09-21: 32 mg via INTRA_ARTICULAR

## 2023-09-21 NOTE — Patient Instructions (Signed)
Thank you for coming in today.   You received an injection today. Seek immediate medical attention if the joint becomes red, extremely painful, or is oozing fluid.  

## 2023-10-06 ENCOUNTER — Telehealth: Payer: Self-pay | Admitting: Cardiology

## 2023-10-06 NOTE — Telephone Encounter (Signed)
Pt c/o BP issue: STAT if pt c/o blurred vision, one-sided weakness or slurred speech  1. What are your last 5 BP readings? 150/73; 152/75  2. Are you having any other symptoms (ex. Dizziness, headache, blurred vision, passed out)? Dizziness, headache  3. What is your BP issue? Patient said that BP started going back up after cutting back half amlodipine

## 2023-10-06 NOTE — Telephone Encounter (Signed)
I spoke with patient.  Amlodipine was decreased to 5 mg daily on 09/08/23 due to swelling.  Patient reports swelling is better. She checked BP yesterday and it was 152/75 and then 145/70.  Since it was over 140 she took prn hydralazine. This AM her BP was 150/73 prior to taking AM medications..  After medications it was 124/61.  She takes amlodipine in the evening but all other BP medications in the morning. She has not taken hydralazine today. She tries not to salt her food but does eat a fair amount of salt daily I asked patient to check BP daily 2 hours after taking AM medications and call readings to office next Monday. Patient aware she can take prn hydralazine as needed

## 2023-10-07 DIAGNOSIS — H353131 Nonexudative age-related macular degeneration, bilateral, early dry stage: Secondary | ICD-10-CM | POA: Diagnosis not present

## 2023-10-07 DIAGNOSIS — Z961 Presence of intraocular lens: Secondary | ICD-10-CM | POA: Diagnosis not present

## 2023-10-07 DIAGNOSIS — H26491 Other secondary cataract, right eye: Secondary | ICD-10-CM | POA: Diagnosis not present

## 2023-10-12 ENCOUNTER — Encounter: Payer: Self-pay | Admitting: Podiatry

## 2023-10-12 ENCOUNTER — Ambulatory Visit (INDEPENDENT_AMBULATORY_CARE_PROVIDER_SITE_OTHER): Payer: Medicare Other | Admitting: Podiatry

## 2023-10-12 DIAGNOSIS — M79675 Pain in left toe(s): Secondary | ICD-10-CM

## 2023-10-12 DIAGNOSIS — D689 Coagulation defect, unspecified: Secondary | ICD-10-CM | POA: Diagnosis not present

## 2023-10-12 DIAGNOSIS — M79674 Pain in right toe(s): Secondary | ICD-10-CM

## 2023-10-12 DIAGNOSIS — B351 Tinea unguium: Secondary | ICD-10-CM

## 2023-10-12 DIAGNOSIS — D696 Thrombocytopenia, unspecified: Secondary | ICD-10-CM | POA: Diagnosis not present

## 2023-10-12 DIAGNOSIS — L84 Corns and callosities: Secondary | ICD-10-CM

## 2023-10-12 NOTE — Progress Notes (Signed)
This patient returns to my office for at risk foot care.  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  This patient is unable to cut nails herself since the patient cannot reach her nails.These nails are painful walking and wearing shoes.  She says the callus on her feet are painful walking.This patient presents for at risk foot care today.  General Appearance  Alert, conversant and in no acute stress.  Vascular  Dorsalis pedis and posterior tibial  pulses are  weakly palpable  bilaterally.  Capillary return is within normal limits  bilaterally. Temperature is within normal limits  bilaterally.  Neurologic  Senn-Weinstein monofilament wire test within normal limits  bilaterally. Muscle power within normal limits bilaterally.  Nails Thick disfigured discolored nails with subungual debris  hallux nails bilaterally. No evidence of bacterial infection or drainage bilaterally.  Orthopedic  No limitations of motion  feet .  No crepitus or effusions noted.  HAV  B/L.  Contracted digits  B/L.  Plantar flexed fifth metatarsal  B/L.  Cavus foot type.  Skin  normotropic skin with no porokeratosis noted bilaterally.  No signs of infections or ulcers noted.   Callus sub 5th  B/L and callus sub 2 right foot.  Onychomycosis  Pain in right toes  Pain in left toes  Callus  B/L.  Consent was obtained for treatment procedures.   Mechanical debridement of nails 1-5  bilaterally performed with a nail nipper.  Filed with dremel without incident.  Debride callus  B/L.   Return office visit    3 months                  Told patient to return for periodic foot care and evaluation due to potential at risk complications.   Helane Gunther DPM

## 2023-10-12 NOTE — Telephone Encounter (Signed)
Pt received Zilretta injection for LEFT knee OA on 09/21/23. Can consider repeat injection on or after 12/15/23

## 2023-10-14 DIAGNOSIS — S92354A Nondisplaced fracture of fifth metatarsal bone, right foot, initial encounter for closed fracture: Secondary | ICD-10-CM | POA: Diagnosis not present

## 2023-10-19 NOTE — Telephone Encounter (Signed)
I spoke with patient.  BP has been better since 11/19.  Readings have been 106-129/54-70.  She has not had to take prn hydralazine.  Patient will continue current medications.  She has appointment with Dr Jacinto Halim on 11/05/23 and will bring BP readings to this appointment.

## 2023-10-21 ENCOUNTER — Ambulatory Visit: Payer: Self-pay | Admitting: Cardiology

## 2023-10-28 DIAGNOSIS — S92354A Nondisplaced fracture of fifth metatarsal bone, right foot, initial encounter for closed fracture: Secondary | ICD-10-CM | POA: Diagnosis not present

## 2023-11-04 ENCOUNTER — Other Ambulatory Visit: Payer: Self-pay

## 2023-11-04 DIAGNOSIS — I10 Essential (primary) hypertension: Secondary | ICD-10-CM

## 2023-11-04 MED ORDER — SPIRONOLACTONE 25 MG PO TABS: 25.0000 mg | ORAL_TABLET | Freq: Every morning | ORAL | 1 refills | Status: DC

## 2023-11-05 ENCOUNTER — Encounter: Payer: Self-pay | Admitting: Cardiology

## 2023-11-05 ENCOUNTER — Ambulatory Visit: Payer: Medicare Other | Attending: Cardiology | Admitting: Cardiology

## 2023-11-05 VITALS — BP 132/68 | HR 48 | Resp 16 | Ht 69.0 in | Wt 181.6 lb

## 2023-11-05 DIAGNOSIS — R600 Localized edema: Secondary | ICD-10-CM | POA: Diagnosis not present

## 2023-11-05 DIAGNOSIS — I480 Paroxysmal atrial fibrillation: Secondary | ICD-10-CM | POA: Diagnosis not present

## 2023-11-05 DIAGNOSIS — N1832 Chronic kidney disease, stage 3b: Secondary | ICD-10-CM | POA: Diagnosis not present

## 2023-11-05 DIAGNOSIS — I10 Essential (primary) hypertension: Secondary | ICD-10-CM | POA: Insufficient documentation

## 2023-11-05 MED ORDER — AMLODIPINE BESYLATE 5 MG PO TABS: 5.0000 mg | ORAL_TABLET | Freq: Every day | ORAL | 3 refills | Status: DC

## 2023-11-05 NOTE — Progress Notes (Signed)
Cardiology Office Note:  .   Date:  11/05/2023  ID:  Jennifer Yang, DOB [birth year], MRN [id number] PCP: Merri Brunette, MD   [facility] Providers Cardiologist:  Yates Decamp, MD   History of Present Illness: .   Jennifer Yang is a 82 y.o. Caucasian female patient with hypertension, hyperlipidemia, prediabetes mellitus, had a accidental fall and had left femoral fracture in the last week of July 2022 and was admitted to [facility] and had new onset A. fib with RVR, spontaneously converted back to sinus rhythm but had a second episode 09/2282022, again brief and now on chronic anticoagulation with Eliquis due to  high embolic risk.  Discussed the use of AI scribe software for clinical note transcription with the patient, who gave verbal consent to proceed.  History of Present Illness   The patient, with a history of atrial fibrillation, high blood pressure, and stage three kidney disease, presents for a routine follow-up. She recently sustained a foot fracture and is considering surgery. She reports episodes of high blood pressure, which have improved since her medication was adjusted. She also describes episodes of dizziness, particularly when standing for extended periods. The patient has been managing these symptoms by sitting down when she feels dizzy. She has not reported any chest pain or shortness of breath. The patient's blood pressure has been well-controlled with her current medication regimen.        Review of Systems  Cardiovascular:  Negative for chest pain, dyspnea on exertion and leg swelling.    Labs    Lab Results  Component Value Date   NA 140 05/12/2023   K 4.9 05/12/2023   CO2 26 05/12/2023   GLUCOSE 114 (H) 05/12/2023   BUN 24 (H) 05/12/2023   CREATININE 1.56 (H) 05/12/2023   CALCIUM 10.3 05/12/2023   EGFR 33.0 08/17/2023   GFRNONAA 33 (L) 05/12/2023      Latest Ref Rng & Units 05/12/2023    9:28 AM 05/13/2022   10:00 AM 12/17/2021    1:17 PM   BMP  Glucose 70 - 99 mg/dL 564  332  89   BUN 8 - 23 mg/dL 24  24  24    Creatinine 0.44 - 1.00 mg/dL 9.51  8.84  1.66   BUN/Creat Ratio 12 - 28   18   Sodium 135 - 145 mmol/L 140  142  146   Potassium 3.5 - 5.1 mmol/L 4.9  4.4  4.9   Chloride 98 - 111 mmol/L 107  108  107   CO2 22 - 32 mmol/L 26  26  17    Calcium 8.9 - 10.3 mg/dL 06.3  01.6  01.0       Latest Ref Rng & Units 05/12/2023    9:28 AM 05/13/2022   10:00 AM 05/13/2021    9:18 AM  CBC  WBC 4.0 - 10.5 K/uL 5.7  5.8  5.9   Hemoglobin 12.0 - 15.0 g/dL 93.2  35.5  73.2   Hematocrit 36.0 - 46.0 % 38.1  37.6  40.3   Platelets 150 - 400 K/uL 161  168  160    External Labs:  Labs 07/31/2023:   Sodium 141, potassium 4.9, BUN 21, creatinine 1.57, EGFR 33 mL.  A1c 5.9%.  TSH normal at 1.80.  Total cholesterol 159, triglycerides 112, HDL 55, LDL 84.  Hb 11.4/HCT 36.3, platelets 135, normal indicis.  Labs 02/16/2023:   Hb 11.4/HCT 36.5, platelets: 55, normal indicis.   BUN 19, creatinine 1.31,  EGFR 38 mL, potassium 4.7, LFTs normal.   Total cholesterol 1 and 53, triglycerides 11/17/2012, HDL 51, LDL 79.   A1c 5.9%.  TSH normal at 1.80.  Physical Exam:   VS:  BP 132/68 (BP Location: Left Arm, Patient Position: Sitting, Cuff Size: Large)   Pulse (!) 48   Resp 16   Ht 5\' 9"  (1.753 m)   Wt 181 lb 9.6 oz (82.4 kg)   SpO2 98%   BMI 26.82 kg/m    Wt Readings from Last 3 Encounters:  11/05/23 181 lb 9.6 oz (82.4 kg)  09/21/23 183 lb (83 kg)  06/18/23 181 lb 6.4 oz (82.3 kg)     Physical Exam Neck:     Vascular: No carotid bruit or JVD.  Cardiovascular:     Rate and Rhythm: Regular rhythm. Bradycardia present.     Pulses: Intact distal pulses.     Heart sounds: Normal heart sounds. No murmur heard.    No gallop.  Pulmonary:     Effort: Pulmonary effort is normal.     Breath sounds: Normal breath sounds.  Abdominal:     General: Bowel sounds are normal.     Palpations: Abdomen is soft.  Musculoskeletal:         General: Deformity (right leg in cast and hence not examined. o foot edema) present.     Right lower leg: No edema.     Left lower leg: No edema.     Studies Reviewed: Marland Kitchen    [facility], [place], [place]   Echocardiogram 07/16/2021: Normal LV size and normal LV systolic function, EF 70%.  Mild LVH. Normal RV systolic function and normal size. Trivial pericardial effusion  EKG:    EKG Interpretation Date/Time:  Thursday November 05 2023 08:16:16 EST Ventricular Rate:  50 PR Interval:  178 QRS Duration:  100 QT Interval:  436 QTC Calculation: 397 R Axis:   -20  Text Interpretation: EKG 11/05/2023: Normal sinus rhythm/sinus bradycardia at the rate of 50 bpm, incomplete right bundle branch block.  Otherwise normal EKG.  Compared to 06/20/2019, heart rate has reduced from 87 bpm. Confirmed by Delrae Rend [id number] on 11/05/2023 8:20:01 AM    EKG 10/25/2023: Marked sinus bradycardia at the rate of 44 bpm, incomplete right bundle branch block.  Otherwise normal.  Compared to 06/27/2022, no change.   EKG 08/15/2021: Atrial fibrillation with rapid ventricular response at rate of 159 bpm.  Poor R wave progression.  Low-voltage complexes.  Nonspecific T abnormality.  Medications and allergies    Allergies  Allergen Reactions   Codeine Sulfate Swelling   Fish-Derived Products Other (See Comments)    Cellulitis    Rocephin [Ceftriaxone Sodium] Other (See Comments)    Low blood count   Sulfa Antibiotics Other (See Comments)    Mouth and lip sores     Current Outpatient Medications:    ALPRAZolam (XANAX) 0.5 MG tablet, Take 0.5 mg by mouth daily as needed for anxiety. , Disp: , Rfl:    apixaban (ELIQUIS) 5 MG TABS tablet, Take 1 tablet (5 mg total) by mouth 2 (two) times daily., Disp: 200 tablet, Rfl: 3   Cholecalciferol (D3 2000) 50 MCG (2000 UT) CAPS, Take 4,000 Units by mouth daily., Disp: , Rfl:    fluticasone (FLONASE) 50 MCG/ACT nasal spray, Place 1-2 sprays into both nostrils  daily as needed for allergies or rhinitis., Disp: , Rfl:    hydrALAZINE (APRESOLINE) 50 MG tablet, Take 0.5 tablets (25 mg total) by mouth  as directed. 1/2 tablet every 6 hours for SBP >140 mmHg., Disp: 270 tablet, Rfl: 3   losartan (COZAAR) 100 MG tablet, Take 100 mg by mouth daily. , Disp: , Rfl:    metoprolol succinate (TOPROL-XL) 25 MG 24 hr tablet, TAKE 1 TABLET BY MOUTH DAILY. TAKE WITH OR IMMEDIATELY FOLLOWING A MEAL., Disp: 90 tablet, Rfl: 3   omeprazole (PRILOSEC) 40 MG capsule, Take 40 mg by mouth as needed., Disp: , Rfl:    Probiotic Product (ALIGN PO), Take by mouth., Disp: , Rfl:    rosuvastatin (CRESTOR) 5 MG tablet, Take 5 mg by mouth daily., Disp: , Rfl: 0   spironolactone (ALDACTONE) 25 MG tablet, Take 1 tablet (25 mg total) by mouth in the morning., Disp: 90 tablet, Rfl: 1   amLODipine (NORVASC) 5 MG tablet, Take 1 tablet (5 mg total) by mouth daily., Disp: 90 tablet, Rfl: 3   ASSESSMENT AND PLAN: .      ICD-10-CM   1. Paroxysmal atrial fibrillation (HCC)  I48.0 EKG 12-Lead    2. Primary hypertension  I10 amLODipine (NORVASC) 5 MG tablet    3. Stage 3b chronic kidney disease (HCC)  N18.32     4. Bilateral leg edema  R60.0      Click Here to Calculate/Change CHADS2VASc Score The patient's CHADS2-VASc score is 6, indicating a 9.7% annual risk of stroke.  Therefore, anticoagulation is recommended.   CHF History: No HTN History: Yes Diabetes History: Yes (Pre DM) Stroke History: No Vascular Disease History: Yes (Coronary atherosclerosis)   Assessment and Plan    Atrial Fibrillation Maintaining regular rhythm. No symptoms of heart racing, shortness of breath, or chest pain. -Continue current blood thinner regimen.  Hypertension Blood pressure well controlled on current regimen of amlodipine 5mg , spironolactone 25mg , metoprolol succinate 25mg , hydralazine 50mg  as needed, and losartan 100mg  daily. -Continue current regimen. -Send new prescription for amlodipine  5mg .  Orthostatic Hypotension Reports feeling dizzy and like passing out when standing still for prolonged periods. Likely due to blood pooling in legs from antihypertensive medications. -Advise to sit down occasionally when standing for long periods. -Consider wearing support stockings during prolonged standing.  Chronic Kidney Disease (Stage 3) Stable kidney function. -Continue losartan for kidney protection.  Foot Fracture Considering surgery. -Approve for surgery from a cardiac standpoint. -Plan to stop blood thinner 2-3 days before surgery and restart a couple of days post-surgery when surgeon is comfortable.  Follow-up in 1 year.     With regard to leg edema, since reducing the dose of the amlodipine to 5 mg daily, she has had complete resolution.  Signed,  Yates Decamp, MD, [facility] 11/05/2023, 8:35 AM [facility] [address] [place], [place] [address] Phone: [phone]. Fax:  [phone]

## 2023-11-05 NOTE — Patient Instructions (Signed)

## 2023-11-06 ENCOUNTER — Other Ambulatory Visit: Payer: Self-pay

## 2023-11-06 ENCOUNTER — Ambulatory Visit: Payer: Self-pay | Admitting: General Surgery

## 2023-11-06 ENCOUNTER — Telehealth: Payer: Self-pay | Admitting: Cardiology

## 2023-11-06 DIAGNOSIS — L989 Disorder of the skin and subcutaneous tissue, unspecified: Secondary | ICD-10-CM | POA: Diagnosis not present

## 2023-11-06 DIAGNOSIS — Z170 Estrogen receptor positive status [ER+]: Secondary | ICD-10-CM | POA: Diagnosis not present

## 2023-11-06 DIAGNOSIS — I10 Essential (primary) hypertension: Secondary | ICD-10-CM

## 2023-11-06 DIAGNOSIS — C50911 Malignant neoplasm of unspecified site of right female breast: Secondary | ICD-10-CM | POA: Diagnosis not present

## 2023-11-06 MED ORDER — AMLODIPINE BESYLATE 5 MG PO TABS: 5.0000 mg | ORAL_TABLET | Freq: Every day | ORAL | 3 refills | Status: DC

## 2023-11-06 NOTE — Telephone Encounter (Signed)
*  STAT* If patient is at the pharmacy, call can be transferred to refill team.   1. Which medications need to be refilled? (please list name of each medication and dose if known) amLODipine (NORVASC) 5 MG tablet    2. Would you like to learn more about the convenience, safety, & potential cost savings by using the [facility]? N/A     3. Are you open to using the [facility] (Type [facility]. N/A   4. Which pharmacy/location (including street and city if local pharmacy) is medication to be sent to?  [id number] - [place], [place] - [address]     5. Do they need a 30 day or 90 day supply? 90 day

## 2023-11-09 ENCOUNTER — Other Ambulatory Visit: Payer: Self-pay | Admitting: Cardiology

## 2023-11-09 DIAGNOSIS — I10 Essential (primary) hypertension: Secondary | ICD-10-CM

## 2023-11-09 MED ORDER — AMLODIPINE BESYLATE 5 MG PO TABS: 5.0000 mg | ORAL_TABLET | Freq: Every day | ORAL | 3 refills | Status: DC

## 2023-11-19 NOTE — Telephone Encounter (Signed)
VOB initiated for ZILRETTA for LEFT knee OA

## 2023-12-02 DIAGNOSIS — S92354A Nondisplaced fracture of fifth metatarsal bone, right foot, initial encounter for closed fracture: Secondary | ICD-10-CM | POA: Diagnosis not present

## 2023-12-02 NOTE — Telephone Encounter (Signed)
 [facility] - Prior Authorization NOT required for Zilretta   [facility] Pharm[facility] Patent attorney) - Prior Authorization REQUIRED     Primary Medicare:     Secondary: A[employer]na Medicare Supplement Plan G

## 2023-12-08 DIAGNOSIS — H10413 Chronic giant papillary conjunctivitis, bilateral: Secondary | ICD-10-CM | POA: Diagnosis not present

## 2023-12-08 DIAGNOSIS — H2000 Unspecified acute and subacute iridocyclitis: Secondary | ICD-10-CM | POA: Diagnosis not present

## 2023-12-08 DIAGNOSIS — H5711 Ocular pain, right eye: Secondary | ICD-10-CM | POA: Diagnosis not present

## 2023-12-11 DIAGNOSIS — H2000 Unspecified acute and subacute iridocyclitis: Secondary | ICD-10-CM | POA: Diagnosis not present

## 2023-12-11 DIAGNOSIS — H10411 Chronic giant papillary conjunctivitis, right eye: Secondary | ICD-10-CM | POA: Diagnosis not present

## 2023-12-24 ENCOUNTER — Ambulatory Visit: Payer: Medicare Other | Admitting: Cardiology

## 2023-12-25 DIAGNOSIS — H0100A Unspecified blepharitis right eye, upper and lower eyelids: Secondary | ICD-10-CM | POA: Diagnosis not present

## 2023-12-28 NOTE — Telephone Encounter (Signed)
 ZILRETTA  for LEFT knee OA  OK to schedule on or after 12/15/23  Primary Insurance: Medicare Co-pay: n/a Co-insurance: 20% Deductible: $0 of $257 met - must be met for coverage to apply Prior Auth: NOT required  Secondary Insurance: [employer] Adv Plan G Co-pay: n/a Co-insurance: Covers Medicare Part B co-insurance Deductible: Medicare deductible must be met for coverage to apply Prior Auth: NOT required   Knee Injection History 07/09/22 - Zilretta  LEFT 10/22/22 - Zilretta  LEFT 02/25/23 - Zilretta  LEFT 06/18/23 - Zilretta  LEFT 09/21/23 - Zilretta  LEFT

## 2023-12-29 NOTE — Telephone Encounter (Signed)
Holding until needed by patient.

## 2024-01-04 DIAGNOSIS — S92354A Nondisplaced fracture of fifth metatarsal bone, right foot, initial encounter for closed fracture: Secondary | ICD-10-CM | POA: Diagnosis not present

## 2024-01-13 ENCOUNTER — Ambulatory Visit: Payer: Medicare Other | Admitting: Podiatry

## 2024-01-19 ENCOUNTER — Other Ambulatory Visit: Payer: Self-pay | Admitting: Cardiology

## 2024-01-19 DIAGNOSIS — I10 Essential (primary) hypertension: Secondary | ICD-10-CM

## 2024-01-29 ENCOUNTER — Other Ambulatory Visit: Payer: Self-pay | Admitting: Family

## 2024-02-01 DIAGNOSIS — D0461 Carcinoma in situ of skin of right upper limb, including shoulder: Secondary | ICD-10-CM | POA: Diagnosis not present

## 2024-02-01 DIAGNOSIS — S92354A Nondisplaced fracture of fifth metatarsal bone, right foot, initial encounter for closed fracture: Secondary | ICD-10-CM | POA: Diagnosis not present

## 2024-02-01 DIAGNOSIS — D492 Neoplasm of unspecified behavior of bone, soft tissue, and skin: Secondary | ICD-10-CM | POA: Diagnosis not present

## 2024-02-11 DIAGNOSIS — Z01419 Encounter for gynecological examination (general) (routine) without abnormal findings: Secondary | ICD-10-CM | POA: Diagnosis not present

## 2024-02-11 NOTE — Telephone Encounter (Signed)
 Patient would like to schedule an appointment for Zilretta. Can you confirm that we are still good with the authorization?

## 2024-02-16 NOTE — Telephone Encounter (Signed)
 Noted.

## 2024-02-16 NOTE — Telephone Encounter (Signed)
 Scheduled 02/19/2024

## 2024-02-19 ENCOUNTER — Other Ambulatory Visit: Payer: Self-pay

## 2024-02-19 ENCOUNTER — Ambulatory Visit: Admitting: Family Medicine

## 2024-02-19 VITALS — BP 126/80 | HR 61 | Ht 69.0 in | Wt 179.0 lb

## 2024-02-19 DIAGNOSIS — G8929 Other chronic pain: Secondary | ICD-10-CM | POA: Diagnosis not present

## 2024-02-19 DIAGNOSIS — M1712 Unilateral primary osteoarthritis, left knee: Secondary | ICD-10-CM

## 2024-02-19 DIAGNOSIS — M25562 Pain in left knee: Secondary | ICD-10-CM | POA: Diagnosis not present

## 2024-02-19 MED ORDER — TRIAMCINOLONE ACETONIDE 32 MG IX SRER
32.0000 mg | Freq: Once | INTRA_ARTICULAR | Status: AC
  Administered 2024-02-19: 32 mg via INTRA_ARTICULAR

## 2024-02-19 NOTE — Patient Instructions (Addendum)
 Thank you for coming in today.   You received an injection today. Seek immediate medical attention if the joint becomes red, extremely painful, or is oozing fluid.   Check back as needed

## 2024-02-19 NOTE — Progress Notes (Addendum)
   Rubin Payor, PhD, LAT, ATC acting as a scribe for Clementeen Graham, MD.  DACIE MANDEL is a 83 y.o. female who presents to [facility] today for exacerbation of her L knee pain. Pt was last seen by Dr. Denyse Amass on 09/21/23 and was given a repeat Zilretta injection.   Today, pt reports L knee pain returned over the last month- 1.5 months. She has been dealing w/ a MT fx.   Dx imaging: 06/04/22 L knee XR (done at [facility])   Pertinent review of systems: No fevers or chills  Relevant historical information: Osteoporosis and hypertension and history of breast cancer.   Exam:  BP 126/80   Pulse 61   Ht 5\' 9"  (1.753 m)   Wt 179 lb (81.2 kg)   SpO2 98%   BMI 26.43 kg/m  General: Well Developed, well nourished, and in no acute distress.   MSK: Left knee mild effusion normal motion with crepitation.    Lab and Radiology Results   Zilretta injection left knee Procedure: Real-time Ultrasound Guided Injection of left knee joint superior lateral patellar space Device: Philips Affiniti 50G Images permanently stored and available for review in PACS Verbal informed consent obtained.  Discussed risks and benefits of procedure. Warned about infection, hyperglycemia bleeding, damage to structures among others. Patient expresses understanding and agreement Time-out conducted.   Noted no overlying erythema, induration, or other signs of local infection.   Skin prepped in a sterile fashion.   Local anesthesia: Topical Ethyl chloride.   With sterile technique and under real time ultrasound guidance: Zilretta 32 mg injected into knee joint. Fluid seen entering the joint capsule.   Completed without difficulty   Advised to call if fevers/chills, erythema, induration, drainage, or persistent bleeding.   Images permanently stored and available for review in the ultrasound unit.  Impression: Technically successful ultrasound guided injection. Lot Number: [id number]      Assessment and Plan: 83 y.o. female with chronic left knee pain due to DJD.  Patient is having an exacerbation of this pain.  Plan for repeat Zilretta injection today.  Could repeat this in 3 months if needed.  Of note patient does have manage osteoporosis currently managed with infusions of bisphosphonates. PDMP not reviewed this encounter. Orders Placed This Encounter  Procedures   [place] LIMITED JOINT SPACE STRUCTURES LOW LEFT(NO LINKED CHARGES)    Reason for Exam (SYMPTOM  OR DIAGNOSIS REQUIRED):   lt knee inj    Preferred imaging location?:   [facility]   Meds ordered this encounter  Medications   Triamcinolone Acetonide (ZILRETTA) intra-articular injection 32 mg     Discussed warning signs or symptoms. Please see discharge instructions. Patient expresses understanding.   The above documentation has been reviewed and is accurate and complete Clementeen Graham, M.D.

## 2024-02-22 DIAGNOSIS — M810 Age-related osteoporosis without current pathological fracture: Secondary | ICD-10-CM | POA: Diagnosis not present

## 2024-02-22 DIAGNOSIS — Z0000 Encounter for general adult medical examination without abnormal findings: Secondary | ICD-10-CM | POA: Diagnosis not present

## 2024-02-22 DIAGNOSIS — I10 Essential (primary) hypertension: Secondary | ICD-10-CM | POA: Diagnosis not present

## 2024-02-22 DIAGNOSIS — E538 Deficiency of other specified B group vitamins: Secondary | ICD-10-CM | POA: Diagnosis not present

## 2024-02-22 DIAGNOSIS — Z1231 Encounter for screening mammogram for malignant neoplasm of breast: Secondary | ICD-10-CM | POA: Diagnosis not present

## 2024-02-22 LAB — HM MAMMOGRAPHY

## 2024-02-22 LAB — LAB REPORT - SCANNED: EGFR: 32

## 2024-02-24 DIAGNOSIS — Z08 Encounter for follow-up examination after completed treatment for malignant neoplasm: Secondary | ICD-10-CM | POA: Diagnosis not present

## 2024-02-24 DIAGNOSIS — L570 Actinic keratosis: Secondary | ICD-10-CM | POA: Diagnosis not present

## 2024-02-24 DIAGNOSIS — D225 Melanocytic nevi of trunk: Secondary | ICD-10-CM | POA: Diagnosis not present

## 2024-02-24 DIAGNOSIS — L821 Other seborrheic keratosis: Secondary | ICD-10-CM | POA: Diagnosis not present

## 2024-02-24 DIAGNOSIS — Z85828 Personal history of other malignant neoplasm of skin: Secondary | ICD-10-CM | POA: Diagnosis not present

## 2024-02-24 DIAGNOSIS — L814 Other melanin hyperpigmentation: Secondary | ICD-10-CM | POA: Diagnosis not present

## 2024-02-29 DIAGNOSIS — R7989 Other specified abnormal findings of blood chemistry: Secondary | ICD-10-CM | POA: Diagnosis not present

## 2024-02-29 DIAGNOSIS — I700 Atherosclerosis of aorta: Secondary | ICD-10-CM | POA: Diagnosis not present

## 2024-02-29 DIAGNOSIS — I10 Essential (primary) hypertension: Secondary | ICD-10-CM | POA: Diagnosis not present

## 2024-02-29 DIAGNOSIS — E785 Hyperlipidemia, unspecified: Secondary | ICD-10-CM | POA: Diagnosis not present

## 2024-02-29 DIAGNOSIS — Z7901 Long term (current) use of anticoagulants: Secondary | ICD-10-CM | POA: Diagnosis not present

## 2024-02-29 DIAGNOSIS — M810 Age-related osteoporosis without current pathological fracture: Secondary | ICD-10-CM | POA: Diagnosis not present

## 2024-02-29 DIAGNOSIS — E1122 Type 2 diabetes mellitus with diabetic chronic kidney disease: Secondary | ICD-10-CM | POA: Diagnosis not present

## 2024-02-29 DIAGNOSIS — N183 Chronic kidney disease, stage 3 unspecified: Secondary | ICD-10-CM | POA: Diagnosis not present

## 2024-02-29 DIAGNOSIS — Z0000 Encounter for general adult medical examination without abnormal findings: Secondary | ICD-10-CM | POA: Diagnosis not present

## 2024-02-29 DIAGNOSIS — I480 Paroxysmal atrial fibrillation: Secondary | ICD-10-CM | POA: Diagnosis not present

## 2024-03-01 ENCOUNTER — Ambulatory Visit (INDEPENDENT_AMBULATORY_CARE_PROVIDER_SITE_OTHER): Admitting: Podiatry

## 2024-03-01 ENCOUNTER — Encounter: Payer: Self-pay | Admitting: Podiatry

## 2024-03-01 DIAGNOSIS — L84 Corns and callosities: Secondary | ICD-10-CM | POA: Diagnosis not present

## 2024-03-01 DIAGNOSIS — B351 Tinea unguium: Secondary | ICD-10-CM | POA: Diagnosis not present

## 2024-03-01 DIAGNOSIS — E1122 Type 2 diabetes mellitus with diabetic chronic kidney disease: Secondary | ICD-10-CM | POA: Diagnosis not present

## 2024-03-01 DIAGNOSIS — R7989 Other specified abnormal findings of blood chemistry: Secondary | ICD-10-CM | POA: Diagnosis not present

## 2024-03-01 DIAGNOSIS — M79675 Pain in left toe(s): Secondary | ICD-10-CM

## 2024-03-01 DIAGNOSIS — Z780 Asymptomatic menopausal state: Secondary | ICD-10-CM | POA: Diagnosis not present

## 2024-03-01 DIAGNOSIS — D689 Coagulation defect, unspecified: Secondary | ICD-10-CM | POA: Diagnosis not present

## 2024-03-01 DIAGNOSIS — M79674 Pain in right toe(s): Secondary | ICD-10-CM

## 2024-03-01 NOTE — Progress Notes (Signed)
 This patient returns to my office for at risk foot care.  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  This patient is unable to cut nails herself since the patient cannot reach her nails.These nails are painful walking and wearing shoes.  She says the callus on her feet are painful walking.This patient presents for at risk foot care today.  General Appearance  Alert, conversant and in no acute stress.  Vascular  Dorsalis pedis and posterior tibial  pulses are  weakly palpable  bilaterally.  Capillary return is within normal limits  bilaterally. Temperature is within normal limits  bilaterally.  Neurologic  Senn-Weinstein monofilament wire test within normal limits  bilaterally. Muscle power within normal limits bilaterally.  Nails Thick disfigured discolored nails with subungual debris  hallux nails bilaterally. No evidence of bacterial infection or drainage bilaterally.  Orthopedic  No limitations of motion  feet .  No crepitus or effusions noted.  HAV  B/L.  Contracted digits  B/L.  Plantar flexed fifth metatarsal  B/L.  Cavus foot type.  Skin  normotropic skin with no porokeratosis noted bilaterally.  No signs of infections or ulcers noted.   Callus sub 5th  B/L and callus sub 2 right foot.  Onychomycosis  Pain in right toes  Pain in left toes  Callus  B/L.  Consent was obtained for treatment procedures.   Mechanical debridement of nails 1-5  bilaterally performed with a nail nipper.  Filed with dremel without incident.  Debride callus  B/L.   Return office visit    3 months                  Told patient to return for periodic foot care and evaluation due to potential at risk complications.   Helane Gunther DPM

## 2024-03-03 DIAGNOSIS — S92354D Nondisplaced fracture of fifth metatarsal bone, right foot, subsequent encounter for fracture with routine healing: Secondary | ICD-10-CM | POA: Diagnosis not present

## 2024-03-21 DIAGNOSIS — D0471 Carcinoma in situ of skin of right lower limb, including hip: Secondary | ICD-10-CM | POA: Diagnosis not present

## 2024-04-27 ENCOUNTER — Other Ambulatory Visit: Payer: Self-pay | Admitting: Cardiology

## 2024-04-27 DIAGNOSIS — I10 Essential (primary) hypertension: Secondary | ICD-10-CM

## 2024-05-11 ENCOUNTER — Inpatient Hospital Stay: Payer: Medicare Other | Attending: Hematology & Oncology

## 2024-05-11 ENCOUNTER — Encounter: Payer: Self-pay | Admitting: Hematology & Oncology

## 2024-05-11 ENCOUNTER — Inpatient Hospital Stay (HOSPITAL_BASED_OUTPATIENT_CLINIC_OR_DEPARTMENT_OTHER): Payer: Medicare Other | Admitting: Hematology & Oncology

## 2024-05-11 ENCOUNTER — Inpatient Hospital Stay: Payer: Medicare Other

## 2024-05-11 VITALS — BP 150/48 | HR 48 | Temp 97.8°F | Resp 20 | Ht 69.0 in | Wt 180.8 lb

## 2024-05-11 DIAGNOSIS — C50011 Malignant neoplasm of nipple and areola, right female breast: Secondary | ICD-10-CM

## 2024-05-11 DIAGNOSIS — T50905A Adverse effect of unspecified drugs, medicaments and biological substances, initial encounter: Secondary | ICD-10-CM | POA: Diagnosis not present

## 2024-05-11 DIAGNOSIS — C50911 Malignant neoplasm of unspecified site of right female breast: Secondary | ICD-10-CM

## 2024-05-11 DIAGNOSIS — E559 Vitamin D deficiency, unspecified: Secondary | ICD-10-CM

## 2024-05-11 DIAGNOSIS — Z853 Personal history of malignant neoplasm of breast: Secondary | ICD-10-CM | POA: Insufficient documentation

## 2024-05-11 DIAGNOSIS — M810 Age-related osteoporosis without current pathological fracture: Secondary | ICD-10-CM | POA: Diagnosis not present

## 2024-05-11 DIAGNOSIS — M818 Other osteoporosis without current pathological fracture: Secondary | ICD-10-CM | POA: Diagnosis not present

## 2024-05-11 LAB — CBC WITH DIFFERENTIAL (CANCER CENTER ONLY)
Abs Immature Granulocytes: 0.02 10*3/uL (ref 0.00–0.07)
Basophils Absolute: 0.1 10*3/uL (ref 0.0–0.1)
Basophils Relative: 1 %
Eosinophils Absolute: 0.1 10*3/uL (ref 0.0–0.5)
Eosinophils Relative: 1 %
HCT: 37.9 % (ref 36.0–46.0)
Hemoglobin: 12.2 g/dL (ref 12.0–15.0)
Immature Granulocytes: 0 %
Lymphocytes Relative: 32 %
Lymphs Abs: 1.9 10*3/uL (ref 0.7–4.0)
MCH: 30.2 pg (ref 26.0–34.0)
MCHC: 32.2 g/dL (ref 30.0–36.0)
MCV: 93.8 fL (ref 80.0–100.0)
Monocytes Absolute: 0.4 10*3/uL (ref 0.1–1.0)
Monocytes Relative: 7 %
Neutro Abs: 3.5 10*3/uL (ref 1.7–7.7)
Neutrophils Relative %: 59 %
Platelet Count: 160 10*3/uL (ref 150–400)
RBC: 4.04 MIL/uL (ref 3.87–5.11)
RDW: 12.6 % (ref 11.5–15.5)
WBC Count: 6 10*3/uL (ref 4.0–10.5)
nRBC: 0 % (ref 0.0–0.2)

## 2024-05-11 LAB — CMP (CANCER CENTER ONLY)
ALT: 10 U/L (ref 0–44)
AST: 19 U/L (ref 15–41)
Albumin: 4.6 g/dL (ref 3.5–5.0)
Alkaline Phosphatase: 38 U/L (ref 38–126)
Anion gap: 10 (ref 5–15)
BUN: 21 mg/dL (ref 8–23)
CO2: 23 mmol/L (ref 22–32)
Calcium: 9.8 mg/dL (ref 8.9–10.3)
Chloride: 106 mmol/L (ref 98–111)
Creatinine: 1.44 mg/dL — ABNORMAL HIGH (ref 0.44–1.00)
GFR, Estimated: 36 mL/min — ABNORMAL LOW (ref >60)
Glucose, Bld: 104 mg/dL — ABNORMAL HIGH (ref 70–99)
Potassium: 5 mmol/L (ref 3.5–5.1)
Sodium: 139 mmol/L (ref 135–145)
Total Bilirubin: 1 mg/dL (ref 0.0–1.2)
Total Protein: 7 g/dL (ref 6.5–8.1)

## 2024-05-11 LAB — VITAMIN D 25 HYDROXY (VIT D DEFICIENCY, FRACTURES): Vit D, 25-Hydroxy: 49.72 ng/mL (ref 30–100)

## 2024-05-11 MED ORDER — AZITHROMYCIN 250 MG PO TABS: ORAL_TABLET | ORAL | 3 refills | Status: AC

## 2024-05-11 NOTE — Progress Notes (Signed)
 Hematology and Oncology Follow Up Visit  Jennifer Yang [id number] [birth year] 83 y.o. 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  She is seen yearly.  She is doing okay.  Since we last saw her, she broke her right foot.  This happened while she was sent down.  She just went to move and heard a pop in her right foot.  She wore a boot for about 2 months.  She did not want surgery because of the history of atrial fibrillation that she has.  Otherwise, she is doing okay.  She is wondering if she can stop the Zometa .  I do not have a problem with her stopping the Zometa .  I told her to make sure she takes vitamin D  and calcium .  She has had no change in bowel or bladder habits.  She has had no cough or shortness of breath.  She has had no change in bowel or bladder habits.  She has had no rashes.  She has had no bleeding.  She had a mammogram that was done in April.  The mammogram looked fantastic.  Overall, I would say that her performance status is ECOG 1.    Medications:  Allergies as of 05/11/2024       Reactions   Codeine Sulfate Swelling   Fish-derived Products Other (See Comments)   Cellulitis    Rocephin [ceftriaxone Sodium] Other (See Comments)   Low blood count   Sulfa Antibiotics Other (See Comments)   Mouth and lip sores        Medication List        Accurate as of May 11, 2024 11:09 AM. If you have any questions, ask your nurse or doctor.          ALIGN PO Take by mouth. What changed:  when to take this reasons to take this   ALPRAZolam  0.5 MG tablet Commonly known as: XANAX  Take 0.5 mg by mouth daily as needed for anxiety.   amLODipine  5 MG tablet Commonly known  as: NORVASC  Take 1 tablet (5 mg total) by mouth daily.   apixaban  5 MG Tabs tablet Commonly known as: Eliquis  Take 1 tablet (5 mg total) by mouth 2 (two) times daily.   azithromycin  250 MG tablet Commonly known as: ZITHROMAX  TAKE 2 TABLETS BY MOUTH TODAY, THEN TAKE 1 TABLET DAILY FOR 4 DAYS   D3 2000 50 MCG (2000 UT) Caps Generic drug: Cholecalciferol  Take 4,000 Units by mouth daily.   fluticasone  50 MCG/ACT nasal spray Commonly known as: FLONASE  Place 1-2 sprays into both nostrils daily as needed for allergies or rhinitis.   hydrALAZINE  50 MG tablet Commonly known as: APRESOLINE  Take 0.5 tablets (25 mg total) by mouth as directed. 1/2 tablet every 6 hours for SBP >140 mmHg.   losartan  100 MG tablet Commonly known as: COZAAR  Take 100 mg by mouth daily.   metoprolol  succinate 25 MG 24 hr tablet Commonly known as: TOPROL -XL TAKE 1 TABLET BY MOUTH DAILY. TAKE WITH OR IMMEDIATELY FOLLOWING A MEAL.   omeprazole 40 MG capsule Commonly known as: PRILOSEC Take 40 mg by mouth as needed.  rosuvastatin  5 MG tablet Commonly known as: CRESTOR  Take 5 mg by mouth daily.   spironolactone  25 MG tablet Commonly known as: ALDACTONE  TAKE 1 TABLET (25 MG TOTAL) BY MOUTH IN THE MORNING        Allergies:  Allergies  Allergen Reactions   Codeine Sulfate Swelling   Fish-Derived Products Other (See Comments)    Cellulitis    Rocephin [Ceftriaxone Sodium] Other (See Comments)    Low blood count   Sulfa Antibiotics Other (See Comments)    Mouth and lip sores    Past Medical History, Surgical history, Social history, and Family History were reviewed and updated.  Review of Systems: All other 10 point review of systems is negative.   Physical Exam:  height is 5' 9 (1.753 m) and weight is 180 lb 12.8 oz (82 kg). Her oral temperature is 97.8 F (36.6 C). Her blood pressure is 150/48 (abnormal) and her pulse is 48 (abnormal). Her respiration is 20 and oxygen saturation is 100%.    Wt Readings from Last 3 Encounters:  05/11/24 180 lb 12.8 oz (82 kg)  02/19/24 179 lb (81.2 kg)  11/05/23 181 lb 9.6 oz (82.4 kg)    Physical Exam Constitutional:      Comments: Her breast exam shows left breast with no masses, edema or erythema.  There is no left axillary adenopathy.  Right chest wall shows well-healed mastectomy.  She has no nodularity.  There is no erythema.  There is no tenderness.  There is no right axillary adenopathy.   Musculoskeletal:     Comments: She does have chronic lymphedema of the right arm.  There is no erythema.  She has good pulses.  She has good range of motion of her joints.      Lab Results  Component Value Date   WBC 6.0 05/11/2024   HGB 12.2 05/11/2024   HCT 37.9 05/11/2024   MCV 93.8 05/11/2024   PLT 160 05/11/2024   No results found for: FERRITIN, IRON, TIBC, UIBC, IRONPCTSAT Lab Results  Component Value Date   RBC 4.04 05/11/2024   No results found for: KPAFRELGTCHN, LAMBDASER, KAPLAMBRATIO No results found for: KIMBERLY LE, IGMSERUM No results found for: STEPHANY CARLOTA BENSON MARKEL EARLA JOANNIE DOC VICK, SPEI   Chemistry      Component Value Date/Time   NA 139 05/11/2024 0941   NA 146 (H) 12/17/2021 1317   NA 146 (H) 11/09/2017 1135   NA 144 05/24/2013 0959   K 5.0 05/11/2024 0941   K 3.9 11/09/2017 1135   K 3.9 05/24/2013 0959   CL 106 05/11/2024 0941   CL 104 11/09/2017 1135   CO2 23 05/11/2024 0941   CO2 28 11/09/2017 1135   CO2 27 05/24/2013 0959   BUN 21 05/11/2024 0941   BUN 24 12/17/2021 1317   BUN 23 (H) 11/09/2017 1135   BUN 19.7 05/24/2013 0959   CREATININE 1.44 (H) 05/11/2024 0941   CREATININE 1.1 11/09/2017 1135   CREATININE 1.1 05/24/2013 0959      Component Value Date/Time   CALCIUM  9.8 05/11/2024 0941   CALCIUM  9.4 11/09/2017 1135   CALCIUM  9.8 05/24/2013 0959   ALKPHOS 38 05/11/2024 0941   ALKPHOS 57 11/09/2017 1135   AST 19  05/11/2024 0941   ALT 10 05/11/2024 0941   ALT 23 11/09/2017 1135   BILITOT 1.0 05/11/2024 0941       Impression and Plan: Jennifer Yang is a very pleasant 83 yo caucasian female with history  of 2 separate primary breast cancers, both stage 1 and ER positive.  She is doing well and so far there has been no evidence of recurrence.  I really think we can hold her Zometa  for right now.  She had been doing this for about 5 or 6 years.  I am really not sure how much more is really helping her.  We will plan to get her back in 1 year.  Maude JONELLE Crease, MD 6/25/202511:09 AM

## 2024-05-11 NOTE — Progress Notes (Signed)
 BP remains elevated, 153/50, instructed to monitor daily and if if remains over 140/90, notify Cardiologist. She has an appointment 06/12/24.  Verbalized understanding.

## 2024-05-15 ENCOUNTER — Other Ambulatory Visit: Payer: Self-pay | Admitting: Cardiology

## 2024-05-15 DIAGNOSIS — I10 Essential (primary) hypertension: Secondary | ICD-10-CM

## 2024-05-15 DIAGNOSIS — I480 Paroxysmal atrial fibrillation: Secondary | ICD-10-CM

## 2024-05-24 DIAGNOSIS — H26493 Other secondary cataract, bilateral: Secondary | ICD-10-CM | POA: Diagnosis not present

## 2024-05-24 DIAGNOSIS — H3500 Unspecified background retinopathy: Secondary | ICD-10-CM | POA: Diagnosis not present

## 2024-05-24 DIAGNOSIS — H353132 Nonexudative age-related macular degeneration, bilateral, intermediate dry stage: Secondary | ICD-10-CM | POA: Diagnosis not present

## 2024-05-24 DIAGNOSIS — H43813 Vitreous degeneration, bilateral: Secondary | ICD-10-CM | POA: Diagnosis not present

## 2024-05-31 ENCOUNTER — Ambulatory Visit (INDEPENDENT_AMBULATORY_CARE_PROVIDER_SITE_OTHER): Admitting: Podiatry

## 2024-05-31 ENCOUNTER — Encounter: Payer: Self-pay | Admitting: Podiatry

## 2024-05-31 DIAGNOSIS — D689 Coagulation defect, unspecified: Secondary | ICD-10-CM

## 2024-05-31 DIAGNOSIS — B351 Tinea unguium: Secondary | ICD-10-CM | POA: Diagnosis not present

## 2024-05-31 DIAGNOSIS — L84 Corns and callosities: Secondary | ICD-10-CM | POA: Diagnosis not present

## 2024-05-31 DIAGNOSIS — M79675 Pain in left toe(s): Secondary | ICD-10-CM

## 2024-05-31 DIAGNOSIS — M79674 Pain in right toe(s): Secondary | ICD-10-CM | POA: Diagnosis not present

## 2024-05-31 NOTE — Progress Notes (Signed)
 This patient returns to my office for at risk foot care.  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  This patient is unable to cut nails herself since the patient cannot reach her nails.These nails are painful walking and wearing shoes.  She says the callus on her feet are painful walking.This patient presents for at risk foot care today.  General Appearance  Alert, conversant and in no acute stress.  Vascular  Dorsalis pedis and posterior tibial  pulses are  weakly palpable  bilaterally.  Capillary return is within normal limits  bilaterally. Temperature is within normal limits  bilaterally.  Neurologic  Senn-Weinstein monofilament wire test within normal limits  bilaterally. Muscle power within normal limits bilaterally.  Nails Thick disfigured discolored nails with subungual debris  hallux nails bilaterally. No evidence of bacterial infection or drainage bilaterally.  Orthopedic  No limitations of motion  feet .  No crepitus or effusions noted.  HAV  B/L.  Contracted digits  B/L.  Plantar flexed fifth metatarsal  B/L.  Cavus foot type.  Skin  normotropic skin with no porokeratosis noted bilaterally.  No signs of infections or ulcers noted.   Callus sub 5th  B/L and callus sub 2 right foot.  Onychomycosis  Pain in right toes  Pain in left toes  Callus  B/L.  Consent was obtained for treatment procedures.   Mechanical debridement of nails 1-5  bilaterally performed with a nail nipper.  Filed with dremel without incident.  Debride callus  B/L.   Return office visit    3 months                  Told patient to return for periodic foot care and evaluation due to potential at risk complications.   Helane Gunther DPM

## 2024-06-06 DIAGNOSIS — K2101 Gastro-esophageal reflux disease with esophagitis, with bleeding: Secondary | ICD-10-CM | POA: Diagnosis not present

## 2024-06-06 DIAGNOSIS — Z860100 Personal history of colon polyps, unspecified: Secondary | ICD-10-CM | POA: Diagnosis not present

## 2024-06-06 DIAGNOSIS — I4819 Other persistent atrial fibrillation: Secondary | ICD-10-CM | POA: Diagnosis not present

## 2024-06-07 ENCOUNTER — Ambulatory Visit (INDEPENDENT_AMBULATORY_CARE_PROVIDER_SITE_OTHER)

## 2024-06-07 ENCOUNTER — Other Ambulatory Visit: Payer: Self-pay

## 2024-06-07 ENCOUNTER — Ambulatory Visit (INDEPENDENT_AMBULATORY_CARE_PROVIDER_SITE_OTHER): Admitting: Family Medicine

## 2024-06-07 VITALS — BP 162/88 | HR 43 | Ht 69.0 in | Wt 180.0 lb

## 2024-06-07 DIAGNOSIS — M25562 Pain in left knee: Secondary | ICD-10-CM

## 2024-06-07 DIAGNOSIS — G8929 Other chronic pain: Secondary | ICD-10-CM | POA: Diagnosis not present

## 2024-06-07 DIAGNOSIS — M1712 Unilateral primary osteoarthritis, left knee: Secondary | ICD-10-CM | POA: Diagnosis not present

## 2024-06-07 MED ORDER — TRIAMCINOLONE ACETONIDE 32 MG IX SRER
32.0000 mg | Freq: Once | INTRA_ARTICULAR | Status: AC
  Administered 2024-06-07: 32 mg via INTRA_ARTICULAR

## 2024-06-07 NOTE — Patient Instructions (Addendum)
 Thank you for coming in today.   Please get an Xray today before you leave   If this shot does not work please let me know.   Next step would be to talk with the radiologists about Geniculate Artery Embolization.

## 2024-06-07 NOTE — Progress Notes (Unsigned)
   LILLETTE Ileana Collet, PhD, LAT, ATC acting as a scribe for Artist Lloyd, MD.  Jennifer Yang is a 83 y.o. female who presents to [facility] at [facility] today for cont'd L knee pain. Pt was last seen by Dr. Lloyd on 02/19/23 and was given a repeat Zilretta  injection.   Today, pt reports prior Zilretta  injection lasted about 3-4wks. She is wondering if the type of injection medication needs switched.   Dx imaging: 06/04/22 L knee XR (done at [facility])   Pertinent review of systems: No fevers or chills  Relevant historical information: Orthostatic hypotension   Exam:  BP (!) 162/88   Pulse (!) 43   Ht 5' 9 (1.753 m)   Wt 180 lb (81.6 kg)   SpO2 99%   BMI 26.58 kg/m  General: Well Developed, well nourished, and in no acute distress.   MSK: Left knee mild effusion normal.  Otherwise normal motion.    Lab and Radiology Results   Zilretta  injection left knee Procedure: Real-time Ultrasound Guided Injection of left knee joint superior lateral patellar space Device: Philips Affiniti 50G Images permanently stored and available for review in PACS Verbal informed consent obtained.  Discussed risks and benefits of procedure. Warned about infection, hyperglycemia bleeding, damage to structures among others. Patient expresses understanding and agreement Time-out conducted.   Noted no overlying erythema, induration, or other signs of local infection.   Skin prepped in a sterile fashion.   Local anesthesia: Topical Ethyl chloride.   With sterile technique and under real time ultrasound guidance: Zilretta  32 mg injected into knee joint. Fluid seen entering the joint capsule.   Completed without difficulty   Advised to call if fevers/chills, erythema, induration, drainage, or persistent bleeding.   Images permanently stored and available for review in the ultrasound unit.  Impression: Technically successful ultrasound guided injection.    X-ray images left knee  obtained today personally and independently interpreted. Lateral DJD Await formal radiology review   Assessment and Plan: 83 y.o. female with chronic left knee pain due to osteoarthritis.  Historically Zilretta  injections have worked quite well.  The most recent injection in April did not work very well.  Plan for repeat Zilretta  injection trial again today.  If that does not work we did talk about genicular artery embolization or knee replacement.  She would be interested in proceeding to genicular artery embolization if not better.  We can place the referral to interventional radiology with a phone call or MyChart message.   PDMP not reviewed this encounter. Orders Placed This Encounter  Procedures   US  LIMITED JOINT SPACE STRUCTURES LOW LEFT(NO LINKED CHARGES)    Reason for Exam (SYMPTOM  OR DIAGNOSIS REQUIRED):   left knee pain    Preferred imaging location?:   [facility] Knee AP/LAT W/[place] Left    Standing Status:   Future    Number of Occurrences:   1    Expiration Date:   06/07/2025    Reason for Exam (SYMPTOM  OR DIAGNOSIS REQUIRED):   eval knee pain    Preferred imaging location?:   [facility]   Meds ordered this encounter  Medications   Triamcinolone  Acetonide (ZILRETTA ) intra-articular injection 32 mg     Discussed warning signs or symptoms. Please see discharge instructions. Patient expresses understanding.   The above documentation has been reviewed and is accurate and complete Artist Lloyd, M.D.

## 2024-06-09 ENCOUNTER — Telehealth: Payer: Self-pay

## 2024-06-09 NOTE — Telephone Encounter (Signed)
 Zilretta  authorized for left knee MEDICARE Coinsurance 80% Deductible $257 has met $257 OOP does not apply NO PA REQUIRED  Reference # Website 06/07/24  [employer] Covers the remaining 20% of coinsurance Deductible has to be met before coverage applies NO PA REQUIRED  Reference number [id number]

## 2024-06-13 ENCOUNTER — Telehealth: Payer: Self-pay

## 2024-06-13 ENCOUNTER — Ambulatory Visit: Payer: Self-pay | Admitting: Family Medicine

## 2024-06-13 NOTE — Telephone Encounter (Signed)
 Noted

## 2024-06-13 NOTE — Telephone Encounter (Signed)
 That lucency is a little bit of air that was injected along with the Zilretta  shot into the knee and does not represent an infection.  That is okay.

## 2024-06-13 NOTE — Progress Notes (Signed)
 Left knee x-ray shows arthritis severe in places.

## 2024-06-13 NOTE — Telephone Encounter (Signed)
 Radiology called to make you aware of the finding of pt's XR. See radiology report.

## 2024-06-20 ENCOUNTER — Other Ambulatory Visit: Payer: Self-pay

## 2024-06-20 ENCOUNTER — Telehealth: Payer: Self-pay | Admitting: Cardiology

## 2024-06-20 DIAGNOSIS — I480 Paroxysmal atrial fibrillation: Secondary | ICD-10-CM

## 2024-06-20 MED ORDER — APIXABAN 5 MG PO TABS: 5.0000 mg | ORAL_TABLET | Freq: Two times a day (BID) | ORAL | 5 refills | Status: DC

## 2024-06-20 NOTE — Telephone Encounter (Signed)
*  STAT* If patient is at the pharmacy, call can be transferred to refill team.   1. Which medications need to be refilled? (please list name of each medication and dose if known) apixaban  (ELIQUIS ) 5 MG TABS tablet    2. Would you like to learn more about the convenience, safety, & potential cost savings by using the [facility]? No   3. Are you open to using the [facility] (Type [facility].  ) No   4. Which pharmacy/location (including street and city if local pharmacy) is medication to be sent to?  [id number] - [place], [place] - [address]     5. Do they need a 30 day or 90 day supply? 30 day

## 2024-06-20 NOTE — Telephone Encounter (Signed)
 Prescription refill request for Eliquis  received. Indication:afib Last office visit:12/24 Scr:1.44  6/25 Age: 83 Weight:81.6  kg  Prescription refilled

## 2024-07-25 DIAGNOSIS — L814 Other melanin hyperpigmentation: Secondary | ICD-10-CM | POA: Diagnosis not present

## 2024-07-25 DIAGNOSIS — Z08 Encounter for follow-up examination after completed treatment for malignant neoplasm: Secondary | ICD-10-CM | POA: Diagnosis not present

## 2024-07-25 DIAGNOSIS — D492 Neoplasm of unspecified behavior of bone, soft tissue, and skin: Secondary | ICD-10-CM | POA: Diagnosis not present

## 2024-07-25 DIAGNOSIS — L820 Inflamed seborrheic keratosis: Secondary | ICD-10-CM | POA: Diagnosis not present

## 2024-07-25 DIAGNOSIS — Z85828 Personal history of other malignant neoplasm of skin: Secondary | ICD-10-CM | POA: Diagnosis not present

## 2024-07-25 DIAGNOSIS — D0439 Carcinoma in situ of skin of other parts of face: Secondary | ICD-10-CM | POA: Diagnosis not present

## 2024-07-25 DIAGNOSIS — L821 Other seborrheic keratosis: Secondary | ICD-10-CM | POA: Diagnosis not present

## 2024-07-29 DIAGNOSIS — Z23 Encounter for immunization: Secondary | ICD-10-CM | POA: Diagnosis not present

## 2024-08-02 DIAGNOSIS — H5712 Ocular pain, left eye: Secondary | ICD-10-CM | POA: Diagnosis not present

## 2024-08-10 DIAGNOSIS — L538 Other specified erythematous conditions: Secondary | ICD-10-CM | POA: Diagnosis not present

## 2024-08-10 DIAGNOSIS — Z08 Encounter for follow-up examination after completed treatment for malignant neoplasm: Secondary | ICD-10-CM | POA: Diagnosis not present

## 2024-08-10 DIAGNOSIS — Z85828 Personal history of other malignant neoplasm of skin: Secondary | ICD-10-CM | POA: Diagnosis not present

## 2024-08-10 DIAGNOSIS — L814 Other melanin hyperpigmentation: Secondary | ICD-10-CM | POA: Diagnosis not present

## 2024-08-10 DIAGNOSIS — L820 Inflamed seborrheic keratosis: Secondary | ICD-10-CM | POA: Diagnosis not present

## 2024-08-23 DIAGNOSIS — E1122 Type 2 diabetes mellitus with diabetic chronic kidney disease: Secondary | ICD-10-CM | POA: Diagnosis not present

## 2024-08-23 DIAGNOSIS — E785 Hyperlipidemia, unspecified: Secondary | ICD-10-CM | POA: Diagnosis not present

## 2024-08-23 LAB — LAB REPORT - SCANNED
A1c: 6
EGFR: 33

## 2024-08-26 DIAGNOSIS — D0439 Carcinoma in situ of skin of other parts of face: Secondary | ICD-10-CM | POA: Diagnosis not present

## 2024-08-29 ENCOUNTER — Encounter: Payer: Self-pay | Admitting: Podiatry

## 2024-08-29 ENCOUNTER — Ambulatory Visit: Admitting: Podiatry

## 2024-08-29 DIAGNOSIS — D689 Coagulation defect, unspecified: Secondary | ICD-10-CM | POA: Diagnosis not present

## 2024-08-29 DIAGNOSIS — M79674 Pain in right toe(s): Secondary | ICD-10-CM

## 2024-08-29 DIAGNOSIS — M79675 Pain in left toe(s): Secondary | ICD-10-CM

## 2024-08-29 DIAGNOSIS — B351 Tinea unguium: Secondary | ICD-10-CM | POA: Diagnosis not present

## 2024-08-29 DIAGNOSIS — L84 Corns and callosities: Secondary | ICD-10-CM | POA: Diagnosis not present

## 2024-08-29 NOTE — Progress Notes (Addendum)
 This patient returns to my office for at risk foot care.  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  This patient is unable to cut nails herself since the patient cannot reach her nails.These nails are painful walking and wearing shoes.  She says the callus on her feet are painful walking.This patient presents for at risk foot care today.  General Appearance  Alert, conversant and in no acute stress.  Vascular  Dorsalis pedis and posterior tibial  pulses are  weakly palpable  bilaterally.  Capillary return is within normal limits  bilaterally. Temperature is within normal limits  bilaterally.  Neurologic  Senn-Weinstein monofilament wire test within normal limits  bilaterally. Muscle power within normal limits bilaterally.  Nails Thick disfigured discolored nails with subungual debris  hallux nails bilaterally. No evidence of bacterial infection or drainage bilaterally.  Orthopedic  No limitations of motion  feet .  No crepitus or effusions noted.  HAV  B/L.  Contracted digits  B/L.  Plantar flexed fifth metatarsal  B/L.  Cavus foot type.  Skin  normotropic skin with no porokeratosis noted bilaterally.  No signs of infections or ulcers noted.   Callus sub 5th  B/L and callus sub 2 right foot.  Onychomycosis  Pain in right toes  Pain in left toes  Callus    Consent was obtained for treatment procedures.   Mechanical debridement of nails 1-5  bilaterally performed with a nail nipper.  Filed with dremel without incident.  Debride callus  sub 2 right foot.   Return office visit    3 months                  Told patient to return for periodic foot care and evaluation due to potential at risk complications.   Cordella Bold DPM

## 2024-08-30 DIAGNOSIS — M810 Age-related osteoporosis without current pathological fracture: Secondary | ICD-10-CM | POA: Diagnosis not present

## 2024-08-30 DIAGNOSIS — I700 Atherosclerosis of aorta: Secondary | ICD-10-CM | POA: Diagnosis not present

## 2024-08-30 DIAGNOSIS — C50911 Malignant neoplasm of unspecified site of right female breast: Secondary | ICD-10-CM | POA: Diagnosis not present

## 2024-08-30 DIAGNOSIS — I480 Paroxysmal atrial fibrillation: Secondary | ICD-10-CM | POA: Diagnosis not present

## 2024-08-30 DIAGNOSIS — I10 Essential (primary) hypertension: Secondary | ICD-10-CM | POA: Diagnosis not present

## 2024-08-30 DIAGNOSIS — E785 Hyperlipidemia, unspecified: Secondary | ICD-10-CM | POA: Diagnosis not present

## 2024-08-30 DIAGNOSIS — Z7901 Long term (current) use of anticoagulants: Secondary | ICD-10-CM | POA: Diagnosis not present

## 2024-08-30 DIAGNOSIS — E559 Vitamin D deficiency, unspecified: Secondary | ICD-10-CM | POA: Diagnosis not present

## 2024-08-30 DIAGNOSIS — N1831 Chronic kidney disease, stage 3a: Secondary | ICD-10-CM | POA: Diagnosis not present

## 2024-09-07 DIAGNOSIS — R350 Frequency of micturition: Secondary | ICD-10-CM | POA: Diagnosis not present

## 2024-09-07 DIAGNOSIS — N390 Urinary tract infection, site not specified: Secondary | ICD-10-CM | POA: Diagnosis not present

## 2024-09-07 DIAGNOSIS — R351 Nocturia: Secondary | ICD-10-CM | POA: Diagnosis not present

## 2024-09-28 NOTE — Progress Notes (Signed)
   LILLETTE Ileana Collet, PhD, LAT, ATC acting as a scribe for Artist Lloyd, MD.  Jennifer Yang is a 83 y.o. female who presents to [facility] at [facility] today for exacerbation of her L knee pain. Pt was last seen by Dr. Lloyd on 06/07/24 and was given a repeat Zilretta  injection.   Today, pt reports prior Zilretta  injection didn't last very long. She has been putting off f/u visit for as long as she could. She is unsure CSI vs Zilretta  today.   Dx testing: 06/07/24 L knee XR  Pertinent review of systems: No fevers or chills  Relevant historical information: Osteoporosis   Exam:  BP 138/86   Pulse (!) 49   Ht 5' 9 (1.753 m)   SpO2 100%   BMI 26.58 kg/m  General: Well Developed, well nourished, and in no acute distress.   MSK: Left knee genu valgus appearance Tender palpation lateral joint line. Decreased range of motion.    Lab and Radiology Results   Zilretta  injection right knee Procedure: Real-time Ultrasound Guided Injection of right knee joint superior lateral patellar space Device: Philips Affiniti 50G Images permanently stored and available for review in PACS Verbal informed consent obtained.  Discussed risks and benefits of procedure. Warned about infection, hyperglycemia bleeding, damage to structures among others. Patient expresses understanding and agreement Time-out conducted.   Noted no overlying erythema, induration, or other signs of local infection.   Skin prepped in a sterile fashion.   Local anesthesia: Topical Ethyl chloride.   With sterile technique and under real time ultrasound guidance: Zilretta  32 mg injected into knee joint. Fluid seen entering the joint capsule.   Completed without difficulty   Advised to call if fevers/chills, erythema, induration, drainage, or persistent bleeding.   Images permanently stored and available for review in the ultrasound unit.  Impression: Technically successful ultrasound guided injection.  Lot  number: [id number]      Assessment and Plan: 83 y.o. female with chronic left knee pain due to DJD.  Unfortunately typical conservative treatment options are no longer working.  Zilretta  is not lasting long enough.  This would be the third injection for Zilretta  where I am not optimistic.  I have recommended genicular artery embolization as she is not interested in a knee replacement.  She wants to talk to her cardiologist next month.  I do think it would be a good idea for her to proceed with this procedure or at least have a consultation with the radiologist that or does it.   PDMP not reviewed this encounter. Orders Placed This Encounter  Procedures   US  LIMITED JOINT SPACE STRUCTURES LOW LEFT(NO LINKED CHARGES)    Reason for Exam (SYMPTOM  OR DIAGNOSIS REQUIRED):   left knee pain    Preferred imaging location?:   [facility] ordered this encounter  Medications   Triamcinolone  Acetonide (ZILRETTA ) intra-articular injection 32 mg     Discussed warning signs or symptoms. Please see discharge instructions. Patient expresses understanding.   The above documentation has been reviewed and is accurate and complete Artist Lloyd, M.D.

## 2024-09-28 NOTE — Telephone Encounter (Signed)
 Coverage still applies same as below since patients insurance has not changed

## 2024-09-28 NOTE — Telephone Encounter (Signed)
 Please re-auth Zilretta , LEFT knee

## 2024-09-29 ENCOUNTER — Other Ambulatory Visit: Payer: Self-pay

## 2024-09-29 ENCOUNTER — Telehealth: Payer: Self-pay | Admitting: Family Medicine

## 2024-09-29 ENCOUNTER — Ambulatory Visit: Admitting: Family Medicine

## 2024-09-29 VITALS — BP 138/86 | HR 49 | Ht 69.0 in

## 2024-09-29 DIAGNOSIS — M1712 Unilateral primary osteoarthritis, left knee: Secondary | ICD-10-CM

## 2024-09-29 DIAGNOSIS — M25562 Pain in left knee: Secondary | ICD-10-CM

## 2024-09-29 DIAGNOSIS — L923 Foreign body granuloma of the skin and subcutaneous tissue: Secondary | ICD-10-CM | POA: Diagnosis not present

## 2024-09-29 DIAGNOSIS — G8929 Other chronic pain: Secondary | ICD-10-CM

## 2024-09-29 MED ORDER — TRIAMCINOLONE ACETONIDE 32 MG IX SRER
32.0000 mg | Freq: Once | INTRA_ARTICULAR | Status: AC
  Administered 2024-09-29: 32 mg via INTRA_ARTICULAR

## 2024-09-29 NOTE — Patient Instructions (Addendum)
 Thank you for coming in today.   You received an injection today. Seek immediate medical attention if the joint becomes red, extremely painful, or is oozing fluid.   Let me know what your cardiologist says about the genicular artery embolization procedure.  We can authorize the gel shots, just let me know.

## 2024-09-29 NOTE — Telephone Encounter (Signed)
 Patient called and did not receive her AVS. She would like it sent to her email. Please advise.

## 2024-09-30 NOTE — Telephone Encounter (Signed)
 Called pt, left VM advising that AVS can be viewed via MyChart or we can send by mail.

## 2024-10-15 ENCOUNTER — Other Ambulatory Visit: Payer: Self-pay | Admitting: Cardiology

## 2024-10-15 DIAGNOSIS — I10 Essential (primary) hypertension: Secondary | ICD-10-CM

## 2024-10-19 ENCOUNTER — Telehealth: Payer: Self-pay | Admitting: Cardiology

## 2024-10-19 DIAGNOSIS — I10 Essential (primary) hypertension: Secondary | ICD-10-CM

## 2024-10-19 MED ORDER — SPIRONOLACTONE 25 MG PO TABS: 25.0000 mg | ORAL_TABLET | Freq: Every morning | ORAL | 0 refills | Status: AC

## 2024-10-19 MED ORDER — AMLODIPINE BESYLATE 5 MG PO TABS: 5.0000 mg | ORAL_TABLET | Freq: Every day | ORAL | 0 refills | Status: AC

## 2024-10-19 NOTE — Telephone Encounter (Signed)
*  STAT* If patient is at the pharmacy, call can be transferred to refill team.   1. Which medications need to be refilled? (please list name of each medication and dose if known)   spironolactone  (ALDACTONE ) 25 MG tablet  amLODipine  (NORVASC ) 5 MG tablet    2. Would you like to learn more about the convenience, safety, & potential cost savings by using the [facility]? no   3. Are you open to using the [facility] (Type [facility]. ). no   4. Which pharmacy/location (including street and city if local pharmacy) is medication to be sent to? [id number] - [place], [place] - [address]     5. Do they need a 30 day or 90 day supply? 90 day   Pt is out of medication

## 2024-10-19 NOTE — Telephone Encounter (Signed)
 Requested Prescriptions   Signed Prescriptions Disp Refills   amLODipine  (NORVASC ) 5 MG tablet 90 tablet 0    Sig: Take 1 tablet (5 mg total) by mouth daily.    Authorizing Provider: LADONA HEINZ    Ordering User: Tiauna Whisnant  C   spironolactone  (ALDACTONE ) 25 MG tablet 90 tablet 0    Sig: Take 1 tablet (25 mg total) by mouth in the morning.    Authorizing Provider: LADONA HEINZ    Ordering User: WILFRED, Berneda Piccininni  C

## 2024-10-26 ENCOUNTER — Ambulatory Visit: Attending: Cardiology | Admitting: Cardiology

## 2024-10-26 ENCOUNTER — Encounter: Payer: Self-pay | Admitting: Cardiology

## 2024-10-26 VITALS — BP 120/60 | HR 45 | Resp 16 | Ht 69.0 in | Wt 170.0 lb

## 2024-10-26 DIAGNOSIS — R001 Bradycardia, unspecified: Secondary | ICD-10-CM | POA: Insufficient documentation

## 2024-10-26 DIAGNOSIS — I480 Paroxysmal atrial fibrillation: Secondary | ICD-10-CM | POA: Diagnosis present

## 2024-10-26 DIAGNOSIS — I10 Essential (primary) hypertension: Secondary | ICD-10-CM | POA: Diagnosis present

## 2024-10-26 DIAGNOSIS — N1832 Chronic kidney disease, stage 3b: Secondary | ICD-10-CM | POA: Insufficient documentation

## 2024-10-26 DIAGNOSIS — Z01810 Encounter for preprocedural cardiovascular examination: Secondary | ICD-10-CM | POA: Diagnosis not present

## 2024-10-26 MED ORDER — APIXABAN 5 MG PO TABS: 5.0000 mg | ORAL_TABLET | Freq: Two times a day (BID) | ORAL | 3 refills | Status: AC

## 2024-10-26 NOTE — Patient Instructions (Signed)
 Medication Instructions:  START Eliquis  5mg   Take 1 tablet (5 mg total) by mouth 2 (two) times daily.  *If you need a refill on your cardiac medications before your next appointment, please call your pharmacy*  Follow-Up: At [facility], you and your health needs are our priority.  As part of our continuing mission to provide you with exceptional heart care, our providers are all part of one team.  This team includes your primary Cardiologist (physician) and Advanced Practice Providers or APPs (Physician Assistants and Nurse Practitioners) who all work together to provide you with the care you need, when you need it.  Your next appointment:   1 year(s)  Provider:   Gordy Bergamo, MD

## 2024-10-26 NOTE — Progress Notes (Signed)
 Cardiology Office Note:  .   Date:  10/26/2024  ID:  Jennifer Yang, DOB [birth year], MRN [id number] PCP: Clarice Nottingham, MD  [facility] Providers Cardiologist:  Gordy Bergamo, MD   History of Present Illness: .   Jennifer Yang is a 83 y.o.  Caucasian female patient with hypertension, hyperlipidemia, prediabetes mellitus, stage IIIb chronic kidney disease, had a accidental fall and had left femoral fracture in the last week of July 2022 and was admitted to [facility], [place] and had new onset A. fib with RVR, spontaneously converted back to sinus rhythm but had a second episode 09/2282022, again brief and now on chronic anticoagulation with Eliquis  due to  high embolic risk.  Her CHA2DS2-VASc rescore was 6 with annual risk of stroke of 9.7%.  Except for occasional episodes of palpitations which are far and few, she remains asymptomatic and continues to remain very active.  She is wondering whether she should have left knee geniculate artery implantation due to arthritis as she does not want to go through knee replacement.  Accompanied by her husband.    Discussed the use of AI scribe software for clinical note transcription with the patient, who gave verbal consent to proceed.  History of Present Illness Jennifer Yang is an 83 year old female with atrial fibrillation who presents for follow-up of her condition.  She has had two brief atrial fibrillation episodes in the past year, each lasting about five to ten minutes, with a sensation of heart racing that she links to anxiety. The last episode improved after she took Xanax . She denies bleeding or black stools. She takes low-dose metoprolol  succinate and Eliquis  for atrial fibrillation.  She has mild to moderate chronic kidney disease with a stable eGFR of 36 over the past year. Her blood pressure is treated with losartan  100 mg daily and amlodipine  5 mg, with instructions to hold losartan  if she is dehydrated from illness.  She takes  Crestor  5 mg daily with an LDL of 93 and HDL of 52. She also takes spironolactone  for blood pressure and swelling, which has been effective. She has no bleeding or black stools.  Cardiac Studies relevent.    Outside Echocardiogram 07/16/2021: Normal LV size and normal LV systolic function, EF 70%.  Mild LVH. Normal RV systolic function and normal size. Trivial pericardial effusion  Labs   Recent Labs    05/11/24 0941  NA 139  K 5.0  CL 106  CO2 23  GLUCOSE 104*  BUN 21  CREATININE 1.44*  CALCIUM  9.8  GFRNONAA 36*    Lab Results  Component Value Date   ALT 10 05/11/2024   AST 19 05/11/2024   ALKPHOS 38 05/11/2024   BILITOT 1.0 05/11/2024      Latest Ref Rng & Units 05/11/2024    9:41 AM 05/12/2023    9:28 AM 05/13/2022   10:00 AM  CBC  WBC 4.0 - 10.5 K/uL 6.0  5.7  5.8   Hemoglobin 12.0 - 15.0 g/dL 87.7  87.8  87.8   Hematocrit 36.0 - 46.0 % 37.9  38.1  37.6   Platelets 150 - 400 K/uL 160  161  168    Care everywhere/Faxed External Labs:  Labs 08/23/2024:  Total cholesterol 166, triglycerides 120, HDL 52, LDL 93.  BUN 26, creatinine 1.540.  eGFR 33 mL.  Potassium 5.0.  A1c 6.0%.  Hb 12.2.  ROS  Review of Systems  Cardiovascular:  Positive for palpitations (occasional) and syncope. Negative  for chest pain, dyspnea on exertion and leg swelling.   Physical Exam:   VS:  BP 120/60 (BP Location: Left Arm, Patient Position: Sitting, Cuff Size: Normal)   Pulse (!) 45   Resp 16   Ht 5' 9 (1.753 m)   Wt 170 lb (77.1 kg)   SpO2 99%   BMI 25.10 kg/m    Wt Readings from Last 3 Encounters:  10/26/24 170 lb (77.1 kg)  06/07/24 180 lb (81.6 kg)  05/11/24 180 lb 12.8 oz (82 kg)    BP Readings from Last 3 Encounters:  10/26/24 120/60  09/29/24 138/86  06/07/24 (!) 162/88   Physical Exam Neck:     Vascular: No carotid bruit or JVD.  Cardiovascular:     Rate and Rhythm: Normal rate and regular rhythm.     Pulses: Intact distal pulses.     Heart sounds: Normal  heart sounds. No murmur heard.    No gallop.  Pulmonary:     Effort: Pulmonary effort is normal.     Breath sounds: Normal breath sounds.  Abdominal:     General: Bowel sounds are normal.     Palpations: Abdomen is soft.  Musculoskeletal:     Right lower leg: No edema.     Left lower leg: No edema.    EKG:    EKG Interpretation Date/Time:  Wednesday October 26 2024 09:13:09 EST Ventricular Rate:  45 PR Interval:  198 QRS Duration:  96 QT Interval:  438 QTC Calculation: 378 R Axis:   -25  Text Interpretation: EKG 11/05/2024: Sinus bradycardia at rate of 45 bpm otherwise normal EKG.  Compared to 11/05/2023, prior heart rate was 50 bpm. Confirmed by [place] Walthall, Jagadeesh [phone]) on 10/26/2024 9:16:58 AM    ASSESSMENT AND PLAN: .      ICD-10-CM   1. Paroxysmal atrial fibrillation (HCC)  I48.0 EKG 12-Lead    apixaban  (ELIQUIS ) 5 MG TABS tablet    2. Bradycardia by electrocardiogram  R00.1     3. Stage 3b chronic kidney disease (HCC)  N18.32     4. Primary hypertension  I10     5. Pre-operative cardiovascular examination  Z01.810      Assessment & Plan Preoperative cardiovascular risk assessment Low cardiac risk for genicular artery embolization or knee replacement. EKG and physical exam are normal. No symptoms from low heart rate. Well protected from cardiac events with current medications. - Proceed with genicular artery embolization or knee replacement as per orthopedic recommendation. - Stop Eliquis  for 2-3 days prior to procedure and resume as instructed post-procedure.  Paroxysmal atrial fibrillation Episodes are brief, lasting 5-10 minutes, and occur approximately twice a year. No symptoms of prolonged episodes. Risk of stroke is 9.7% per year due to age and hypertension. Currently on Eliquis  for stroke prevention and metoprolol  succinate to maintain rhythm. - Continue Eliquis  for stroke prevention. - Continue metoprolol  succinate to maintain rhythm. - Monitor for any  prolonged episodes of AFib.  Bradycardia Heart rate is low but asymptomatic. EKG shows sinus rhythm. No need to adjust metoprolol  as it is well tolerated. - Continue current dose of metoprolol  succinate 25 mg daily.  Stage 3b chronic kidney disease EGFR is 36, consistent with stage 3b chronic kidney disease. Kidney function is stable with no change in creatinine clearance or EGFR over the past year. - Continue losartan  100 mg once daily. - Hold losartan  if experiencing dehydration or significant fluid loss.  Primary hypertension Blood pressure is well controlled with amlodipine  5  mg and losartan  100 mg once daily. No changes in kidney function or blood pressure control. - Continue amlodipine  5 mg daily. - Continue losartan  100 mg once daily. - Hold losartan  if experiencing dehydration or significant fluid loss.   Follow up: 1 Year for PAF, Jennifer Yang and CRF  Signed,  Gordy Bergamo, MD, [facility] 10/26/2024, 2:10 PM [facility] [address] [place], [place] [address] Phone: [phone]. Fax:  [phone]

## 2024-10-27 DIAGNOSIS — Z961 Presence of intraocular lens: Secondary | ICD-10-CM | POA: Diagnosis not present

## 2024-10-27 DIAGNOSIS — H353132 Nonexudative age-related macular degeneration, bilateral, intermediate dry stage: Secondary | ICD-10-CM | POA: Diagnosis not present

## 2024-10-27 DIAGNOSIS — H26491 Other secondary cataract, right eye: Secondary | ICD-10-CM | POA: Diagnosis not present

## 2024-11-18 ENCOUNTER — Other Ambulatory Visit: Payer: Self-pay | Admitting: Cardiology

## 2024-11-18 DIAGNOSIS — I10 Essential (primary) hypertension: Secondary | ICD-10-CM

## 2024-11-18 MED ORDER — HYDRALAZINE HCL 50 MG PO TABS: 25.0000 mg | ORAL_TABLET | ORAL | 3 refills | Status: AC

## 2024-11-28 ENCOUNTER — Ambulatory Visit: Admitting: Podiatry

## 2024-12-21 ENCOUNTER — Ambulatory Visit: Admitting: Podiatry

## 2025-01-04 ENCOUNTER — Ambulatory Visit: Admitting: Podiatry

## 2025-05-10 ENCOUNTER — Ambulatory Visit: Admitting: Family

## 2025-05-10 ENCOUNTER — Inpatient Hospital Stay
